# Patient Record
Sex: Female | Born: 1935
Health system: Southern US, Community
[De-identification: ages and names within clinical notes are randomized; demographics above are authoritative.]

## PROBLEM LIST (undated history)

## (undated) DIAGNOSIS — K8020 Calculus of gallbladder without cholecystitis without obstruction: Secondary | ICD-10-CM

## (undated) DIAGNOSIS — I6529 Occlusion and stenosis of unspecified carotid artery: Secondary | ICD-10-CM

## (undated) DIAGNOSIS — K37 Unspecified appendicitis: Secondary | ICD-10-CM

## (undated) DIAGNOSIS — I639 Cerebral infarction, unspecified: Secondary | ICD-10-CM

## (undated) DIAGNOSIS — H548 Legal blindness, as defined in USA: Secondary | ICD-10-CM

## (undated) DIAGNOSIS — I719 Aortic aneurysm of unspecified site, without rupture: Secondary | ICD-10-CM

## (undated) DIAGNOSIS — R011 Cardiac murmur, unspecified: Secondary | ICD-10-CM

## (undated) DIAGNOSIS — K219 Gastro-esophageal reflux disease without esophagitis: Secondary | ICD-10-CM

## (undated) DIAGNOSIS — G459 Transient cerebral ischemic attack, unspecified: Secondary | ICD-10-CM

## (undated) DIAGNOSIS — M1990 Unspecified osteoarthritis, unspecified site: Secondary | ICD-10-CM

## (undated) DIAGNOSIS — N189 Chronic kidney disease, unspecified: Secondary | ICD-10-CM

## (undated) DIAGNOSIS — D689 Coagulation defect, unspecified: Secondary | ICD-10-CM

## (undated) DIAGNOSIS — F419 Anxiety disorder, unspecified: Secondary | ICD-10-CM

## (undated) DIAGNOSIS — I10 Essential (primary) hypertension: Secondary | ICD-10-CM

## (undated) DIAGNOSIS — M810 Age-related osteoporosis without current pathological fracture: Secondary | ICD-10-CM

## (undated) DIAGNOSIS — F329 Major depressive disorder, single episode, unspecified: Secondary | ICD-10-CM

## (undated) DIAGNOSIS — K648 Other hemorrhoids: Secondary | ICD-10-CM

## (undated) DIAGNOSIS — K589 Irritable bowel syndrome without diarrhea: Secondary | ICD-10-CM

## (undated) DIAGNOSIS — H269 Unspecified cataract: Secondary | ICD-10-CM

## (undated) DIAGNOSIS — H6980 Other specified disorders of Eustachian tube, unspecified ear: Secondary | ICD-10-CM

## (undated) DIAGNOSIS — T7840XA Allergy, unspecified, initial encounter: Secondary | ICD-10-CM

## (undated) DIAGNOSIS — F32A Depression, unspecified: Secondary | ICD-10-CM

## (undated) DIAGNOSIS — H6990 Unspecified Eustachian tube disorder, unspecified ear: Secondary | ICD-10-CM

## (undated) HISTORY — DX: Age-related osteoporosis without current pathological fracture: M81.0

## (undated) HISTORY — DX: Coagulation defect, unspecified: D68.9

## (undated) HISTORY — DX: Cerebral infarction, unspecified: I63.9

## (undated) HISTORY — PX: WRIST SURGERY: SHX841

## (undated) HISTORY — DX: Unspecified osteoarthritis, unspecified site: M19.90

## (undated) HISTORY — DX: Unspecified cataract: H26.9

## (undated) HISTORY — PX: CHOLECYSTECTOMY: SHX55

## (undated) HISTORY — PX: SHOULDER SURGERY: SHX246

## (undated) HISTORY — DX: Allergy, unspecified, initial encounter: T78.40XA

## (undated) HISTORY — PX: ABDOMINAL HYSTERECTOMY: SHX81

## (undated) HISTORY — PX: EYE SURGERY: SHX253

## (undated) HISTORY — PX: APPENDECTOMY: SHX54

## (undated) HISTORY — PX: OTHER SURGICAL HISTORY: SHX169

---

## 1997-05-18 ENCOUNTER — Ambulatory Visit (HOSPITAL_BASED_OUTPATIENT_CLINIC_OR_DEPARTMENT_OTHER): Admission: RE | Admit: 1997-05-18 | Discharge: 1997-05-18 | Payer: Self-pay | Admitting: Urology

## 1997-07-04 ENCOUNTER — Ambulatory Visit (HOSPITAL_COMMUNITY): Admission: RE | Admit: 1997-07-04 | Discharge: 1997-07-04 | Payer: Self-pay | Admitting: Internal Medicine

## 1997-10-02 ENCOUNTER — Encounter: Admission: RE | Admit: 1997-10-02 | Discharge: 1997-12-31 | Payer: Self-pay | Admitting: Internal Medicine

## 1998-03-06 ENCOUNTER — Encounter: Payer: Self-pay | Admitting: Internal Medicine

## 1998-03-06 ENCOUNTER — Ambulatory Visit (HOSPITAL_COMMUNITY): Admission: RE | Admit: 1998-03-06 | Discharge: 1998-03-06 | Payer: Self-pay | Admitting: Internal Medicine

## 1998-12-21 ENCOUNTER — Encounter: Payer: Self-pay | Admitting: Internal Medicine

## 1998-12-21 ENCOUNTER — Ambulatory Visit (HOSPITAL_COMMUNITY): Admission: RE | Admit: 1998-12-21 | Discharge: 1998-12-21 | Payer: Self-pay | Admitting: Internal Medicine

## 1999-01-09 ENCOUNTER — Ambulatory Visit (HOSPITAL_COMMUNITY): Admission: RE | Admit: 1999-01-09 | Discharge: 1999-01-09 | Payer: Self-pay | Admitting: Specialist

## 1999-01-09 ENCOUNTER — Encounter: Payer: Self-pay | Admitting: Specialist

## 1999-01-24 ENCOUNTER — Ambulatory Visit (HOSPITAL_COMMUNITY): Admission: RE | Admit: 1999-01-24 | Discharge: 1999-01-24 | Payer: Self-pay | Admitting: Specialist

## 1999-01-24 ENCOUNTER — Encounter: Payer: Self-pay | Admitting: Specialist

## 1999-03-08 ENCOUNTER — Emergency Department (HOSPITAL_COMMUNITY): Admission: EM | Admit: 1999-03-08 | Discharge: 1999-03-08 | Payer: Self-pay | Admitting: Emergency Medicine

## 1999-08-29 ENCOUNTER — Encounter: Admission: RE | Admit: 1999-08-29 | Discharge: 1999-08-29 | Payer: Self-pay | Admitting: Internal Medicine

## 1999-08-29 ENCOUNTER — Encounter: Payer: Self-pay | Admitting: Internal Medicine

## 2000-08-30 ENCOUNTER — Encounter: Admission: RE | Admit: 2000-08-30 | Discharge: 2000-08-30 | Payer: Self-pay | Admitting: Internal Medicine

## 2000-08-30 ENCOUNTER — Encounter: Payer: Self-pay | Admitting: Internal Medicine

## 2001-08-31 ENCOUNTER — Encounter: Payer: Self-pay | Admitting: Internal Medicine

## 2001-08-31 ENCOUNTER — Encounter: Admission: RE | Admit: 2001-08-31 | Discharge: 2001-08-31 | Payer: Self-pay | Admitting: Internal Medicine

## 2002-01-19 ENCOUNTER — Emergency Department (HOSPITAL_COMMUNITY): Admission: EM | Admit: 2002-01-19 | Discharge: 2002-01-19 | Payer: Self-pay | Admitting: Emergency Medicine

## 2002-09-14 ENCOUNTER — Encounter: Admission: RE | Admit: 2002-09-14 | Discharge: 2002-09-14 | Payer: Self-pay | Admitting: Internal Medicine

## 2002-09-14 ENCOUNTER — Encounter: Payer: Self-pay | Admitting: Internal Medicine

## 2003-02-03 HISTORY — PX: ABDOMINAL AORTIC ANEURYSM REPAIR: SHX42

## 2003-03-14 ENCOUNTER — Ambulatory Visit (HOSPITAL_COMMUNITY): Admission: RE | Admit: 2003-03-14 | Discharge: 2003-03-14 | Payer: Self-pay | Admitting: Vascular Surgery

## 2003-04-12 ENCOUNTER — Ambulatory Visit (HOSPITAL_COMMUNITY): Admission: RE | Admit: 2003-04-12 | Discharge: 2003-04-12 | Payer: Self-pay | Admitting: Cardiology

## 2003-04-16 ENCOUNTER — Encounter (INDEPENDENT_AMBULATORY_CARE_PROVIDER_SITE_OTHER): Payer: Self-pay | Admitting: Specialist

## 2003-04-16 ENCOUNTER — Inpatient Hospital Stay (HOSPITAL_COMMUNITY): Admission: RE | Admit: 2003-04-16 | Discharge: 2003-04-27 | Payer: Self-pay | Admitting: Vascular Surgery

## 2003-10-30 ENCOUNTER — Encounter: Admission: RE | Admit: 2003-10-30 | Discharge: 2003-10-30 | Payer: Self-pay | Admitting: Internal Medicine

## 2004-07-31 IMAGING — NM NM MYOCAR MULTI W/ SPECT
1 series · 6 of 6 positions shown · non-contrast
Comparison: Prior thallium SPECT study dated 02/28/06.

CLINICAL DATA: Patient requires preoperative cardiac clearance for abdominal aortic aneurysm repair and resection. 
 NUCLEAR MEDICINE MYOCARDIAL PERFUSION EJECTION FRACTION CALCULATION, WALL MOTION STUDY, AND MULTIPLE SPECT IMAGING WITH REST AND PERSANTINE STRESS

[Series 1: cr cardiolite low dose · 6.79mm/px · 6 of 64 frames shown]
[frame 6/64]
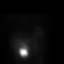
[frame 16/64]
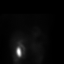
[frame 27/64]
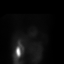
[frame 38/64]
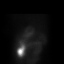
[frame 48/64]
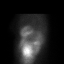
[frame 59/64]
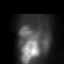

[6 of 6 positions shown; findings below may reference images not displayed]

Radiopharmaceuticals:  10 mCi 77mSc Sestamibi IV at rest and 30 mCi 77mSc Sestamibi IV with Persantine stress. 
 EJECTION FRACTION CALCULATION
 Utilizing gated data, the end diastolic volume is estimated to be 64 cc and the end systolic volume 17 cc.  The calculated ejection fraction is 72 percent. 
 IMPRESSION
 QGS LVEF of 72 percent.
 WALL MOTION ANALYSIS
 The gated study shows normal left ventricular wall motion. 
 IMPRESSION
 Normal wall motion study. 
 MULTIPLE SPECT IMAGING WITH REST AND PERSANTINE STRESS
 Spect images show no evidence of inducible ischemia with administration of Persantine.  No fixed scar is seen. 
 IMPRESSION
 Normal SPECT study demonstrating no evidence of ischemia.

## 2004-08-04 IMAGING — CR DG CHEST 1V PORT
1 series · 1 of 1 positions shown · non-contrast
Comparison: none

CLINICAL DATA: Status post abdominal aortic repair. 
 CHEST, PORTABLE, ONE VIEW
 AP view of the chest made with the portable apparatus on 04/16/2003 is compared with a prior study of 03/14/2003. Since the previous study, Swan-Ganz catheter has been inserted with the tip in the right main pulmonary artery.  Nasogastric tube is thought to be in satisfactory position.  The heart, pulmonary vascularity and lung fields are thought to be unchanged since previous study.  
 IMPRESSION
 Swan-Ganz catheter is thought to be in satisfactory position without evidence of pneumothorax or other acute changes in the chest.

[view not recorded]
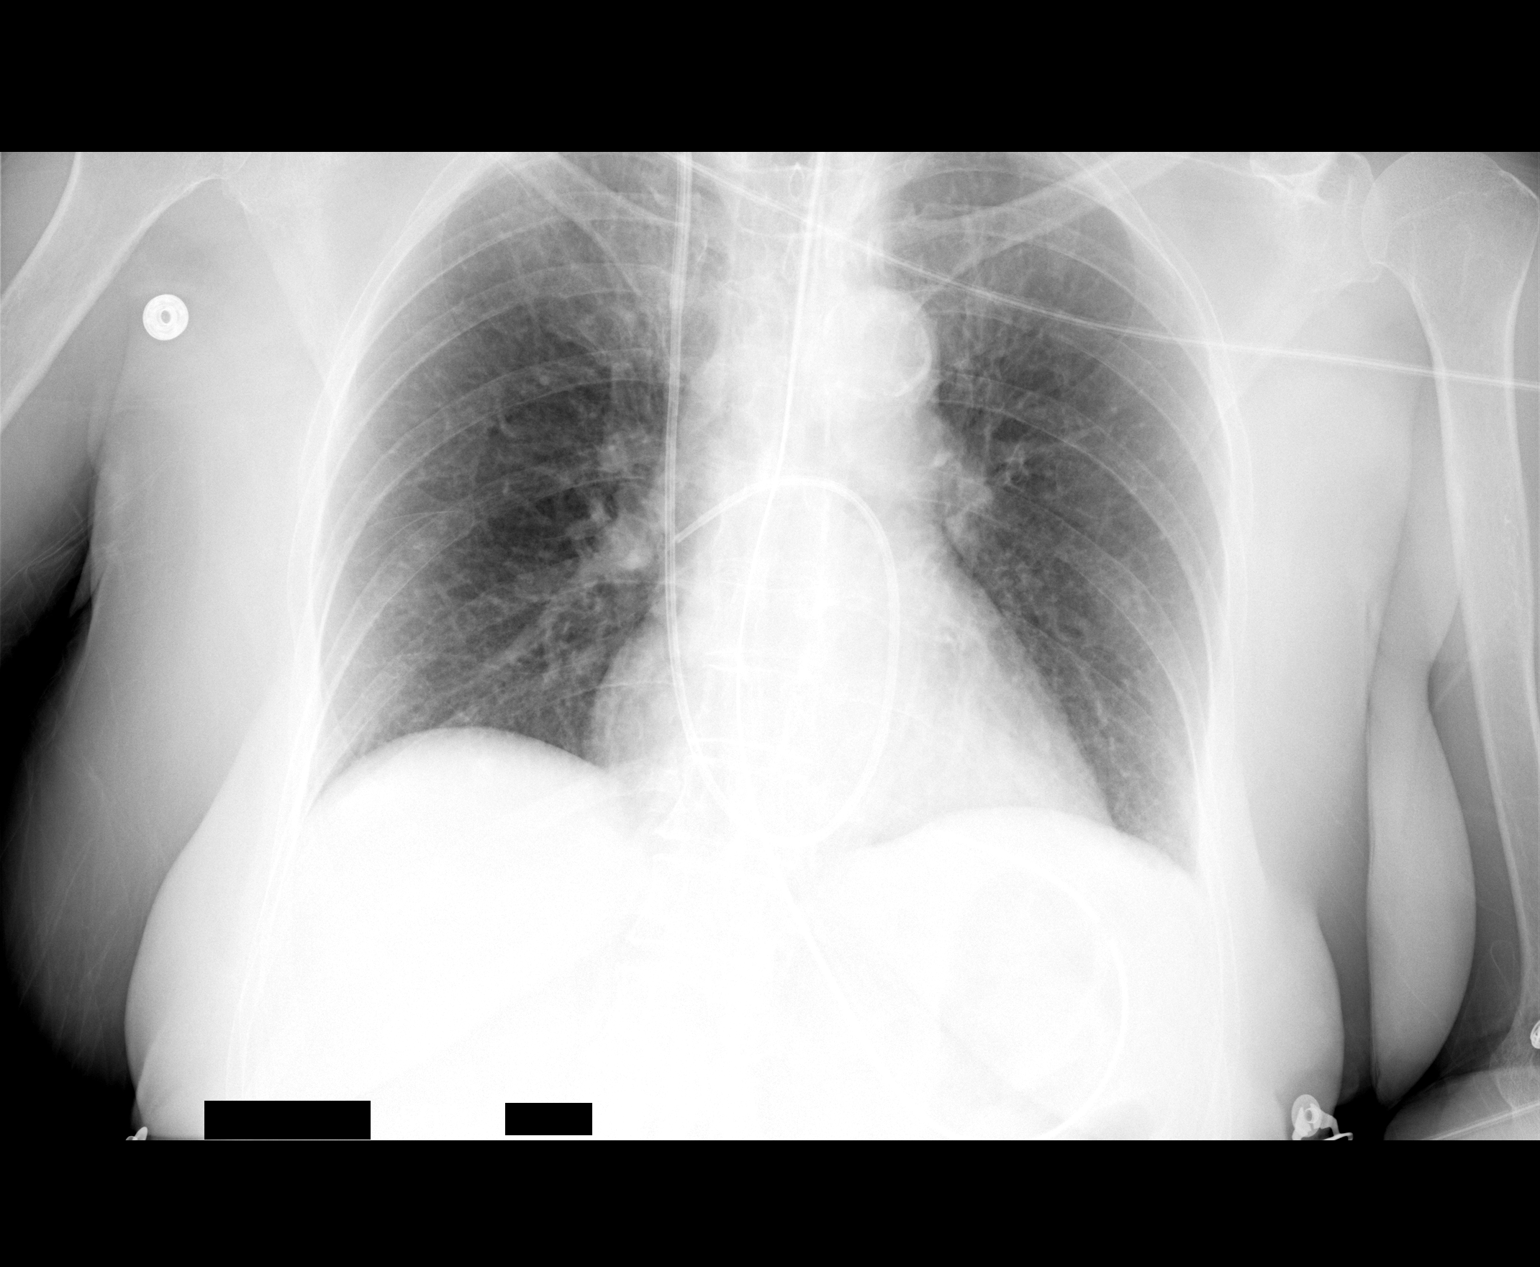

[1 of 1 positions shown; findings below may reference images not displayed]

## 2004-08-08 IMAGING — CR DG ABDOMEN 1V
1 series · 1 of 1 positions shown · non-contrast
Comparison: 04/19/03.

CLINICAL DATA: 76-year-old female, abdominal aortic aneurysm repair.  
 SINGLE VIEW ABDOMEN

[view not recorded]
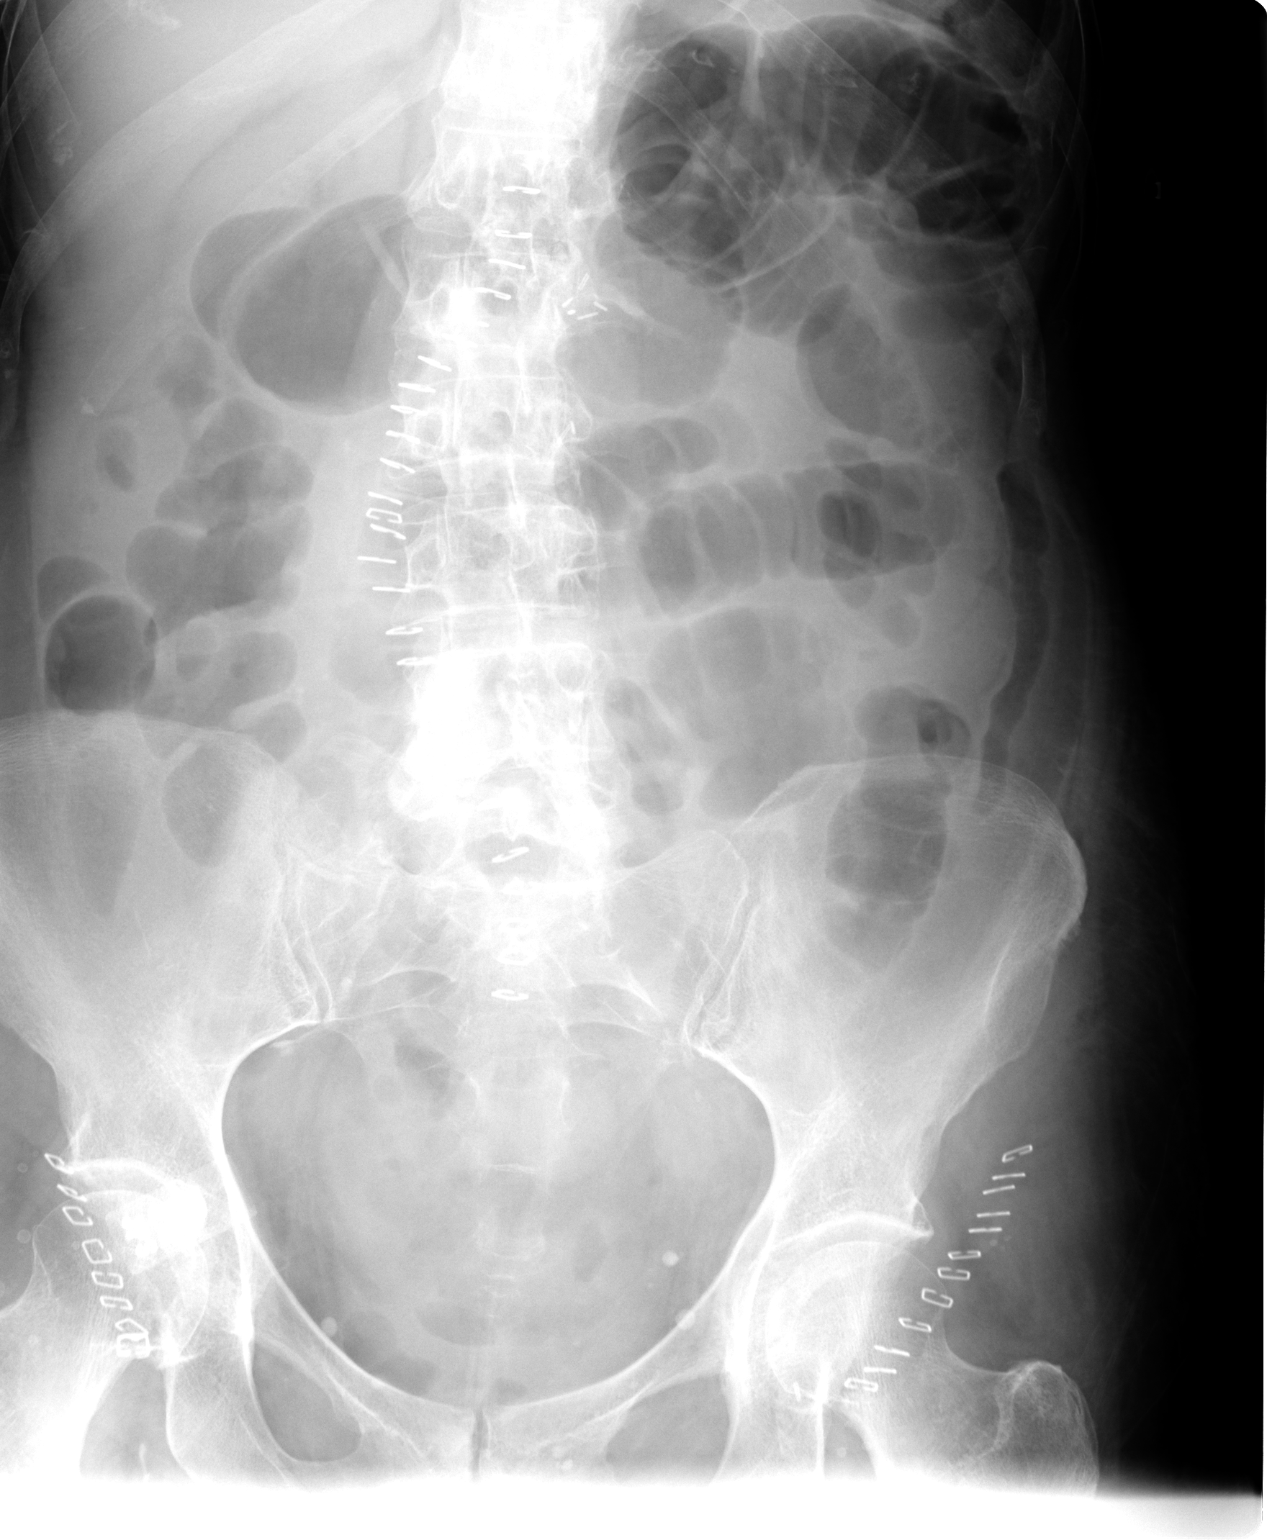

[1 of 1 positions shown; findings below may reference images not displayed]

FINDINGS: Portable view of the abdomen on 04/20/03, [DATE].
 There is mild gaseous distention of the small bowel.  Air is evident in the colon on the right side.  Left colonic air is faintly visible.  The left colon remains decompressed.  Overall, the appearance of the gaseous distention is similar to the supine radiograph of 04/19/03.
IMPRESSION: Mild gaseous distention of small and large bowel, probably consistent with a mild ileus.

## 2004-11-19 ENCOUNTER — Encounter: Admission: RE | Admit: 2004-11-19 | Discharge: 2004-11-19 | Payer: Self-pay | Admitting: Internal Medicine

## 2005-11-23 ENCOUNTER — Encounter: Admission: RE | Admit: 2005-11-23 | Discharge: 2005-11-23 | Payer: Self-pay | Admitting: Internal Medicine

## 2007-01-18 ENCOUNTER — Encounter: Admission: RE | Admit: 2007-01-18 | Discharge: 2007-01-18 | Payer: Self-pay | Admitting: Internal Medicine

## 2007-02-28 ENCOUNTER — Ambulatory Visit (HOSPITAL_COMMUNITY): Admission: RE | Admit: 2007-02-28 | Discharge: 2007-02-28 | Payer: Self-pay | Admitting: Internal Medicine

## 2007-04-28 ENCOUNTER — Encounter (INDEPENDENT_AMBULATORY_CARE_PROVIDER_SITE_OTHER): Payer: Self-pay | Admitting: General Surgery

## 2007-04-28 ENCOUNTER — Ambulatory Visit (HOSPITAL_COMMUNITY): Admission: RE | Admit: 2007-04-28 | Discharge: 2007-04-29 | Payer: Self-pay | Admitting: General Surgery

## 2007-10-26 ENCOUNTER — Ambulatory Visit (HOSPITAL_COMMUNITY): Admission: RE | Admit: 2007-10-26 | Discharge: 2007-10-26 | Payer: Self-pay | Admitting: *Deleted

## 2007-10-26 ENCOUNTER — Encounter (INDEPENDENT_AMBULATORY_CARE_PROVIDER_SITE_OTHER): Payer: Self-pay | Admitting: *Deleted

## 2008-02-27 ENCOUNTER — Encounter: Admission: RE | Admit: 2008-02-27 | Discharge: 2008-02-27 | Payer: Self-pay | Admitting: Internal Medicine

## 2008-03-05 ENCOUNTER — Encounter: Admission: RE | Admit: 2008-03-05 | Discharge: 2008-03-05 | Payer: Self-pay | Admitting: Internal Medicine

## 2008-08-23 ENCOUNTER — Encounter: Admission: RE | Admit: 2008-08-23 | Discharge: 2008-08-23 | Payer: Self-pay | Admitting: *Deleted

## 2008-09-17 ENCOUNTER — Encounter: Admission: RE | Admit: 2008-09-17 | Discharge: 2008-09-17 | Payer: Self-pay | Admitting: Internal Medicine

## 2010-01-06 IMAGING — US US ABDOMEN COMPLETE
1 series · 13 of 25 positions shown · non-contrast
Comparison: CT 08/23/2008.  Ultrasound 02/28/2007

CLINICAL DATA: Followup hepatic cyst.

COMPLETE ABDOMINAL ULTRASOUND

[Series 1: us abdomen complete · 0.24mm/px · 13 of 72 slices shown]
[im 1/72]
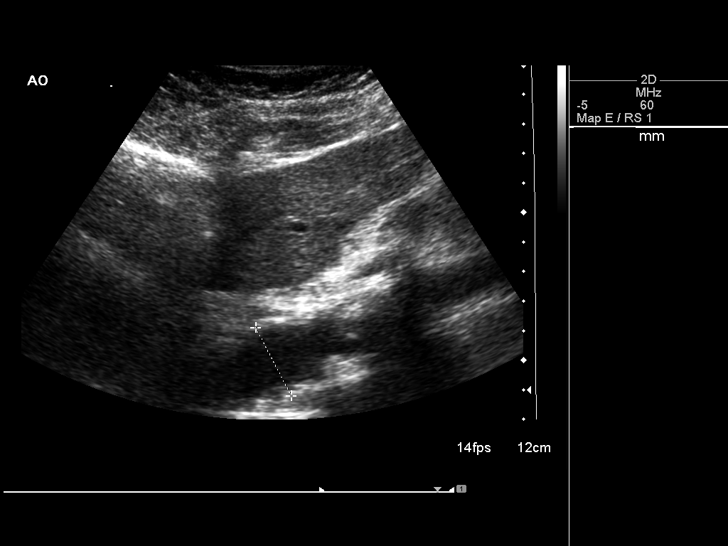
[im 6/72]
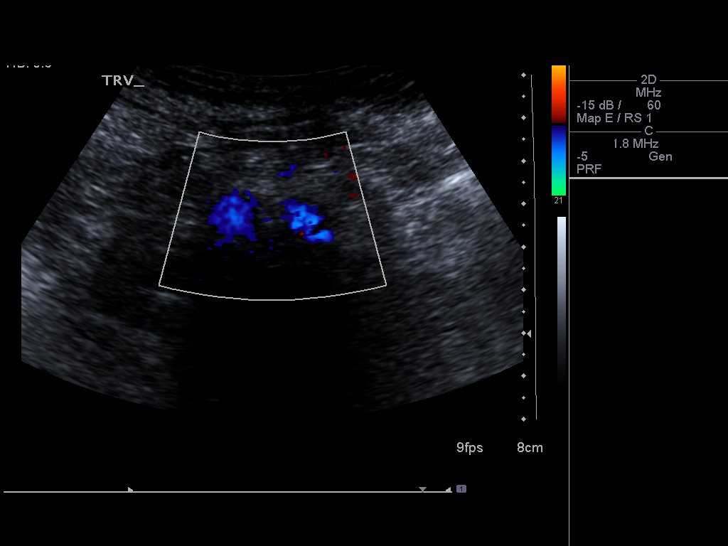
[im 12/72]
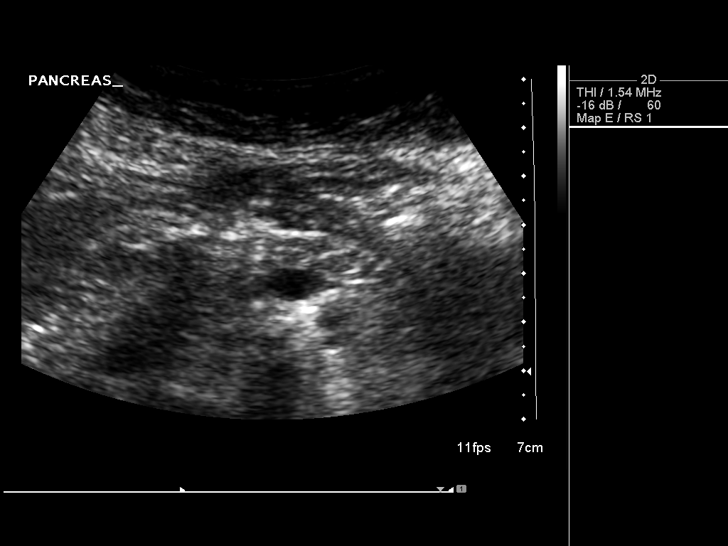
[im 18/72]
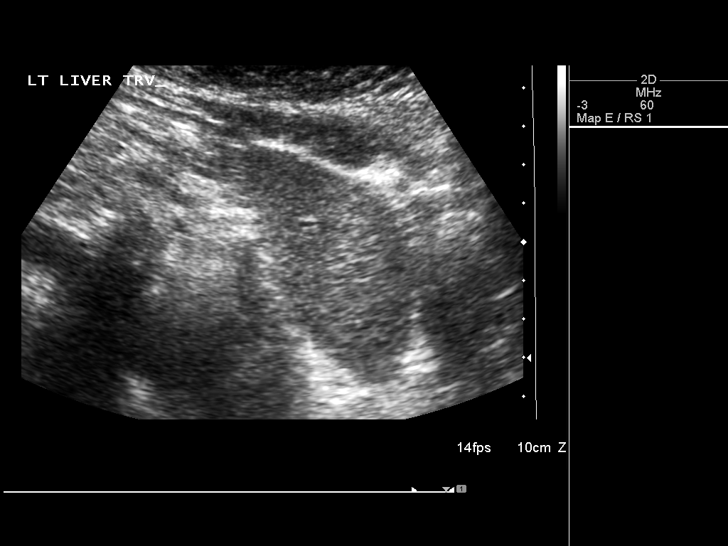
[im 24/72]
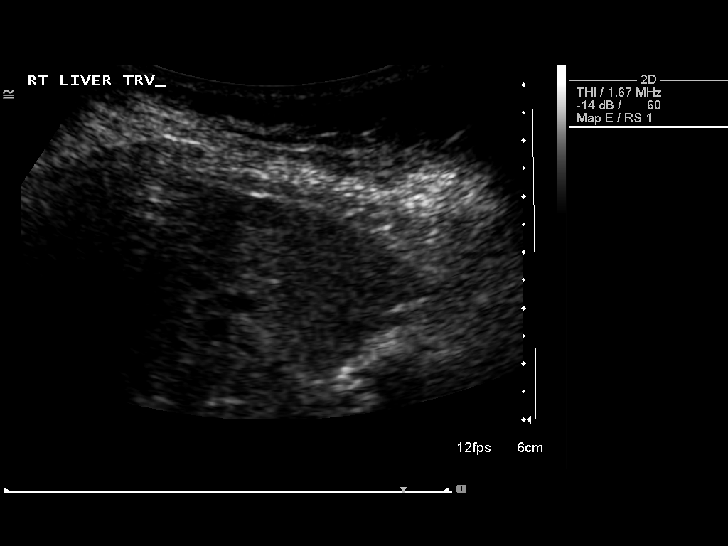
[im 30/72]
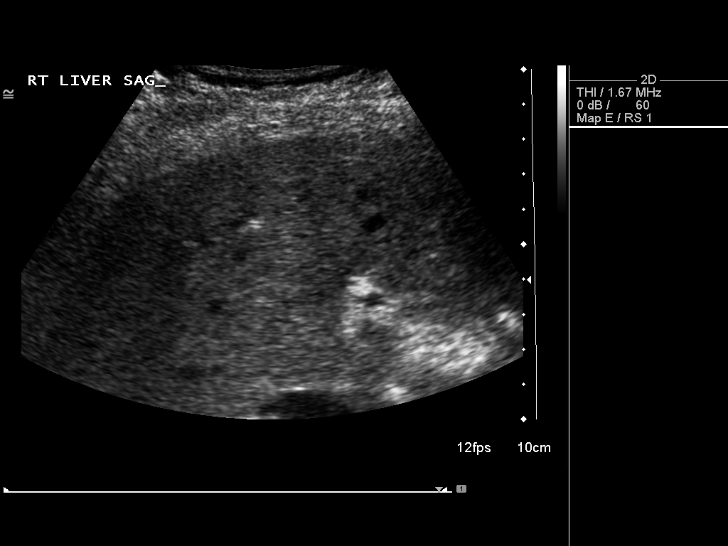
[im 36/72]
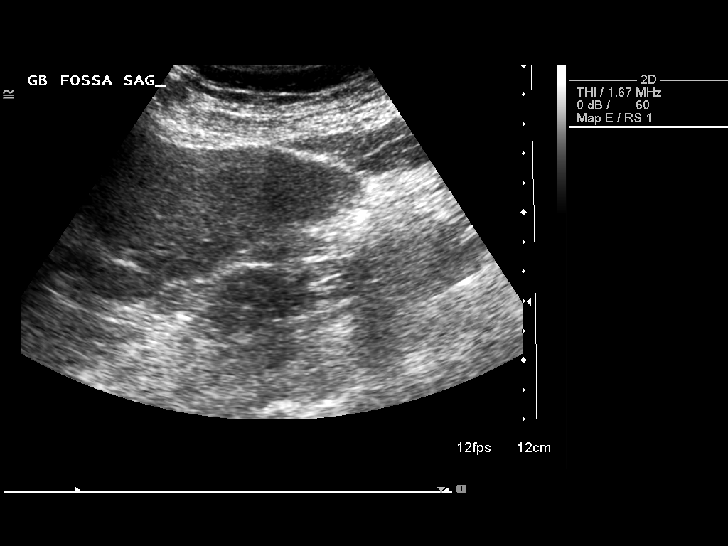
[im 42/72]
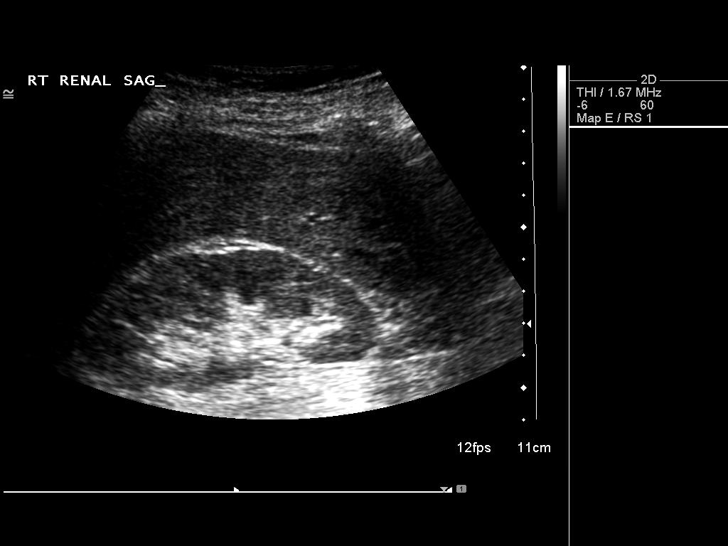
[im 48/72]
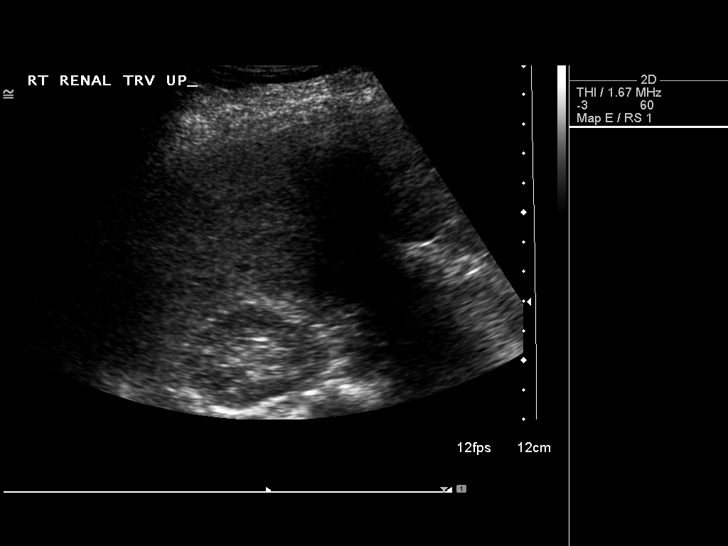
[im 54/72]
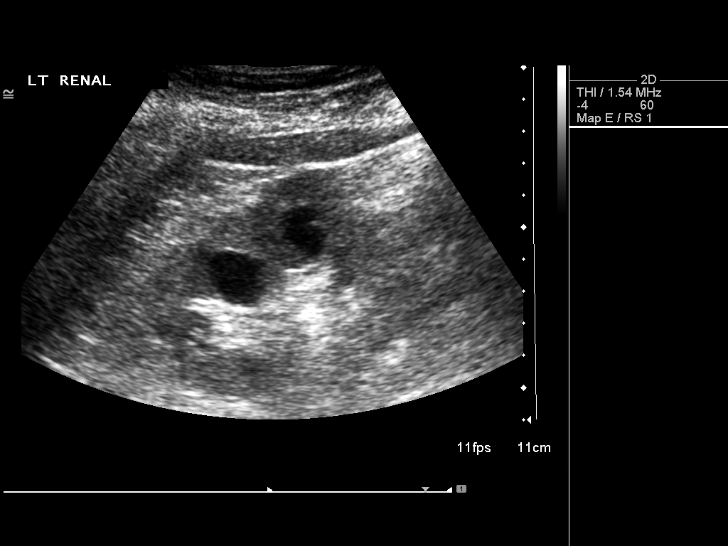
[im 60/72]
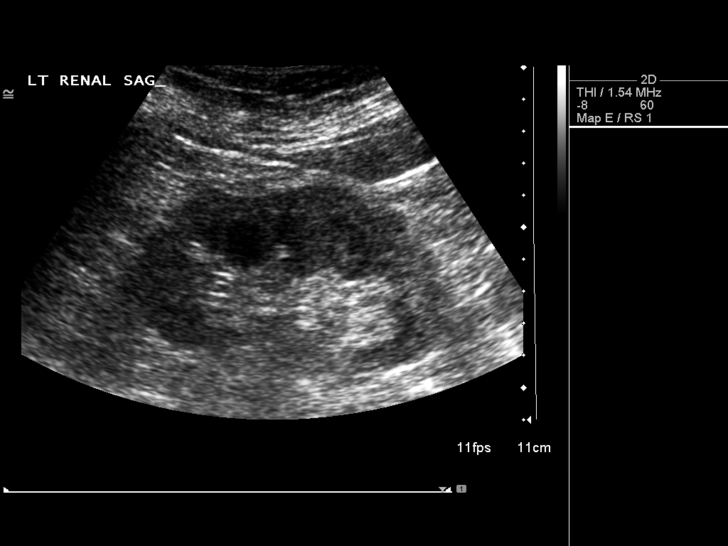
[im 66/72]
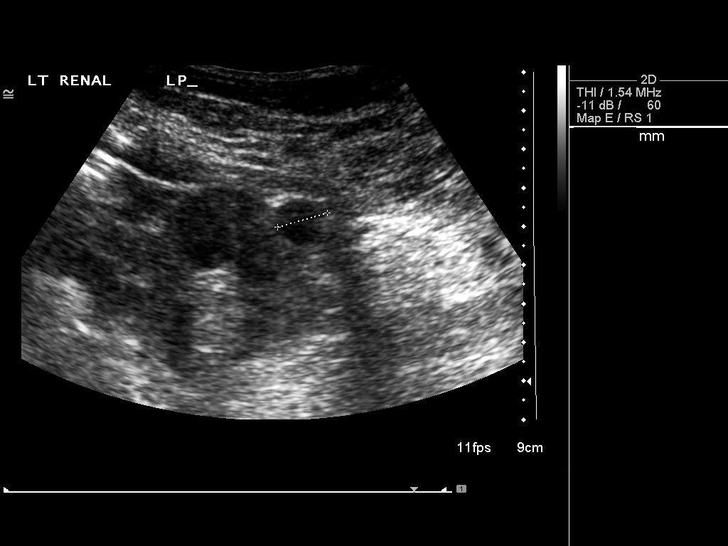
[im 72/72]
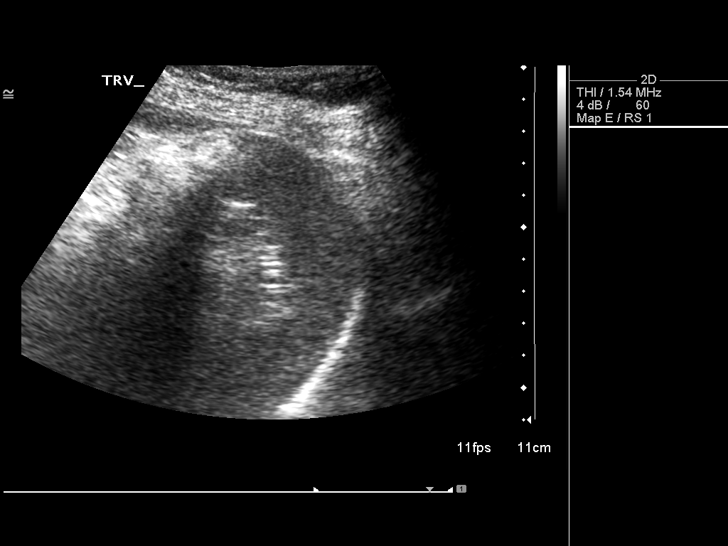

[13 of 25 positions shown; findings below may reference images not displayed]

FINDINGS: Gallbladder:  Surgically removed.

Common bile duct:  7.8 mm. Upon reviewing the prior CT, possibility
of a 6.1 mm distal common bile duct stone is raised.  This is not
appreciated on the present exam.

Liver:  1 x 0.8 x 0.8 cm cyst anterior lateral segment left lobe.

IVC:  Negative.

Pancreas:  Portions not visualized secondary to overlying bowel
gas.  The portions visualized appear unremarkable.

Spleen:  4.2 cm.  No focal mass.

Right Kidney:  9.4 cm.  1.8 x 1.8 x 1.6 cm mid to lower pole cyst.
Tiny calcifications along the periphery suspected.

Left Kidney:  10.4 cm.  Three left-sided renal cysts noted
measuring up to 1.7, 1.5 and 1.3 cm.  Small septation within mid
pole cyst and minimal increased echogenicity within the lower pole
cyst.

Abdominal aorta:  Status post abdominal aortic aneurysm repair.
Atherosclerotic type changes with ectasia.  Maximal AP dimension
2.6 cm.
IMPRESSION: Upon reviewing the prior CT, possibility of a 6.1 mm distal common
bile duct stone is raised.  This is not appreciated noneon the
present exam.

1 cm liver cyst.

Bilateral renal cysts as noted above.

## 2010-02-24 ENCOUNTER — Encounter: Payer: Self-pay | Admitting: General Surgery

## 2010-03-27 ENCOUNTER — Other Ambulatory Visit (INDEPENDENT_AMBULATORY_CARE_PROVIDER_SITE_OTHER): Payer: Medicare Other

## 2010-03-27 DIAGNOSIS — R0989 Other specified symptoms and signs involving the circulatory and respiratory systems: Secondary | ICD-10-CM

## 2010-03-28 ENCOUNTER — Other Ambulatory Visit: Payer: Self-pay | Admitting: Internal Medicine

## 2010-03-28 DIAGNOSIS — R52 Pain, unspecified: Secondary | ICD-10-CM

## 2010-04-01 ENCOUNTER — Ambulatory Visit
Admission: RE | Admit: 2010-04-01 | Discharge: 2010-04-01 | Disposition: A | Discharge status: Home / Self Care | Payer: Medicare Other | Source: Ambulatory Visit | Attending: Internal Medicine | Admitting: Internal Medicine

## 2010-04-01 DIAGNOSIS — R52 Pain, unspecified: Secondary | ICD-10-CM

## 2010-04-01 NOTE — Procedures (Unsigned)
CAROTID DUPLEX EXAM  INDICATION:  Bilateral carotid bruits.  HISTORY: Diabetes:  No Cardiac:  No Hypertension:  Yes Smoking:  Yes Previous Surgery:  No CV History:  The patient is currently asymptomatic. Amaurosis Fugax No, Paresthesias No, Hemiparesis No                                      RIGHT             LEFT Brachial systolic pressure:         180               182 Brachial Doppler waveforms:         WNL               WNL Vertebral direction of flow:        antegrade         appears antegrade DUPLEX VELOCITIES (cm/sec) CCA peak systolic                   88                82 ECA peak systolic                   94                82 ICA peak systolic                   105               112 ICA end diastolic                   29                24 PLAQUE MORPHOLOGY:                  heterogeneous     heterogeneous PLAQUE AMOUNT:                      moderate          moderate PLAQUE LOCATION:                    ICA, ECA and CCA  ICA, ECA and CCA  IMPRESSION: 1. Right side:  No hemodynamically significant stenosis. 2. Left side with 1-39% stenosis  ( top end of range) . 3. Bilateral intimal thickening within the common carotid arteries. 4. Difficulty visualizing the left vertebral.  This could be due to     occlusion.  ___________________________________________ Larina Earthly, M.D.  OD/MEDQ  D:  03/27/2010  T:  03/27/2010  Job:  [id number]

## 2010-06-10 ENCOUNTER — Ambulatory Visit (HOSPITAL_COMMUNITY): Payer: Medicare Other | Attending: Internal Medicine

## 2010-06-10 DIAGNOSIS — M810 Age-related osteoporosis without current pathological fracture: Secondary | ICD-10-CM | POA: Insufficient documentation

## 2010-06-17 NOTE — Op Note (Signed)
Allison [name], Allison [name] NO.:  [id number]   MEDICAL RECORD NO.:  [id number]          PATIENT TYPE:  AMB   LOCATION:  ENDO                         FACILITY:  [facility]   PHYSICIAN:  Georgiana Spinner, M.D.    DATE OF BIRTH:  Dec 11, 1926   DATE OF PROCEDURE:  10/26/2007  DATE OF DISCHARGE:                               OPERATIVE REPORT   PROCEDURE:  Colonoscopy.   INDICATIONS:  Rectal bleeding.   ANESTHESIA:  Fentanyl 100 mcg, Versed 10 mg.   PROCEDURE IN DETAIL:  With the patient mildly sedated in the left  lateral decubitus position the Pentax videoscopic pediatric colonoscope  was inserted in the rectum, passed under direct vision, with pressure  applied we reached the cecum identified by ileocecal valve and  appendiceal orifice both of which were photographed.  From this point  the colonoscope was slowly withdrawn taking circumferential views of  colonic mucosa stopping at 20 cm from the anal verge at which point a  polyp was seen, photographed and removed using hot biopsy forceps  technique setting of 20/150 blended current.  We pulled back to the  rectum which appeared normal on direct and showed hemorrhoids on  retroflexed view.  The endoscope was straightened and withdrawn.  The  patient's vital signs, pulse oximeter remained stable.  The patient  tolerated procedure well without apparent complications.   FINDINGS:  Internal hemorrhoids, small polyp at 20 cm from anal verge.  Await biopsy report.  The patient will call me for results and follow up  with me as an outpatient.           ______________________________  Georgiana Spinner, M.D.     GMO/MEDQ  D:  10/26/2007  T:  10/26/2007  Job:  [id number]

## 2010-06-17 NOTE — Op Note (Signed)
NAMENONNIE, PICKNEY                ACCOUNT NO.:  [id number]   MEDICAL RECORD NO.:  [id number]          PATIENT TYPE:  OIB   LOCATION:  2550                         FACILITY:  [facility]   PHYSICIAN:  Sharlet Salina T. Hoxworth, M.D.DATE OF BIRTH:  1926/03/20   DATE OF PROCEDURE:  04/28/2007  DATE OF DISCHARGE:                               OPERATIVE REPORT   PREOPERATIVE DIAGNOSIS:  Symptomatic cholelithiasis   POSTOPERATIVE DIAGNOSIS:  Symptomatic cholelithiasis   SURGICAL PROCEDURES:  Laparoscopic cholecystectomy.   SURGEON:  Dr. Johna Sheriff.   ASSISTANT:  Dr. Claud Kelp.   ANESTHESIA:  General.   BRIEF HISTORY:  Ms. Whack is a 77 year old female with multiple medical  problems and history of abdominal aortic aneurysm repair several years  ago.  She now presents with a several-month history of worsening  episodic epigastric pressure discomfort radiation the right flank and  back associated with nausea.  Workup has included a gallbladder  ultrasound showing gallstones.  Due to persistent worsening symptoms  which appeared to be related to her gallbladder, we have recommended  proceeding with laparoscopic and possible open cholecystectomy.  Nature  of procedure, indications, risks of bleeding, infection, bile leak, bile  duct injury, and higher risk for open procedure to her previous  abdominal surgery were discussed and understood.  Patient now brought to  the operating room for this procedure.   DESCRIPTION OF OPERATION:  The patient was brought to the operating  room, placed supine position on the table, and general endotracheal  anesthesia was induced.  The abdomen was widely sterilely prepped and  draped.  She received preoperative antibiotics.  PAS were in place.  Correct patient and procedure were verified.  Access was obtained with a  5-mm incision in the right subcostal space midclavicular line with a 5  mm OptiVu trocar without difficulty and pneumoperitoneum established.  There were no adhesions in the right upper quadrant.  There were some  adhesions along the midline for previous incision.  Under direct vision,  an 11-mm trocar was placed in the subxiphoid area.  Another 5-mm trocar  was then placed in the right flank.  Through the two 5-mm sites, omental  adhesions were taken off the anterior abdominal wall over toward the  midline until we got to the midline at the umbilicus, and at this point,  an 11-mm trocar was placed under direct vision.  The gallbladder was not  acutely inflamed.  This fundus was grasped and elevated up over the  liver and the infundibulum retracted inferolaterally.  Peritoneum  anterior posterior to Calot's triangle was incised and fibrofatty tissue  stripped off the neck of the gallbladder toward the porta hepatis.  Closed triangle was completely skeletonized.  A critical view was  obtained with the cystic artery skeletonized in Calot's triangle and the  cystic duct dissected out over about a centimeter and the cystic  duct/gallbladder junction dissected 360 degrees.  The anatomy appeared  completely clear.  The patient had normal LFTs, and because of a  reported very significant allergy to iodine and seafood, I elected to  forego the cholangiogram.  The cystic artery was doubly clipped  proximally, clipped distally as was the cystic duct.  The gallbladder  and dissected free from its bed using hook cautery.  It was placed  through an EndoCatch bag and then brought out through the periumbilical  incision.  Complete hemostasis was assured in  the operative site.  I  dilated the periumbilical incision slightly with my finger to get the  gallbladder out, and then I was able to feel a hernia at the umbilicus.  I was not sure of my incision was directly through this.  This appeared  to be from her previous abdominal aortic aneurysm repair.  In order to  expose this better, I extended the periumbilical incision over across  the  midline for about 2 cm, and then the entire defect was well  visualized which measured about 2 cm in diameter, was actually medial to  where my trocar had gone through.  These edges were defined and then  closed with interrupted 0 Prolene.  An additional 0 Prolene suture was  placed through the trocar site.  Following this, skin incisions were  closed with subcuticular 5-0 Monocryl and Steri-Strips.  Sponge, needle,  and instrument counts correct.  The patient was taken to recovery in  good condition.      Lorne Skeens. Hoxworth, M.D.  Electronically Signed     BTH/MEDQ  D:  04/28/2007  T:  04/28/2007  Job:  [id number]

## 2010-06-20 NOTE — Op Note (Signed)
NAME:  ADRIAN, SPECHT                        ACCOUNT NO.:  [id number]   MEDICAL RECORD NO.:  [id number]                   PATIENT TYPE:  OIB   LOCATION:  2899                                 FACILITY:  [facility]   PHYSICIAN:  Larina Earthly, M.D.                 DATE OF BIRTH:  04/03/1926   DATE OF PROCEDURE:  03/14/2003  DATE OF DISCHARGE:  03/14/2003                                 OPERATIVE REPORT   PREOPERATIVE DIAGNOSIS:  Right lower extremity arterial insufficiency.   POSTOPERATIVE DIAGNOSIS:  Right lower extremity arterial insufficiency.  Severe right renal artery stenosis.   PROCEDURE:  Aortograms, bilateral lower extremity runoff.   SURGEON:  Larina Earthly, M.D.   ANESTHESIA:  1% lidocaine, local.   COMPLICATIONS:  None.   DISPOSITION:  To recovery room stable.   PROCEDURE IN DETAIL:  The patient was taken to the peripheral vascular cath  lab and placed in the supine position where the area of both groins was  prepped and draped in the usual sterile fashion.  Using local anesthesia and  the single wall puncture, the left common femoral artery was entered, and a  guide wire was passed up to the level of the suprarenal aorta.  A 5 French  sheath was passed over the guide wire.  The pigtail catheter was then  positioned at the level of the suprarenal aorta, and AP projections were  undertaken.  This revealed a small to moderate size infrarenal abdominal  aortic aneurysm.  There was a severe, 95% stenosis at the right renal artery  and moderate 50% stenosis of the left renal artery.  An LAO projection was  also undertaken for better visualization of this.  The patient had extensive  calcifications and aortoiliac occlusive disease.  The catheter was then  withdrawn down into the aneurysm sac, and again, AP and pelvic projections  were undertaken.  This revealed subtotal occlusion of the common iliac  artery on the left with a tight stenosis at the common iliac artery at the  hypogastric artery takeoff.  On the right, there was moderate stenosis at  the takeoff of the aorta.  There were large internal iliac arteries  bilaterally.  The external iliac arteries were small but had no evidence of  flow-limiting stenosis.  Runoff was also obtained with the same catheter and  revealed widely patent superficial femoral arteries bilaterally.  The  popliteal arteries were also patent without evidence of flow-limiting  stenosis.  There was three-vessel runoff bilaterally with the anterior  tibial being the dominant run-off vessel bilaterally.  The patient tolerated  the procedure without immediate complications and was returned to the  holding area in a stable condition.   FINDINGS:  1. Severe left renal artery and moderate right renal artery stenosis.  2. Mild to moderate infrarenal abdominal aortic aneurysm.  3. 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  4. Patent infrainguinal vessels with no evidence of infrainguinal arterial     insufficiency.                                               Larina Earthly, M.D.    TFE/MEDQ  D:  03/17/2003  T:  03/17/2003  Job:  [id number]

## 2010-06-20 NOTE — Discharge Summary (Signed)
NAME:  Allison [name], Allison [name]                        ACCOUNT NO.:  [id number]   MEDICAL RECORD NO.:  [id number]                   PATIENT TYPE:  INP   LOCATION:  2019                                 FACILITY:  [facility]   PHYSICIAN:  Larina Earthly, M.D.                 DATE OF BIRTH:  05-Nov-1926   DATE OF ADMISSION:  04/16/2003  DATE OF DISCHARGE:                                 DISCHARGE SUMMARY   ADMISSION DIAGNOSIS:  Activity limiting claudication secondary to aortoiliac  occlusive disease as well as small-to-moderate abdominal aortic aneurysm and  severe right renal artery stenosis.   PAST MEDICAL HISTORY:  1. Abdominal aortic aneurysm.  2. Hypertension.  3. Severe depression which is treated by Dr. Elna Breslow.  4. Interstitial cystitis followed by Dr. Logan Bores with no recent flare-ups.  5. Irritable bowel syndrome with no recent flare-ups.  6. Eustachian tube dysfunction with decreased hearing in her right ear.   PAST SURGICAL HISTORY:  1. Appendectomy at 19.  2. Hysterectomy in 1980.  3. Nephrolithotomy, 1982.  4. Shoulder surgery for calcium deposits, 1994.  5. Wrist surgery, 1995.   ALLERGIES:  1. AMPICILLIN which causes total body rash and malaise.  2. LEVAQUIN which causes erythematous total body rash.  3. DEMEROL which causes severe agitation and tachycardia as does DILAUDID.  4. IVP DYE which causes rash and malaise.  5. INDERAL.  6. NOVOCAINE which causes shortness of breath and severe dizziness.  7. XYLOCAINE which causes localized swelling, rash and erythema.  8. PAMELOR which causes tremors.  9. Intolerance to [facility] which causes elevated blood pressure.  10.      ERYTHROMYCIN, BIAXIN, ROBAXIN, CELEBREX, FOSAMAX, ZOLOFT which all     cause nausea and severe GI discomfort.  11.      PAXIL which causes drowsiness.  12.      ELAVIL which causes agitation, nausea, dysphoria as well as     TRIAVIL.  13.      NEGRAM which causes visual disturbances.   DISCHARGE  DIAGNOSES:  Aortoiliac occlusive disease, abdominal aortic  aneurysm, and 95% right renal artery stenosis status post aortobifemoral  bypass and right renal artery bypass.   BRIEF HISTORY:  This is a 77 year old white female who returned to the CVTS  office on March 01, 2003 for continued evaluation of her left lower  extremity claudication.  She reported at that time that her symptoms were  activity limiting.  AVIs were performed at the CVTS office that day and  revealed greater than 1.0 on the right and 0.84 on the left.  Dr. Arbie Cookey  recommended arteriography for further evaluation.  Prior to that procedure,  she was pretreated with steroids and Benadryl secondary to her history of  IVP dye allergy.  Angiogram was performed on March 14, 2003 and revealed  moderate left and severe right renal artery stenosis, small infrarenal  abdominal aortic aneurysm, extensive bifurcation  disease with subtotal  occlusion of the left common femoral artery and the bifurcation and tight  stenosis distally, and significant stenosis in the right common iliac  artery.  The patient returned to the CVTS office on March 29, 2003.  Due  to the fact that the patient's symptoms were not tolerable, and she could  not perform her AVIs without severe claudication of the left leg, Dr. Arbie Cookey  recommended surgical intervention to include aortofemoral bypass and right  renal artery bypass.  Prior to proceeding with the surgery, Dr. Arbie Cookey  recommended further evaluation by Dr. Renne Crigler.  Dr.  Renne Crigler recommended a  Cardiolite stress test to evaluate her cardiac status.  This was performed  on April 12, 2003 by Dr. Aleen Campi.  The test was negative, and she was  cleared for surgery.   HOSPITAL COURSE:  The patient was admitted and taken to the OR on April 16, 2003 for an aortobifemoral bypass with a 15-x-8 Hemashield graft and right  renal artery bypass with 6-mm Hemashield graft.  The patient tolerated the  procedure  well and was hemodynamically stable postoperatively.  The patient  was extubated without complication and woke up from anesthesia  neurologically intact.  The patient was transferred from the OR to the SICU  in stable condition.   The patient was doing well postoperative day one.  She was in stable  condition.  She was afebrile, and her vital signs were stable.  Her only  complaint was abdominal soreness.  The abdomen was soft and there were  positive bowel sounds.  There were 2+ dorsalis pedis pulses present  bilaterally.  The NG tube was discontinued and the patient was restarted on  her depression medications.  The patient was also mobilized.  The patient  was transferred to Unit 2000 on postoperative day two without complications.  She was again afebrile, and the vital signs were stable.  The patient was  tolerating liquids at that time with no nausea or vomiting.  Her abdomen was  soft and there were positive bowel sounds noted.  The patient's diet and  activity were increased at that time.   The morning of postoperative day three, the patient vomited approximately  300 ml  of bilious emesis.  The patient was made NPO again and abdominal  upright and flat x-rays were ordered.  The patient was again noted to vomit  on the evening of postoperative day three.  The patient stated that she felt  better after she vomited.  Abdominal x-rays revealed large and small bowel  ileus.  The patient was kept NPO at that time.  On postoperative day four,  the patient was afebrile, and the vital signs were stable.  She continued to  complain of reflux-type symptoms.  She was having bowel movements at that  time and passing flatus.  The abdomen was tender, slightly distended, and  there were positive bowel sounds present.  The patient again vomited on  postoperative day four, approximately 1000 ml of greenish bilious liquid.  The patient was given Zofran and the NG tube was replaced.   Postoperative day five, the patient was without nausea and vomiting.  She had several  bowel movements and was passing flatus.  She felt much better.  The abdomen  was soft, slightly tender and slightly distended.  There were positive bowel  sounds.  The incision was healing well, and there was a 2+ posterior tibial  pulse in bilateral lower extremities.  The bowel  rest was continued at that  time.  The patient's NG tube was again discontinued on postoperative day  six.  The remainder of the patient's postoperative course has progressed as  expected.  She has remained in stable condition.  The patient has not had  any further nausea or vomiting.  The patient is having bowel movements  without complications, and she is voiding without complications.   On postoperative day nine, the patient was without complaint.  She was  tolerating a full diet and voiding without complications.  The patient was  afebrile.  The vital signs were stable with a blood pressure of 134/85,  heart rate of 71, O2 saturation of 92% on room air.  On physical exam,  cardiac was regular rate and rhythm with a 2/6 systolic murmur.  Lungs were  clear to auscultation bilaterally.  The abdomen was soft, nontender and  nondistended with positive bowel sounds and the patient was feeling well.  In the extremities, there was no edema, and there was 2+ posterior pulses  bilaterally.  The patient was doing well at this time.  She continued to  mobilize and tolerated this well.  As long as the patient continues to  progress in the current manner, she will be stable for discharge in the next  1-2 days.   LABORATORY DATA:  CBC and BMP on April 24, 2003:  White count 8.2,  hemoglobin 8.2, hematocrit 23.7, platelets 339, sodium 141, potassium 4.1,  BUN 3, creatinine 0.7, glucose 109.   CONDITION ON DISCHARGE:  Improved.   INSTRUCTIONS:  Patient is to resume her home medications which include  Benicar 40 mg daily, Pravachol 40 mg  daily, aspirin 81 mg daily, Lexapro 5  mg daily, Zyprexa 5 mg daily, Klonopin 0.5 mg q.h.s.  The patient is to take  Colace 100 mg 2 tablets p.o. daily.  Pain management:  Tylox 1-2 p.o. q.4-6h  p.r.n. pain.  Activity:  No driving, no strenuous activity, and the patient  is continue to do her breathing and walking exercises.  Diet:  Low salt diet  and cholesterol.  Wound Care:  Patient is to shower daily and clean the  incision with soap and water.  If the incision becomes red, swollen or  drains or if the patient has fever of 101 she is to call the CVTS office.  The patient has a staple removal appointment on April 30, 2003 at 10:00 a.m.  in the CVTS office.  She has a followup appointment with Dr. Arbie Cookey on May 17, 2003 at 2:00.  The patient will have AVIs performed at that appointment.      Pecola Leisure, [place]                      Larina Earthly, M.D.    AY/MEDQ  D:  04/25/2003  T:  04/26/2003  Job:  [id number]   cc:   Soyla Murphy. Renne Crigler, M.D.  [address]  [place] [address]  Fax: [phone]

## 2010-06-20 NOTE — Op Note (Signed)
NAME:  Allison [name], Allison [name]                        ACCOUNT NO.:  [id number]   MEDICAL RECORD NO.:  [id number]                   PATIENT TYPE:  INP   LOCATION:  2312                                 FACILITY:  [facility]   PHYSICIAN:  Larina Earthly, M.D.                 DATE OF BIRTH:  Jun 17, 1926   DATE OF PROCEDURE:  04/16/2003  DATE OF DISCHARGE:                                 OPERATIVE REPORT   PREOPERATIVE DIAGNOSIS:  1. Aortoiliac occlusive disease.  2. Small abdominal aortic aneurysm.  3. Critical stenosis of right renal artery.   POSTOPERATIVE DIAGNOSIS:  1. Aortoiliac occlusive disease.  2. Small abdominal aortic aneurysm.  3. Critical stenosis of right renal artery.   PROCEDURE:  Aortobifemoral bypass with a 16 by 8 Hemashield graft and right  renal artery bypass with 6 mm Hemashield graft.   SURGEON:  Larina Earthly, M.D.   ASSISTANT:  Di Kindle. Edilia Bo, M.D.  Pecola Leisure, P.A.-C.   ANESTHESIA:  General endotracheal anesthesia.   COMPLICATIONS:  None.   CONDITION TO THE RECOVERY ROOM:  Stable.   PROCEDURE IN DETAIL:  The patient was taken to the operating room and placed  in supine position where the area of the abdomen and both groins were  prepped and draped in the usual sterile fashion.  An incision was made from  the level of the xiphoid to below the umbilicus and carried down through the  midline fascia with electrocautery.  The abdomen was entered.  There was one  adhesion of the omentum down into the pelvis and this was lysed.  The  Omnitrak retractor was used for exposure.  The liver, stomach, small and  large bowel were normal.  The transverse colon and omentum were reflected  superiorly.  The small bowel was reflected to the right.  The duodenum was  mobilized off the aneurysm and the aorta was encircled to below the level of  the renal arteries.  The left renal vein was mobilized to expose the renal  artery which was severely stenotic at its take off  but was of normal caliber  distal to this.  Next, the aortic bifurcation was exposed and was  extensively calcified at the aortic bifurcation.  The iliac arteries were  exposed for later occlusion.  The inferior mesenteric artery was small, was  ligated with 2-0 silk ties and divided.  Next, a separate incision was made  over the groin and the common, superficial femoral and profunda femoris  arteries were encircled with vessel loops bilaterally.  A tunnel was created  from the level of the common femoral arteries to the aortic bifurcation,  taking care to pass behind the level of the ureters bilaterally.  These  tunnels were marked with umbilical tapes.   The patient was given 25 grams of Mannitol and 7000 units of intravenous  heparin.  After adequate circulation time, the aorta was occluded  below the  level of the renal arteries and the common iliac arteries were occluded with  Henley clamps bilaterally.  The aneurysm was opened longitudinally with  electrocautery.  Lumbar back bleeding was controlled with 2-0 silk pop-off  sutures.  The distal aorta was endarterectomized due to extensive  calcification and the aorta was oversewn at the level of the aortic  bifurcation with running 3-0 Prolene sutures.  The aorta was then transected  below the level of the proximal clamp.  Again, this was extensively  calcified and was partially endarterectomized.  A 16 by 8 Hemashield graft  was brought onto the field and using felt strips for reinforcement, the  graft was sewn end-to-end to the aorta with a running 3-0 Prolene suture.  The anastomosis was completed and the graft underwent the usual flushing  maneuvers.  A Fogarty catheter was then positioned on the right and left  limb of the graft and the aortic clamp was removed.  A longer segment of  aortic body was left for eventual bypass to the right renal artery.  Next,  the limbs were brought to the prior created tunnels to the level of  their  respective groins.  The common, superficial femoral, and profunda femoris  arteries were occluded bilaterally.  The common femoral artery was opened  with an 11 blade, extended with Potts scissors bilaterally to the level of  the superficial femoral artery.  The limbs of the graft were cut to the  appropriate length and sewn end-to-side to the aorta bilaterally with  running 5-0 Prolene suture.  Prior to completion of each anastomosis, the  usual flush maneuvers were undertaken and anastomoses were completed  restoring to the limbs.   Next, the right renal artery, again, was visualized and the artery was  occluded distally with a Henley clamp and was occluded at its take off with  a serrefine clamp.  The artery was opened longitudinally and a 6 mm graft of  Hemashield was brought onto the field, was spatulated and sewn end-to-side  to the right renal artery with a running 6-0 Prolene suture.  A #3 dilator  passed easily through the distal anastomosis at the completion of the  anastomosis.  The clamps were removed from the renal artery and were placed  on the graft.  A Satinsky partial occlusion clamp was placed on the aortic  graft on the right lateral wall and the small ellipse of graft was removed.  The renal artery bypass graft was cut to the appropriate length and was  slightly spatulated and sewn end-to-side to the graft with a running 6-0  Prolene suture.  After the usual flushing maneuvers, the anastomosis was  completed and floor was restored to the kidney.   The patient was given 50 mg protamine to reverse the heparin.  The wounds  were irrigated with saline, hemostasis was obtained with electrocautery.  The wounds were closed with 2-0 Vicryl suture to close the aneurysm wall  over the graft.  Next, the retroperitoneum was closed with a running 2-0  Vicryl suture.  The small bowel was run in its entirety, found to be without injury, and was placed back in the pelvis.  The  transverse colon and omentum  were placed over this.  The midline fascia was closed with PDS suture,  beginning proximally and distally and tying in the middle.  The skin was  closed with skin clips.  The femoral wounds were closed with several layers  of 2-0  Vicryl in the subcutaneous tissue and the skin was closed with skin  clips.  A sterile dressing was applied.  The patient had palpable pedal  pulses and was transferred to the recovery room in stable condition.                                               Larina Earthly, M.D.    TFE/MEDQ  D:  04/16/2003  T:  04/16/2003  Job:  [id number]

## 2010-06-20 NOTE — H&P (Signed)
NAME:  Allison [name], Allison [name]                        ACCOUNT NO.:  [id number]   MEDICAL RECORD NO.:  [id number]                   PATIENT TYPE:  OUT   LOCATION:  NUC                                  FACILITY:  [facility]   PHYSICIAN:  Toribio Harbour, N.P.            DATE OF BIRTH:  06-Jan-1927   DATE OF ADMISSION:  04/12/2003  DATE OF DISCHARGE:                                HISTORY & PHYSICAL   DATE OF ADMISSION:  April 16, 2003.   PRIMARY CARE Meia Emley:  Soyla Murphy. Renne Crigler, M.D.   CARDIOLOGIST:  Aram Candela. Aleen Campi, M.D.   HISTORY OF PRESENT ILLNESS:  This is a 77 year old Caucasian female who  returned to the CVTS and Dr. Arbie Cookey on March 01, 2003 for continued  evaluation of her left lower extremity claudication.  She reported, at that  time that her symptoms were, now, activity limiting.  ABIs performed at the  CVTS office that day revealed greater than 1.0 on the right and 0.84 on the  left.  Dr. Arbie Cookey recommended arteriography for further evaluation.  Prior to  this procedure she was pretreated with steroids and Benadryl secondary to  her history of IVP DYE allergy.  Angiogram was performed on February 9 and  revealed in brief:  (1) Moderate left and severe right renal artery  stenosis.  (2) Small infrarenal abdominal aortic aneurysm.  (3) Extensive  bifurcation disease with subtotal occlusion of the left common femoral  artery at the bifurcation and a tight stenosis distally.  (4)  There was  significant stenosis in the right common iliac artery.   Allison [name] and her daughter and fiance returned to the office on February  24.  Since Allison [name] that her symptoms are not tolerable and she cannot  perform her ADLs without severe claudication of her left leg, Dr. Arbie Cookey  recommended surgical intervention to include aortobifemoral bypass and right  renal artery bypass.  Prior to proceeding with surgery Dr. Arbie Cookey further  recommended evaluation by Dr. Renne Crigler.  Dr. Renne Crigler had recommended  Cardiolite  stress test to evaluate her cardiac status.  This was performed today March  10, by Dr. Aleen Campi.  The test was negative.  She is now cleared for  surgery.  The procedure, risks and benefits of aortobifemoral bypass  grafting as well as right renal artery bypass were discussed with Ms.  [name] and she agrees to proceed with surgery.   PAST MEDICAL HISTORY:  1. Abdominal aortic aneurysm.  2. Hypertension.  3. Severe depression.  She is treated by Dr. Elna Breslow.  4. Interstitial cystitis followed by Dr. Logan Bores, no recent flare ups.  5. Irritable bowel syndrome, no recent flare ups.  6. Eustachian tube dysfunction with decreased hearing in her right ear.   OTHER SURGICAL HISTORY:  1. Includes appendectomy at 77 years old.  2. Hysterectomy in 1980.  3. Nephrolithotomy in 1982.  4. Shoulder surgery for calcium deposits 1994.  5. Wrist surgery in 1995.   ALLERGIES:  Allison [name] has multiple medication allergies and intolerances.  She is allergic to AMPICILLIN causes a total body rash and severe malaise.  LEVAQUIN causes erythematous total body rash.  DEMEROL causes severe  agitation and tachycardia as does DILAUDID.  IVP DYE causes a total body  rash and severe malaise.  She is allergic to [facility].  Does not recall the  reaction.  NOVOCAINE causes shortness of breath and severe dizziness.  XYLOCAINE causes localized swelling, rash, and erythema.  PAMELOR causes  tremors.  She is intolerant to the following medications:  MACRODANTIN  causes elevated blood pressure; ERYTHROMYCIN, BIAXIN, ROBAXIN, CELEBREX,  FOSAMAX, and ZOLOFT all cause nausea and severe GI discomfort; PAXIL causes  drowsiness; ELAVIL causes agitation, nausea, dysphoria as does TRIAVIL.  MEGRAM causes visual disturbances.   MEDICATIONS PRIOR TO ADMISSION:  1. Benicar 40 mg daily.  2. Pravachol 40 mg daily.  3. Aspirin 81 mg daily.  4. Lexapro 5 mg daily.  5. Zyprexa 5 mg daily.  6. Klonopin 0.5 mg  q.h.s.  7. Forteo 750 mcg subcu daily, Allison [name] states she wishes to stop this     medication while in the [name]; Dr. Renne Crigler has authorized this.   SOCIAL HISTORY:  Allison [name] is newly remarried just last week.  She has 5  grown children from her first marriage.  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  She does continue to smoke  cigarettes at a rate of 1/2 a pack a day.  She does acknowledge that she  needs to stop smoking.  We discussed the possible use of a nicotine patch in  the [name] if that becomes necessary.  She does not drink alcohol.  She  currently lives with her husband in a single level dwelling.  Mr. Illene Labrador.  __________ will be available for assistance after surgery.   FAMILY HISTORY:  Her mother died at age 18 of an MI, father died at age 25  of an MI.  She had a brother who died with an MI at age 75 and a sister with  an MI who died at age 79.  Another brother with lung cancer died at 10 years  old.   REVIEW OF SYSTEMS:  GENERAL:  In general she feels like she is just tired  and has increased leg discomfort.  HEENT:  No recent changes in her hearing  or vision.  No difficulties chewing or swallowing.  She does wear reading  glasses.  She has upper and lower dentures.  No pulmonary complaints,  specifically no shortness of breath at rest, no cough.  No cardiac  complaints, specifically no chest pain, no pedal edema.  GI:  Her weight has  been slowly increasing over the last 2 years due to inactivity.  She has had  no recent change in her bowel habits.  Her appetite is good.  She eats a  regular diet.  She does avoid seafood reporting that she had some difficulty  swallowing many years ago when she ate seafood.  GU:  No GU complaints.  No  recent dizziness, syncope, no recent falls.  She ambulates independently,  does not use any assistive devices.  She does report claudication in both thighs and buttock.  No recent skin  rashes, no skin breakdown.  She has had  an upper respiratory infection sinus problem approximately 2 weeks ago.  She  is symptom-free now.  No  history of blood clots.  She does have a history of  depression and tells me that she had severe withdrawal symptoms when her  psychiatric medications were stopped with her surgery in 1994.  This  occurred after 3 days without her medications.   PHYSICAL EXAMINATION:  VITAL SIGNS:  Blood pressure on the right 138/66, on  the left 128/64.  Her heart rate is 60. Her height is 5  feet 1-1/2 inches  tall.  Weight is approximately 139 pounds.  GENERAL:  In general this is a 77 year old Caucasian female in no acute  distress.  She is pleasant and cooperative with this exam.  HEENT:  EYES:  PERRLA.  EOMI.  Oral mucosa is pink and moist.  She does have  limited range of motion in her jaw.  Her mouth does not open very wide.  NECK:  Her neck is with a full range of motion.  No carotid bruits,  thyromegaly or lymphadenopathy.  CHEST:  Her breathing is unlabored.  Her breath sounds are clear  bilaterally.  HEART:  Her heart is in a regular rate and rhythm.  There are no murmurs.  ABDOMEN:  Her abdomen is rotund with bowel sounds.  A soft bruit is audible.  Her abdomen is soft and nontender.  EXTREMITIES:  Lower extremities are without edema, varicosities or venous  stasis changes.  She has 1+ femoral pulse on the right, a 2+ dorsalis pedis  and posterior pulses on the right.  On the left the pulses are not palpable.  NEUROLOGIC:  She is alert and oriented.  Cranial nerves II-XII are grossly  intact.  Her gait is steady but slow.   LABORATORY STUDIES:  Pending and will include a CBC, BMET, PT/INR, and  urinalysis.  Carotid duplex exam performed today at the CVTS office revealed  mild bilateral carotid artery stenosis in the 1-39% range bilaterally.   IMPRESSION:  The is a 77 year old female with activity limiting claudication  secondary to aortoiliac  occlusive disease, also with a small-to-moderate  size abdominal aortic aneurysm and severe (95%) right renal artery stenosis.   PLAN:  1. For elective admission to [facility] into the care of Dr. Arbie Cookey     on April 16, 2003 for aortobifemoral bypass and right renal artery bypass     grafting.  2. Her home medications of Lexapro, Zyprexa, and Klonopin will be restarted     as soon as possible after surgery to avoid withdrawal symptoms.                                                Toribio Harbour, N.P.    CTK/MEDQ  D:  04/12/2003  T:  04/13/2003  Job:  [id number]   cc:   Larina Earthly, M.D.  [address]  [place]  [place] [address]

## 2010-10-27 LAB — URINALYSIS, ROUTINE W REFLEX MICROSCOPIC
Ketones, ur: NEGATIVE
Nitrite: NEGATIVE
Protein, ur: NEGATIVE
Urobilinogen, UA: 0.2

## 2010-10-27 LAB — COMPREHENSIVE METABOLIC PANEL
ALT: 10
AST: 24
Alkaline Phosphatase: 82
CO2: 30
Calcium: 10.1
Chloride: 104
GFR calc non Af Amer: >60
Glucose, Bld: 79
Sodium: 141
Total Bilirubin: 0.5

## 2010-10-27 LAB — DIFFERENTIAL
Basophils Absolute: 0.1
Basophils Relative: 1
Eosinophils Absolute: 0.2
Eosinophils Relative: 2
Lymphs Abs: 2.4
Neutrophils Relative %: 59

## 2010-10-27 LAB — CBC
Hemoglobin: 13.3
MCHC: 34
RBC: 4.44
WBC: 8.1

## 2011-02-06 DIAGNOSIS — L821 Other seborrheic keratosis: Secondary | ICD-10-CM | POA: Diagnosis not present

## 2011-02-06 DIAGNOSIS — D239 Other benign neoplasm of skin, unspecified: Secondary | ICD-10-CM | POA: Diagnosis not present

## 2011-04-03 DIAGNOSIS — I10 Essential (primary) hypertension: Secondary | ICD-10-CM | POA: Diagnosis not present

## 2011-04-03 DIAGNOSIS — M810 Age-related osteoporosis without current pathological fracture: Secondary | ICD-10-CM | POA: Diagnosis not present

## 2011-04-03 DIAGNOSIS — E785 Hyperlipidemia, unspecified: Secondary | ICD-10-CM | POA: Diagnosis not present

## 2011-04-03 DIAGNOSIS — Z79899 Other long term (current) drug therapy: Secondary | ICD-10-CM | POA: Diagnosis not present

## 2011-04-10 DIAGNOSIS — L570 Actinic keratosis: Secondary | ICD-10-CM | POA: Diagnosis not present

## 2011-04-10 DIAGNOSIS — I10 Essential (primary) hypertension: Secondary | ICD-10-CM | POA: Diagnosis not present

## 2011-04-10 DIAGNOSIS — E7800 Pure hypercholesterolemia, unspecified: Secondary | ICD-10-CM | POA: Diagnosis not present

## 2011-06-15 DIAGNOSIS — H2510 Age-related nuclear cataract, unspecified eye: Secondary | ICD-10-CM | POA: Diagnosis not present

## 2011-08-31 DIAGNOSIS — F329 Major depressive disorder, single episode, unspecified: Secondary | ICD-10-CM | POA: Diagnosis not present

## 2011-08-31 DIAGNOSIS — M4120 Other idiopathic scoliosis, site unspecified: Secondary | ICD-10-CM | POA: Diagnosis not present

## 2011-08-31 DIAGNOSIS — M533 Sacrococcygeal disorders, not elsewhere classified: Secondary | ICD-10-CM | POA: Diagnosis not present

## 2011-08-31 DIAGNOSIS — M949 Disorder of cartilage, unspecified: Secondary | ICD-10-CM | POA: Diagnosis not present

## 2011-08-31 DIAGNOSIS — M47817 Spondylosis without myelopathy or radiculopathy, lumbosacral region: Secondary | ICD-10-CM | POA: Diagnosis not present

## 2011-08-31 DIAGNOSIS — R079 Chest pain, unspecified: Secondary | ICD-10-CM | POA: Diagnosis not present

## 2011-08-31 DIAGNOSIS — M899 Disorder of bone, unspecified: Secondary | ICD-10-CM | POA: Diagnosis not present

## 2011-09-04 DIAGNOSIS — F3130 Bipolar disorder, current episode depressed, mild or moderate severity, unspecified: Secondary | ICD-10-CM | POA: Diagnosis not present

## 2011-09-07 DIAGNOSIS — F339 Major depressive disorder, recurrent, unspecified: Secondary | ICD-10-CM | POA: Diagnosis not present

## 2011-09-07 DIAGNOSIS — R1013 Epigastric pain: Secondary | ICD-10-CM | POA: Diagnosis not present

## 2011-09-09 DIAGNOSIS — R1013 Epigastric pain: Secondary | ICD-10-CM | POA: Diagnosis not present

## 2011-09-14 DIAGNOSIS — F339 Major depressive disorder, recurrent, unspecified: Secondary | ICD-10-CM | POA: Diagnosis not present

## 2011-09-14 DIAGNOSIS — R1084 Generalized abdominal pain: Secondary | ICD-10-CM | POA: Diagnosis not present

## 2011-09-22 DIAGNOSIS — H43399 Other vitreous opacities, unspecified eye: Secondary | ICD-10-CM | POA: Diagnosis not present

## 2011-09-23 DIAGNOSIS — F339 Major depressive disorder, recurrent, unspecified: Secondary | ICD-10-CM | POA: Diagnosis not present

## 2011-09-23 DIAGNOSIS — R05 Cough: Secondary | ICD-10-CM | POA: Diagnosis not present

## 2011-10-06 ENCOUNTER — Encounter (HOSPITAL_COMMUNITY): Payer: Self-pay | Admitting: Adult Health

## 2011-10-06 DIAGNOSIS — F329 Major depressive disorder, single episode, unspecified: Secondary | ICD-10-CM | POA: Diagnosis present

## 2011-10-06 DIAGNOSIS — R51 Headache: Secondary | ICD-10-CM | POA: Diagnosis not present

## 2011-10-06 DIAGNOSIS — E785 Hyperlipidemia, unspecified: Secondary | ICD-10-CM | POA: Diagnosis present

## 2011-10-06 DIAGNOSIS — H547 Unspecified visual loss: Secondary | ICD-10-CM | POA: Diagnosis not present

## 2011-10-06 DIAGNOSIS — H349 Unspecified retinal vascular occlusion: Secondary | ICD-10-CM | POA: Diagnosis present

## 2011-10-06 DIAGNOSIS — H5440 Blindness, one eye, unspecified eye: Secondary | ICD-10-CM | POA: Diagnosis not present

## 2011-10-06 DIAGNOSIS — H3410 Central retinal artery occlusion, unspecified eye: Secondary | ICD-10-CM | POA: Diagnosis not present

## 2011-10-06 DIAGNOSIS — I6340 Cerebral infarction due to embolism of unspecified cerebral artery: Principal | ICD-10-CM | POA: Diagnosis present

## 2011-10-06 DIAGNOSIS — H538 Other visual disturbances: Secondary | ICD-10-CM | POA: Diagnosis not present

## 2011-10-06 DIAGNOSIS — H579 Unspecified disorder of eye and adnexa: Secondary | ICD-10-CM | POA: Diagnosis not present

## 2011-10-06 DIAGNOSIS — F3289 Other specified depressive episodes: Secondary | ICD-10-CM | POA: Diagnosis present

## 2011-10-06 DIAGNOSIS — I10 Essential (primary) hypertension: Secondary | ICD-10-CM | POA: Diagnosis not present

## 2011-10-06 DIAGNOSIS — H539 Unspecified visual disturbance: Secondary | ICD-10-CM | POA: Diagnosis not present

## 2011-10-06 DIAGNOSIS — R0989 Other specified symptoms and signs involving the circulatory and respiratory systems: Secondary | ICD-10-CM | POA: Diagnosis not present

## 2011-10-06 DIAGNOSIS — H2510 Age-related nuclear cataract, unspecified eye: Secondary | ICD-10-CM | POA: Diagnosis not present

## 2011-10-06 DIAGNOSIS — H35379 Puckering of macula, unspecified eye: Secondary | ICD-10-CM | POA: Diagnosis not present

## 2011-10-06 DIAGNOSIS — I6789 Other cerebrovascular disease: Secondary | ICD-10-CM | POA: Diagnosis not present

## 2011-10-06 LAB — CBC WITH DIFFERENTIAL/PLATELET
Basophils Absolute: 0 10*3/uL (ref 0.0–0.1)
Basophils Relative: 0 % (ref 0–1)
Eosinophils Relative: 2 % (ref 0–5)
HCT: 40.1 % (ref 36.0–46.0)
Lymphocytes Relative: 19 % (ref 12–46)
MCHC: 33.7 g/dL (ref 30.0–36.0)
Monocytes Absolute: 1.1 10*3/uL — ABNORMAL HIGH (ref 0.1–1.0)
Neutro Abs: 7.1 10*3/uL (ref 1.7–7.7)
Platelets: 263 10*3/uL (ref 150–400)
RDW: 13.3 % (ref 11.5–15.5)
WBC: 10.5 10*3/uL (ref 4.0–10.5)

## 2011-10-06 NOTE — ED Notes (Addendum)
C/o right eye vision loss that began last night at 6 pm. Pt reports only being able to see light, no images. Pt has a "right sided carotid blockage" c/o nausea. Left eye reactive to light, brisk +4. Right eye dilated, non reactive. frequent yawning, alert and oriented, nswering questions appropriately.  PT has seen 2 eye doctors today and both have sent her here to be seen for a carotid ultrasound.

## 2011-10-07 ENCOUNTER — Inpatient Hospital Stay (HOSPITAL_COMMUNITY): Payer: Medicare Other

## 2011-10-07 ENCOUNTER — Encounter (HOSPITAL_COMMUNITY): Payer: Self-pay | Admitting: Radiology

## 2011-10-07 ENCOUNTER — Inpatient Hospital Stay (HOSPITAL_COMMUNITY)
Admission: EM | Admit: 2011-10-07 | Discharge: 2011-10-09 | DRG: 065 | Disposition: A | Discharge status: Home / Self Care | Payer: Medicare Other | Attending: Internal Medicine | Admitting: Internal Medicine

## 2011-10-07 ENCOUNTER — Emergency Department (HOSPITAL_COMMUNITY): Payer: Medicare Other

## 2011-10-07 DIAGNOSIS — I6340 Cerebral infarction due to embolism of unspecified cerebral artery: Secondary | ICD-10-CM | POA: Diagnosis not present

## 2011-10-07 DIAGNOSIS — H5440 Blindness, one eye, unspecified eye: Secondary | ICD-10-CM | POA: Diagnosis present

## 2011-10-07 DIAGNOSIS — I6350 Cerebral infarction due to unspecified occlusion or stenosis of unspecified cerebral artery: Secondary | ICD-10-CM | POA: Diagnosis not present

## 2011-10-07 DIAGNOSIS — F32A Depression, unspecified: Secondary | ICD-10-CM | POA: Diagnosis present

## 2011-10-07 DIAGNOSIS — I6789 Other cerebrovascular disease: Secondary | ICD-10-CM | POA: Diagnosis not present

## 2011-10-07 DIAGNOSIS — I369 Nonrheumatic tricuspid valve disorder, unspecified: Secondary | ICD-10-CM | POA: Diagnosis not present

## 2011-10-07 DIAGNOSIS — I639 Cerebral infarction, unspecified: Secondary | ICD-10-CM | POA: Diagnosis present

## 2011-10-07 DIAGNOSIS — H34231 Retinal artery branch occlusion, right eye: Secondary | ICD-10-CM | POA: Diagnosis present

## 2011-10-07 DIAGNOSIS — H34239 Retinal artery branch occlusion, unspecified eye: Secondary | ICD-10-CM | POA: Diagnosis not present

## 2011-10-07 DIAGNOSIS — K648 Other hemorrhoids: Secondary | ICD-10-CM | POA: Insufficient documentation

## 2011-10-07 DIAGNOSIS — K8020 Calculus of gallbladder without cholecystitis without obstruction: Secondary | ICD-10-CM | POA: Insufficient documentation

## 2011-10-07 DIAGNOSIS — F329 Major depressive disorder, single episode, unspecified: Secondary | ICD-10-CM | POA: Diagnosis present

## 2011-10-07 DIAGNOSIS — R300 Dysuria: Secondary | ICD-10-CM | POA: Diagnosis not present

## 2011-10-07 DIAGNOSIS — E785 Hyperlipidemia, unspecified: Secondary | ICD-10-CM | POA: Diagnosis not present

## 2011-10-07 DIAGNOSIS — H349 Unspecified retinal vascular occlusion: Secondary | ICD-10-CM | POA: Diagnosis present

## 2011-10-07 DIAGNOSIS — R51 Headache: Secondary | ICD-10-CM | POA: Diagnosis not present

## 2011-10-07 DIAGNOSIS — K279 Peptic ulcer, site unspecified, unspecified as acute or chronic, without hemorrhage or perforation: Secondary | ICD-10-CM | POA: Diagnosis present

## 2011-10-07 DIAGNOSIS — I6509 Occlusion and stenosis of unspecified vertebral artery: Secondary | ICD-10-CM | POA: Diagnosis not present

## 2011-10-07 DIAGNOSIS — F3289 Other specified depressive episodes: Secondary | ICD-10-CM | POA: Diagnosis present

## 2011-10-07 DIAGNOSIS — F17200 Nicotine dependence, unspecified, uncomplicated: Secondary | ICD-10-CM | POA: Diagnosis not present

## 2011-10-07 DIAGNOSIS — Z720 Tobacco use: Secondary | ICD-10-CM | POA: Diagnosis present

## 2011-10-07 DIAGNOSIS — I10 Essential (primary) hypertension: Secondary | ICD-10-CM | POA: Diagnosis present

## 2011-10-07 DIAGNOSIS — I6529 Occlusion and stenosis of unspecified carotid artery: Secondary | ICD-10-CM | POA: Diagnosis not present

## 2011-10-07 DIAGNOSIS — H547 Unspecified visual loss: Secondary | ICD-10-CM | POA: Diagnosis not present

## 2011-10-07 DIAGNOSIS — I999 Unspecified disorder of circulatory system: Secondary | ICD-10-CM | POA: Diagnosis present

## 2011-10-07 HISTORY — DX: Essential (primary) hypertension: I10

## 2011-10-07 HISTORY — DX: Depression, unspecified: F32.A

## 2011-10-07 HISTORY — DX: Other specified disorders of Eustachian tube, unspecified ear: H69.80

## 2011-10-07 HISTORY — DX: Unspecified eustachian tube disorder, unspecified ear: H69.90

## 2011-10-07 HISTORY — DX: Transient cerebral ischemic attack, unspecified: G45.9

## 2011-10-07 HISTORY — DX: Cardiac murmur, unspecified: R01.1

## 2011-10-07 HISTORY — DX: Unspecified appendicitis: K37

## 2011-10-07 HISTORY — DX: Calculus of gallbladder without cholecystitis without obstruction: K80.20

## 2011-10-07 HISTORY — DX: Major depressive disorder, single episode, unspecified: F32.9

## 2011-10-07 HISTORY — DX: Aortic aneurysm of unspecified site, without rupture: I71.9

## 2011-10-07 HISTORY — DX: Other hemorrhoids: K64.8

## 2011-10-07 HISTORY — DX: Irritable bowel syndrome, unspecified: K58.9

## 2011-10-07 LAB — CBC
HCT: 38.8 % (ref 36.0–46.0)
Hemoglobin: 13 g/dL (ref 12.0–15.0)
MCHC: 33.5 g/dL (ref 30.0–36.0)
MCV: 89.8 fL (ref 78.0–100.0)
RDW: 13.3 % (ref 11.5–15.5)

## 2011-10-07 LAB — PROTIME-INR: Prothrombin Time: 13.4 seconds (ref 11.6–15.2)

## 2011-10-07 LAB — BASIC METABOLIC PANEL
CO2: 28 mEq/L (ref 19–32)
Calcium: 9.9 mg/dL (ref 8.4–10.5)
Chloride: 107 mEq/L (ref 96–112)
Creatinine, Ser: 0.77 mg/dL (ref 0.50–1.10)
GFR calc Af Amer: 88 mL/min — ABNORMAL LOW (ref >90)
Sodium: 145 mEq/L (ref 135–145)

## 2011-10-07 LAB — APTT: aPTT: 30 seconds (ref 24–37)

## 2011-10-07 MED ORDER — CYCLOSPORINE 0.05 % OP EMUL
1.0000 [drp] | Freq: Two times a day (BID) | OPHTHALMIC | Status: DC
  Administered 2011-10-07 – 2011-10-09 (×4): 1 [drp] via OPHTHALMIC
  Filled 2011-10-07 (×6): qty 1

## 2011-10-07 MED ORDER — PANTOPRAZOLE SODIUM 40 MG PO TBEC
40.0000 mg | DELAYED_RELEASE_TABLET | Freq: Every day | ORAL | Status: DC
  Administered 2011-10-07 – 2011-10-09 (×3): 40 mg via ORAL
  Filled 2011-10-07 (×2): qty 1

## 2011-10-07 MED ORDER — WHITE PETROLATUM GEL
Status: AC
  Administered 2011-10-07: 09:00:00
  Filled 2011-10-07: qty 5

## 2011-10-07 MED ORDER — ATORVASTATIN CALCIUM 20 MG PO TABS
20.0000 mg | ORAL_TABLET | Freq: Every day | ORAL | Status: DC
  Administered 2011-10-07 – 2011-10-08 (×2): 20 mg via ORAL
  Filled 2011-10-07 (×3): qty 1

## 2011-10-07 MED ORDER — OLANZAPINE 2.5 MG PO TABS
2.5000 mg | ORAL_TABLET | Freq: Every day | ORAL | Status: DC
  Administered 2011-10-07 – 2011-10-08 (×2): 2.5 mg via ORAL
  Filled 2011-10-07 (×3): qty 1

## 2011-10-07 MED ORDER — ONDANSETRON HCL 4 MG/2ML IJ SOLN: 4.0000 mg | Freq: Four times a day (QID) | INTRAMUSCULAR | Status: DC | PRN

## 2011-10-07 MED ORDER — ESCITALOPRAM OXALATE 10 MG PO TABS
10.0000 mg | ORAL_TABLET | Freq: Every day | ORAL | Status: DC
  Administered 2011-10-07 – 2011-10-09 (×3): 10 mg via ORAL
  Filled 2011-10-07 (×3): qty 1

## 2011-10-07 MED ORDER — CLOPIDOGREL BISULFATE 75 MG PO TABS
75.0000 mg | ORAL_TABLET | Freq: Every day | ORAL | Status: DC
  Administered 2011-10-08 – 2011-10-09 (×2): 75 mg via ORAL
  Filled 2011-10-07 (×3): qty 1

## 2011-10-07 MED ORDER — SENNOSIDES-DOCUSATE SODIUM 8.6-50 MG PO TABS: 1.0000 | ORAL_TABLET | Freq: Every evening | ORAL | Status: DC | PRN

## 2011-10-07 MED ORDER — ENOXAPARIN SODIUM 40 MG/0.4ML ~~LOC~~ SOLN
40.0000 mg | SUBCUTANEOUS | Status: DC
  Administered 2011-10-07 – 2011-10-08 (×2): 40 mg via SUBCUTANEOUS
  Filled 2011-10-07 (×3): qty 0.4

## 2011-10-07 MED ORDER — ASPIRIN 325 MG PO TABS
325.0000 mg | ORAL_TABLET | Freq: Every day | ORAL | Status: DC
  Administered 2011-10-07: 325 mg via ORAL

## 2011-10-07 MED ORDER — AMLODIPINE BESYLATE 5 MG PO TABS
5.0000 mg | ORAL_TABLET | Freq: Every day | ORAL | Status: DC
  Administered 2011-10-07 – 2011-10-09 (×3): 5 mg via ORAL
  Filled 2011-10-07 (×3): qty 1

## 2011-10-07 NOTE — Progress Notes (Signed)
VASCULAR LAB PRELIMINARY  PRELIMINARY  PRELIMINARY  PRELIMINARY  Carotid duplex completed.    Preliminary report:  Bilateral:  No evidence of hemodynamically significant internal carotid artery stenosis.   Vertebral artery flow is antegrade. Left vertebral artery flow is diminished with absence diastolic component.     Luana Tatro, RVS 10/07/2011, 5:24 PM

## 2011-10-07 NOTE — ED Notes (Signed)
Pt continues to deny pain. Step-daughter Dewitt Rota [phone] stated to call her if need be.  Pt's son, Arnette Norris, who works in the medical field, is planning on coming to hospital tomorrow [phone].

## 2011-10-07 NOTE — Consult Note (Signed)
[facility] CONSULT NOTE     Reason for Consult: central retinal artery occlusion    HPI:    Allison [name] is an 78 y.o. female history of vascular problems including AAA S/P repair in 2005, hypertension and a past history of TIA, who noted on Monday around 6 pm that she had right eye visual changes.  Initially she had bright white dots/lines in her right eye visual field which turned into decreased vision. She is unsure exactly how long this took.   Patient called her primary MD who referred her to a ophthalmologist out patient on tuesday, who referred her to a retina specialist who visualized the central retinal artery occlusion.  Patient was brought to the ED 10/07/11 due to feeling nauseated and unwell last night.  Her PCP wanted her to be admitted to hospital for further evaluation of the Carotid arteries/intracranial vasculature and brain.   Past Medical History  Diagnosis Date  . HTN (hypertension)   . Aortic aneurysm   . Transient ischemic attack   . Murmur, cardiac   . Cholelithiasis   . Internal hemorrhoids   . Irritable bowel syndrome   . Depression     Past Surgical History  Procedure Date  . Abdominal aortic aneurysm repair 2005    Family History  Problem Relation Age of Onset  . Asthma Brother   . Diabetes Mother   . Coronary artery disease Mother     died age 60  . Coronary artery disease Father     died age 17  . Coronary artery disease Brother   . Coronary artery disease Sister     Social History:  reports that she has been smoking Cigarettes.  She has a 20 pack-year smoking history. She does not have any smokeless tobacco history on file. She reports that she does not drink alcohol or use illicit drugs.  Allergies  Allergen Reactions  . Shellfish Allergy Shortness Of Breath and Rash    All seafood  . Cozaar (Losartan)     High blood pressure  . Demerol (Meperidine)     unknown  . Hydromorphone     Hives   . Ivp  Dye (Iodinated Diagnostic Agents)     rash  . Penicillins     rash    Medications:    Prior to Admission:  Prescriptions prior to admission  Medication Sig Dispense Refill  . amLODipine (NORVASC) 5 MG tablet Take 5 mg by mouth daily.      . calcium-vitamin D (OSCAL WITH D) 500-200 MG-UNIT per tablet Take 1 tablet by mouth daily.      Marland Kitchen co-enzyme Q-10 30 MG capsule Take 30 mg by mouth daily.      . cycloSPORINE (RESTASIS) 0.05 % ophthalmic emulsion Place 1 drop into both eyes 2 (two) times daily.      Marland Kitchen escitalopram (LEXAPRO) 20 MG tablet Take 20 mg by mouth daily.      Marland Kitchen OLANZapine (ZYPREXA) 2.5 MG tablet Take 2.5 mg by mouth daily.      Marland Kitchen olmesartan (BENICAR) 40 MG tablet Take 40 mg by mouth daily.      . pantoprazole (PROTONIX) 40 MG tablet Take 40 mg by mouth daily.      Bertram Gala Glycol-Propyl Glycol (SYSTANE FREE OP) Place 1 drop into both eyes daily as needed. For dry eyes      .  rosuvastatin (CRESTOR) 10 MG tablet Take 10 mg by mouth daily.       Scheduled:   . amLODipine  5 mg Oral Daily  . aspirin  325 mg Oral Daily  . atorvastatin  20 mg Oral q1800  . cycloSPORINE  1 drop Both Eyes BID  . enoxaparin  40 mg Subcutaneous Q24H  . escitalopram  10 mg Oral Daily  . OLANZapine  2.5 mg Oral Daily  . pantoprazole  40 mg Oral Q1200  . white petrolatum        Review of Systems - General ROS: negative for - chills, fatigue, fever or hot flashes Hematological and Lymphatic ROS: negative for - bruising, fatigue, jaundice or pallor Endocrine ROS: negative for - hair pattern changes, hot flashes, mood swings or skin changes Respiratory ROS: negative for - cough, hemoptysis, orthopnea or wheezing Cardiovascular ROS: negative for - dyspnea on exertion, orthopnea, palpitations or shortness of breath Gastrointestinal ROS: negative for - abdominal pain, appetite loss, blood in stools, diarrhea or hematemesis Musculoskeletal ROS: negative for - joint pain, joint stiffness, joint  swelling or muscle pain Neurological ROS: positive for - visual changes, HA, eye pain Dermatological ROS: negative for dry skin, pruritus and rash   Blood pressure 124/45, pulse 63, temperature 98.1 F (36.7 C), temperature source Oral, resp. rate 18, height 5\' 1"  (1.549 m), weight 66.7 kg (147 lb 0.8 oz), SpO2 98.00%.   Neurologic Examination:  Mental Status: Alert, oriented X 3.  Speech fluent without evidence of aphasia. Able to follow 3 step commands without difficulty. Mood: worried Memory: intact Thought content appropriate Cranial Nerves: II-Right eye is able to see hand motion, unable to finger count.  Her left eye has full visual field intact.  Fundoscopy exam showed relatively symmetric fundus color.  III/IV/VI-Extraocular movements intact.  Right pupil shows APD.  Pupils are equal at rest.  She does appear to have a ptosis on the right.  V/VII-Smile symmetric VIII-grossly intact IX/X-normal gag XI-bilateral shoulder shrug XII-midline tongue extension Motor: 5/5 bilaterally with normal tone and bulk Sensory: Pinprick and light touch intact throughout, bilaterally Deep Tendon Reflexes:  Right: Upper Extremity   Left: Upper extremity   biceps (C-5 to C-6) 2/4   biceps (C-5 to C-6) 2/4 tricep (C7) 2/4    triceps (C7) 2/4 Brachioradialis (C6) 2/4  Brachioradialis (C6) 2/4  Lower Extremity Lower Extremity  quadriceps (L-2 to L-4) 2/4   quadriceps (L-2 to L-4) 2/4 Achilles (S1) 1/4   Achilles (S1) 1/4      Plantars:      Right:  downgoing     Left:  Downgoing Cerebellar: Normal finger-to-nose, normal heel-to-shin test.    Results for orders placed during the hospital encounter of 10/07/11 (from the past 48 hour(s))  CBC WITH DIFFERENTIAL     Status: Abnormal   Collection Time   10/06/11 11:20 PM      Component Value Range Comment   WBC 10.5  4.0 - 10.5 K/uL    RBC 4.46  3.87 - 5.11 MIL/uL    Hemoglobin 13.5  12.0 - 15.0 g/dL    HCT 16.1  09.6 - 04.5 %    MCV 89.9   78.0 - 100.0 fL    MCH 30.3  26.0 - 34.0 pg    MCHC 33.7  30.0 - 36.0 g/dL    RDW 40.9  81.1 - 91.4 %    Platelets 263  150 - 400 K/uL    Neutrophils Relative 68  43 -  77 %    Neutro Abs 7.1  1.7 - 7.7 K/uL    Lymphocytes Relative 19  12 - 46 %    Lymphs Abs 2.0  0.7 - 4.0 K/uL    Monocytes Relative 10  3 - 12 %    Monocytes Absolute 1.1 (*) 0.1 - 1.0 K/uL    Eosinophils Relative 2  0 - 5 %    Eosinophils Absolute 0.3  0.0 - 0.7 K/uL    Basophils Relative 0  0 - 1 %    Basophils Absolute 0.0  0.0 - 0.1 K/uL   BASIC METABOLIC PANEL     Status: Abnormal   Collection Time   10/06/11 11:20 PM      Component Value Range Comment   Sodium 145  135 - 145 mEq/L    Potassium 3.6  3.5 - 5.1 mEq/L    Chloride 107  96 - 112 mEq/L    CO2 28  19 - 32 mEq/L    Glucose, Bld 110 (*) 70 - 99 mg/dL    BUN 12  6 - 23 mg/dL    Creatinine, Ser 4.09  0.50 - 1.10 mg/dL    Calcium 9.9  8.4 - 81.1 mg/dL    GFR calc non Af Amer 76 (*) >90 mL/min    GFR calc Af Amer 88 (*) >90 mL/min   APTT     Status: Normal   Collection Time   10/07/11  1:25 AM      Component Value Range Comment   aPTT 30  24 - 37 seconds   PROTIME-INR     Status: Normal   Collection Time   10/07/11  1:25 AM      Component Value Range Comment   Prothrombin Time 13.4  11.6 - 15.2 seconds    INR 1.00  0.00 - 1.49   CBC     Status: Normal   Collection Time   10/07/11  6:35 AM      Component Value Range Comment   WBC 7.0  4.0 - 10.5 K/uL    RBC 4.32  3.87 - 5.11 MIL/uL    Hemoglobin 13.0  12.0 - 15.0 g/dL    HCT 91.4  78.2 - 95.6 %    MCV 89.8  78.0 - 100.0 fL    MCH 30.1  26.0 - 34.0 pg    MCHC 33.5  30.0 - 36.0 g/dL    RDW 21.3  08.6 - 57.8 %    Platelets 248  150 - 400 K/uL   CREATININE, SERUM     Status: Abnormal   Collection Time   10/07/11  6:35 AM      Component Value Range Comment   Creatinine, Ser 0.78  0.50 - 1.10 mg/dL    GFR calc non Af Amer 76 (*) >90 mL/min    GFR calc Af Amer 88 (*) >90 mL/min     Ct Head Wo  Contrast  10/07/2011  *RADIOLOGY REPORT*  Clinical Data: 78 year old with right eye vision loss and headache.  CT HEAD WITHOUT CONTRAST  Technique:  Contiguous axial images were obtained from the base of the skull through the vertex without contrast.  Comparison: None.  Findings: There is no evidence of acute intracranial hemorrhage, mass effect, brain edema or extra-axial fluid collection.  The ventricles and subarachnoid spaces are diffusely prominent consistent with mild atrophy. Diffuse periventricular low density is most compatible with moderate chronic small vessel ischemic change.   No acute cortical infarction  is seen.  The visualized paranasal sinuses are clear. The calvarium is intact. The orbits are incompletely imaged.  The right lens is not visualized, but the globe is incompletely seen.  There is no evidence of globe injury.  IMPRESSION:  1.  No acute intracranial findings. 2.  Atrophy and chronic small vessel ischemic changes. 3.  Clinical correlation regarding the right eye visual loss recommended.  The globes are incompletely visualized.   Original Report Authenticated By: Gerrianne Scale, M.D.     Study Conclusions 2 D echo  - Left ventricle: Wall thickness was increased in a pattern of moderate LVH. Systolic function was normal. The estimated ejection fraction was in the range of 55% to 60%. - Tricuspid valve: Moderate regurgitation. - Pulmonary arteries: PA peak pressure: 36mm Hg (S). Transthoracic echocardiography. M-mode, complete 2D, spectral Doppler, and color Doppler. Height: Height: 154.9cm. Height: 61in. Weight: Weight: 66.7kg. Weight: 146.7lb. Body mass index: BMI: 27.8kg/m^2. Body surface area: BSA: 1.52m^2. Blood pressure: 116/41. Patient status: Inpatient. Location: Bedside.    Assessment/Plan:   78 YO female with past medical history of HTN, hyperlipidemia, AA and TIA. Patient presented with change in vision on Monday which developed into visual loss in right  eye.  She has seen both Opthalmology ( Dr. Nile Riggs) and Retinal specialist (Dr. Allyne Gee).  We have spoken with Dr. Allyne Gee who confirms he visualized the central retinal artery occlusion. Her ptosis on the right does concern me for a more distal etiology of stroke and an MRA head/ MRI orbits to look for a distal source of embolic/cause of horner's syndrome is merited.   Recommend: Stroke work up including-- 1)  A1c, fasting lipid panel 2) Carotid Doppler to evaluate for stenosis 3) MRI/MRA head and orbits 4) PT/OT 5) change ASA to Plavix daily.   Stroke team will follow in AM.    Felicie Morn PA-C [facility] [phone]  10/07/2011, 3:30 PM   I have reviewed this note and examined this patient and made appropriate changes to this note.   Ritta Slot, MD [facility] [phone]

## 2011-10-07 NOTE — Progress Notes (Signed)
Occupational Therapy Evaluation Patient Details Name: Allison [name] MRN: [id number] DOB: 1926-07-28 Today's Date: 10/07/2011 Time: 7829-5621 OT Time Calculation (min): 16 min  OT Assessment / Plan / Recommendation Clinical Impression  Pt admitted with acute right eye blindness and nausea.  Pt found to have central retinal artery occlusion.  Will benefit from acute OT services to ensure safe d/c home. Recommending OPOT.    OT Assessment  Patient needs continued OT Services    Follow Up Recommendations  Outpatient OT;Supervision/Assistance - 24 hour    Barriers to Discharge      Equipment Recommendations  None recommended by OT    Recommendations for Other Services    Frequency  Min 2X/week    Precautions / Restrictions Precautions Precautions: Fall Precaution Comments: decreased vision in right eye Restrictions Weight Bearing Restrictions: No   Pertinent Vitals/Pain See vitals    ADL  Grooming: Performed;Wash/dry hands;Supervision/safety Where Assessed - Grooming: Unsupported standing Toilet Transfer: Performed;Min guard Statistician Method: Sit to Barista: Comfort height toilet Toileting - Clothing Manipulation and Hygiene: Performed;Supervision/safety Where Assessed - Engineer, mining and Hygiene: Sit to stand from 3-in-1 or toilet Equipment Used: Gait belt Transfers/Ambulation Related to ADLs: min guard for safety ADL Comments: Supervision-min guard during standing tasks for safety due to decreased vision in R eye.  Pt reports feeling a little dizzy.  Reommended to pt to avoid driving until cleared by MD and to have 24/7 supervision for safety.  Discussed with pt use of compensatory strategies (scannig enviroment) to assist in decreasing fall risk.    OT Diagnosis: Disturbance of vision  OT Problem List: Impaired balance (sitting and/or standing);Impaired vision/perception OT Treatment Interventions: Self-care/ADL  training;Therapeutic activities;Visual/perceptual remediation/compensation;Patient/family education;Balance training   OT Goals Acute Rehab OT Goals OT Goal Formulation: With patient Time For Goal Achievement: 10/14/11 Potential to Achieve Goals: Good ADL Goals Pt Will Transfer to Toilet: with supervision;Ambulation;Comfort height toilet ADL Goal: Toilet Transfer - Progress: Goal set today Pt Will Perform Tub/Shower Transfer: Tub transfer;with supervision;Ambulation ADL Goal: Tub/Shower Transfer - Progress: Goal set today Miscellaneous OT Goals Miscellaneous OT Goal #1: Pt will be able to retrieve ADL items with supervision and no LOB. OT Goal: Miscellaneous Goal #1 - Progress: Goal set today Miscellaneous OT Goal #2: Pt will independently use compensatory vision techniques to safely perform functional mobility. OT Goal: Miscellaneous Goal #2 - Progress: Goal set today  Visit Information  Last OT Received On: 10/07/11 Assistance Needed: +1    Subjective Data      Prior Functioning  Vision/Perception  Home Living Lives With: Spouse Available Help at Discharge: Family;Available 24 hours/day Type of Home: House Home Access: Stairs to enter [employer] of Steps: 2 Entrance Stairs-Rails: Right Home Layout: One level Bathroom Shower/Tub: Engineer, manufacturing systems: Standard Home Adaptive Equipment: Bedside commode/3-in-1;Straight cane;Walker - rolling Prior Function Level of Independence: Independent Able to Take Stairs?: Yes Driving: Yes Vocation: Retired Musician: No difficulties Dominant Hand: Right   Vision - Assessment Vision Assessment: Vision tested Tracking/Visual Pursuits: Impaired - to be further tested in functional context Visual Fields: Impaired - to be further tested in functional context Additional Comments: Pt reports that she can discern light/dark in right eye.  Pt able to detect therapist's arm moving in front of pt  but unable to locate stationary objects with left eye covered. Will continue to assess.  Cognition  Overall Cognitive Status: Appears within functional limits for tasks assessed/performed Arousal/Alertness: Awake/alert Orientation Level: Appears intact for  tasks assessed Behavior During Session: [facility] for tasks performed    Extremity/Trunk Assessment Right Upper Extremity Assessment RUE ROM/Strength/Tone: [facility] for tasks assessed Left Upper Extremity Assessment LUE ROM/Strength/Tone: [facility] for tasks assessed Right Lower Extremity Assessment RLE ROM/Strength/Tone: [facility] for tasks assessed Left Lower Extremity Assessment LLE ROM/Strength/Tone: [facility] for tasks assessed Trunk Assessment Trunk Assessment: Normal   Mobility  Shoulder Instructions  Bed Mobility Bed Mobility: Sit to Supine Supine to Sit: 6: Modified independent (Device/Increase time) Sit to Supine: 6: Modified independent (Device/Increase time) Details for Bed Mobility Assistance: Pt required increased time to perform task but no physical assistance. Transfers Sit to Stand: 4: Min guard;From chair/3-in-1;From toilet;With upper extremity assist Stand to Sit: 4: Min guard;To bed;To toilet;With upper extremity assist Details for Transfer Assistance: Pt slightly unsteady but no LOB.        Exercise     Balance     End of Session OT - End of Session Activity Tolerance: Patient tolerated treatment well Patient left: in bed;with call bell/phone within reach;with family/visitor present Nurse Communication: Mobility status  GO    10/07/2011 Cipriano Mile OTR/L Pager [phone] Office [phone]  Cipriano Mile 10/07/2011, 5:06 PM

## 2011-10-07 NOTE — Evaluation (Signed)
Physical Therapy Evaluation Patient Details Name: Allison [name] MRN: [id number] DOB: May 14, 1926 Today's Date: 10/07/2011 Time: 4098-1191 PT Time Calculation (min): 20 min  PT Assessment / Plan / Recommendation Clinical Impression  Pt is a 78 yo female admitted for right visual field loss and feeling nauseated. Per pt's chart, pt has suspected occluded right retinal artery. Pt presents to physical therapy evaluation with decreased mobility secondary to vision loss causing balance deficits. Pt was eucated about the importance of turning head to scan environment, especially to the right. Pt will benefit from physical therapy in the acute care setting to maximize function and independence prior to discharge home. Recommending outpatient PT at discharge.    PT Assessment  Patient needs continued PT services    Follow Up Recommendations  Outpatient PT; supervision for OOB/mobility    Barriers to Discharge        Equipment Recommendations  None recommended by PT    Recommendations for Other Services     Frequency Min 3X/week    Precautions / Restrictions Precautions Precautions: Fall Precaution Comments: decreased vision in right eye Restrictions Weight Bearing Restrictions: No         Mobility  Bed Mobility Bed Mobility: Supine to Sit Supine to Sit: 6: Modified independent (Device/Increase time) Details for Bed Mobility Assistance: Pt required increased time to perform task but no physical assistance. Transfers Transfers: Sit to Stand;Stand to Sit Sit to Stand: 4: Min guard Stand to Sit: 4: Min guard Details for Transfer Assistance: Pt with one instance of LOB upon standing, pt self corrected. Ambulation/Gait Ambulation/Gait Assistance: 4: Min guard Assistive device: None Ambulation/Gait Assistance Details: Pt requried verbal cueing to avoid obstacles on right (door frame). Pt educated on importance of turning head for full scan of environment due to right vision loss. Gait  Pattern: Step-through pattern;Decreased stride length;Decreased trunk rotation Gait velocity: decreased gait speed Stairs: No Modified Rankin (Stroke Patients Only) Pre-Morbid Rankin Score: No symptoms Modified Rankin: Moderately severe disability    Exercises     PT Diagnosis: Difficulty walking;Abnormality of gait  PT Problem List: Decreased activity tolerance;Decreased balance;Decreased mobility;Decreased safety awareness PT Treatment Interventions: Gait training;Stair training;Functional mobility training;Therapeutic activities;Therapeutic exercise;Balance training;Neuromuscular re-education;Patient/family education;DME instruction   PT Goals Acute Rehab PT Goals PT Goal Formulation: With patient Time For Goal Achievement: 10/21/11 Potential to Achieve Goals: Good Pt will go Sit to Stand: with modified independence PT Goal: Sit to Stand - Progress: Goal set today Pt will go Stand to Sit: with modified independence PT Goal: Stand to Sit - Progress: Goal set today Pt will Ambulate: >150 feet;with least restrictive assistive device;with modified independence PT Goal: Ambulate - Progress: Goal set today Pt will Go Up / Down Stairs: 1-2 stairs;with modified independence;with rail(s) PT Goal: Up/Down Stairs - Progress: Goal set today  Visit Information  Last PT Received On: 10/07/11 Assistance Needed: +1    Subjective Data  Subjective: "I am doing pretty good-it could be worse, I guess."   Prior Functioning  Home Living Lives With: Spouse Available Help at Discharge: Family;Available 24 hours/day Type of Home: House Home Access: Stairs to enter [employer] of Steps: 2 Entrance Stairs-Rails: Right Home Layout: One level Bathroom Shower/Tub: Engineer, manufacturing systems: Standard Home Adaptive Equipment: Bedside commode/3-in-1;Straight cane;Walker - rolling Prior Function Level of Independence: Independent Able to Take Stairs?: Yes Driving: Yes Vocation:  Retired Musician: No difficulties Dominant Hand: Right    Cognition  Overall Cognitive Status: Appears within functional limits for tasks assessed/performed  Arousal/Alertness: Awake/alert Orientation Level: Appears intact for tasks assessed Behavior During Session: [facility] for tasks performed    Extremity/Trunk Assessment Right Upper Extremity Assessment RUE ROM/Strength/Tone: [facility] for tasks assessed Left Upper Extremity Assessment LUE ROM/Strength/Tone: WFL for tasks assessed Right Lower Extremity Assessment RLE ROM/Strength/Tone: [facility] for tasks assessed Left Lower Extremity Assessment LLE ROM/Strength/Tone: WFL for tasks assessed Trunk Assessment Trunk Assessment: Normal   Balance    End of Session PT - End of Session Equipment Utilized During Treatment: Gait belt Activity Tolerance: Patient tolerated treatment well Patient left: in chair;with call bell/phone within reach;with family/visitor present Nurse Communication: Mobility status  GP     Jaxxson Cavanah 10/07/2011, 4:16 PM

## 2011-10-07 NOTE — ED Notes (Signed)
Pt to ED c/o being sent here by a retinal specialist for permanent loss of eye sight.  Monday at 6 pm she experienced acute onset R sided vision loss.  She states she can see light, but no shapes.  She went to an opthomologist Tues to sent her to a retinal specialist who sent her here for a R coratid [place].  Pt R pupil non-reactive to light.  Pt denies pain. Pt continues to deny sight to R eye.

## 2011-10-07 NOTE — ED Notes (Signed)
Dr Campbell at bedside.

## 2011-10-07 NOTE — Care Management Note (Signed)
    Page 1 of 2   10/09/2011     4:59:41 PM   CARE MANAGEMENT NOTE 10/09/2011  Patient:  Allison [name], Allison [name]   Account Number:  [id number]  Date Initiated:  10/07/2011  Documentation initiated by:  Onnie Boer  Subjective/Objective Assessment:   PT WAS ADMITTED WITH BLINDNESS IN HER EYE     Action/Plan:   PROGRESSION OF CARE AND DISCHARGE PLANNING   Anticipated DC Date:  10/09/2011   Anticipated DC Plan:  HOME/SELF CARE      DC Planning Services  CM consult      Choice offered to / List presented to:  C-1 Patient   DME arranged  Levan Hurst      DME agency  [facility]     HH arranged  HH-2 PT  HH-3 OT      [facility] agency  [facility]   Status of service:  Completed, signed off Medicare Important Message given?   (If response is "NO", the following Medicare IM given date fields will be blank) Date Medicare IM given:   Date Additional Medicare IM given:    Discharge Disposition:  HOME W HOME HEALTH SERVICES  Per UR Regulation:  Reviewed for med. necessity/level of care/duration of stay  If discussed at Long Length of Stay Meetings, dates discussed:    Comments:  10/09/11 Onnie Boer, RN, BSN 1658 PT WAS DC'D TO HOME WITH [facility] PT/OT AND A RW FROM [facility]. PHYSICAL THERAPY IS TO EVAL THE NEED FOR A SHOWER BENCH ONCE THEY SEE HER.  10/07/11 Josphine Laffey, RN,BSN 1227 PT WAS ADMITTED WITH BLINDNESS, PTA PT WAS AT HOME WITH SELF CARE.  WILL F/U ON RECOMMENDATIONS AND DC NEEDS.

## 2011-10-07 NOTE — Evaluation (Signed)
I have read and agree with the below assessment and plan.   Maryana Pittmon Helen Whitlow PT, DPT Pager: [phone] 

## 2011-10-07 NOTE — ED Notes (Signed)
PT back from CT.  Continues to deny any pain.

## 2011-10-07 NOTE — Progress Notes (Signed)
Subjective: Pt states that her loss of vision began approximately 2 days ago and has not progressed but has not improved. She was seen yesterday by retinal specialist Dr. Allyne Gee who informed the patient that she had a Central  Retinal Artery occlusion. The patient was suspected to have Carotid Artery occlusion and was scheduled for an out patient Carotid doppler. However, she had symptoms of dizziness and lightheadedness last night and was directed to come to the ER by the Practice Physician on call.  Pt's son is at the bedside but is unable to offer much information. Objective: Filed Vitals:   10/07/11 0848 10/07/11 1002 10/07/11 1226 10/07/11 1400  BP: 129/59 142/51 133/52 124/45  Pulse: 58 59 60 63  Temp: 98.1 F (36.7 C) 98.3 F (36.8 C) 98.1 F (36.7 C) 98.1 F (36.7 C)  TempSrc: Oral Oral Oral Oral  Resp: 18 18 18 18   Height:      Weight:      SpO2: 94% 96% 98% 98%   Weight change:  No intake or output data in the 24 hours ending 10/07/11 1455  General: Alert, awake, oriented x3, in no acute distress.  HEENT: [place]/AT PEERL, EOMI Neck: Trachea midline,  no masses, no thyromegal,y no JVD, no carotid bruit OROPHARYNX:  Moist, No exudate/ erythema/lesions.  Heart: Regular rate and rhythm, without murmurs, rubs, gallops, PMI non-displaced, no heaves or thrills on palpation.  Lungs: Clear to auscultation, no wheezing or rhonchi noted. No increased vocal fremitus resonant to percussion  Abdomen: Soft, nontender, nondistended, positive bowel sounds, no masses no hepatosplenomegaly noted..  Neuro: Pt has focal blindness of right eye but is able to see the shadows of her fingers if held close and left eye covered. Pt unable to see out of right eye is left eye open. Cranial nerves IV through XII grossly intact. DTRs 2+ bilaterally upper and lower extremities. Strength functional in bilateral upper and lower extremities.       Lab Results:  Basename 10/07/11 0635 10/06/11 2320  NA --  145  K -- 3.6  CL -- 107  CO2 -- 28  GLUCOSE -- 110*  BUN -- 12  CREATININE 0.78 0.77  CALCIUM -- 9.9  MG -- --  PHOS -- --   No results found for this basename: AST:2,ALT:2,ALKPHOS:2,BILITOT:2,PROT:2,ALBUMIN:2 in the last 72 hours No results found for this basename: LIPASE:2,AMYLASE:2 in the last 72 hours  Basename 10/07/11 0635 10/06/11 2320  WBC 7.0 10.5  NEUTROABS -- 7.1  HGB 13.0 13.5  HCT 38.8 40.1  MCV 89.8 89.9  PLT 248 263   No results found for this basename: CKTOTAL:3,CKMB:3,CKMBINDEX:3,TROPONINI:3 in the last 72 hours No components found with this basename: POCBNP:3 No results found for this basename: DDIMER:2 in the last 72 hours No results found for this basename: HGBA1C:2 in the last 72 hours No results found for this basename: CHOL:2,HDL:2,LDLCALC:2,TRIG:2,CHOLHDL:2,LDLDIRECT:2 in the last 72 hours No results found for this basename: TSH,T4TOTAL,FREET3,T3FREE,THYROIDAB in the last 72 hours No results found for this basename: VITAMINB12:2,FOLATE:2,FERRITIN:2,TIBC:2,IRON:2,RETICCTPCT:2 in the last 72 hours  Micro Results: No results found for this or any previous visit (from the past 240 hour(s)).  Studies/Results: Ct Head Wo Contrast  10/07/2011  *RADIOLOGY REPORT*  Clinical Data: 78 year old with right eye vision loss and headache.  CT HEAD WITHOUT CONTRAST  Technique:  Contiguous axial images were obtained from the base of the skull through the vertex without contrast.  Comparison: None.  Findings: There is no evidence of acute intracranial hemorrhage, mass  effect, brain edema or extra-axial fluid collection.  The ventricles and subarachnoid spaces are diffusely prominent consistent with mild atrophy. Diffuse periventricular low density is most compatible with moderate chronic small vessel ischemic change.   No acute cortical infarction is seen.  The visualized paranasal sinuses are clear. The calvarium is intact. The orbits are incompletely imaged.  The right lens  is not visualized, but the globe is incompletely seen.  There is no evidence of globe injury.  IMPRESSION:  1.  No acute intracranial findings. 2.  Atrophy and chronic small vessel ischemic changes. 3.  Clinical correlation regarding the right eye visual loss recommended.  The globes are incompletely visualized.   Original Report Authenticated By: Gerrianne Scale, M.D.     Medications: I have reviewed the patient's current medications. Scheduled Meds:   . amLODipine  5 mg Oral Daily  . aspirin  325 mg Oral Daily  . atorvastatin  20 mg Oral q1800  . cycloSPORINE  1 drop Both Eyes BID  . enoxaparin  40 mg Subcutaneous Q24H  . escitalopram  10 mg Oral Daily  . OLANZapine  2.5 mg Oral Daily  . pantoprazole  40 mg Oral Q1200  . white petrolatum       Continuous Infusions:  PRN Meds:.ondansetron (ZOFRAN) IV, senna-docusate Assessment/Plan: Patient Active Hospital Problem List: possible Embolic stroke (76/07/1094)   Assessment: In light of Dr. Allyne Gee report of central retinal artery occlusion it is very likely the patient may have had an embolic CVA with the origins of the carotid artery. The patient is scheduled to have a carotid artery duplex in the next 45 minutes and I will await the results. An MRI is also pending on this patient I will follow up on the results I've also asked neurology to see the patient and offer further recommendations. We'll also consider adding Plavix in light of new information. Continue on statin.   HTN (hypertension) (10/07/2011)   Assessment: Blood pressure currently well controlled.    acute Blindness  right eye (10/07/2011)   Assessment: Per Dr. Allyne Gee retinal specialists central retinal artery occlusion visualized on direct ophthalmologic examination.    Hyperlipemia (10/07/2011)   Assessment: Lipid panel ordered.  Results not yet available.   Vasculopathy (10/07/2011)   Assessment: See    Tobacco abuse (10/07/2011)   Assessment: Patient counseled against  further tobacco use. Does not feel that she needs a Nicoderm transdermal patch for nicotine withdrawal symptoms at present.    Plan:    LOS: 0 days

## 2011-10-07 NOTE — ED Notes (Signed)
Pt is on fx bedpan

## 2011-10-07 NOTE — H&P (Signed)
[facility] History and Physical  Allison [name]  ZOX:[id number]  DOB: 1926/05/07   DOA: 10/07/2011   PCP:   Londell Moh, MD   Chief Complaint:  Acute blindness right eye since 6:15 PM, Monday  HPI: Allison [name] is an 78 y.o. female.   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 A 2 in the evening patient began to feel much sick, noticed that her blood pressure is more elevated than usual, called her doctors who recommended that she come to the emergency room to get a carotid ultrasound.  After listening to the history emergency room physician of felt she should be admitted by the hospitalist and we were called for assistance.  Patient denies any current or recent past acute problem such as headache dizziness, slurred speech, difficulty swallowing or difficulty walking. She usually ambulates without difficulty, and drives without difficulty, but has not been driving since the onset of her acute monocular blindness  Rewiew of Systems:   All systems negative except as marked bold or noted in the HPI;  Constitutional: Negative for malaise, fever and chills. ;  Eyes: Negative for eye pain, redness and discharge. ;  ENMT: Negative for ear pain, hoarseness, nasal congestion, sinus pressure and sore throat. ;  Cardiovascular: Negative for chest pain, palpitations, diaphoresis, dyspnea and peripheral edema. ;  Respiratory: Negative for cough, hemoptysis,  wheezing and stridor. ;  Gastrointestinal: Negative for nausea, vomiting, diarrhea, constipation, abdominal pain, melena, blood in stool, hematemesis, jaundice and rectal bleeding. unusual weight loss..   Genitourinary: Negative for frequency, dysuria, incontinence,flank pain and hematuria; Musculoskeletal: Negative for back pain and neck pain. Negative for swelling and trauma.;  Skin: . Negative for pruritus, rash, abrasions, bruising and skin lesion.; ulcerations Neuro: Negative for headache, lightheadedness and neck stiffness. Negative for weakness, altered level of consciousness , altered mental status, extremity weakness, burning feet, involuntary movement, seizure and syncope.  Psych: negative for , , insomnia, tearfulness, panic attacks, hallucinations, paranoia, suicidal or homicidal ideation   Past Medical History  Diagnosis Date  . HTN (hypertension)   . Aortic aneurysm   . Transient ischemic attack   . Murmur, cardiac     Past Surgical History  Procedure Date  . Abdominal aortic aneurysm repair 2005    Medications:  HOME MEDS: Prior to Admission medications   Medication Sig Start Date End Date Taking? Authorizing Provider  amLODipine (NORVASC) 5 MG tablet Take 5 mg by mouth daily.   Yes Historical Provider, MD  calcium-vitamin D (OSCAL WITH D) 500-200 MG-UNIT per tablet Take 1 tablet by mouth daily.   Yes Historical Provider, MD  co-enzyme Q-10 30 MG capsule Take 30 mg by mouth daily.   Yes Historical Provider, MD  cycloSPORINE (RESTASIS) 0.05 % ophthalmic emulsion Place 1 drop into both eyes 2 (two) times daily.   Yes Historical Provider, MD  escitalopram (LEXAPRO) 20 MG tablet Take 20 mg by mouth daily.  Yes Historical Provider, MD  OLANZapine (ZYPREXA) 2.5 MG tablet Take 2.5 mg by mouth daily.   Yes Historical Provider, MD  olmesartan (BENICAR) 40 MG tablet Take 40 mg by mouth daily.   Yes Historical Provider, MD  pantoprazole (PROTONIX) 40 MG tablet Take 40 mg by  mouth daily.   Yes Historical Provider, MD  Polyethyl Glycol-Propyl Glycol (SYSTANE FREE OP) Place 1 drop into both eyes daily as needed. For dry eyes   Yes Historical Provider, MD  rosuvastatin (CRESTOR) 10 MG tablet Take 10 mg by mouth daily.   Yes Historical Provider, MD     Allergies:  Allergies  Allergen Reactions  . Cozaar (Losartan)     High blood pressure  . Demerol (Meperidine)     unknown  . Hydromorphone     Hives   . Ivp Dye (Iodinated Diagnostic Agents)     rash  . Penicillins     rash    Social History:   reports that she has been smoking.  She does not have any smokeless tobacco history on file. She reports that she does not drink alcohol or use illicit drugs.  Family History: Family History  Problem Relation Age of Onset  . Asthma Brother   . Diabetes Mother   . Coronary artery disease Mother     died age 71  . Coronary artery disease Father     died age 55  . Coronary artery disease Brother   . Coronary artery disease Sister      Physical Exam: Filed Vitals:   10/06/11 2308 10/07/11 0152  BP: 152/62   Pulse: 62 58  Temp: 97.5 F (36.4 C)   TempSrc: Oral   Resp: 20 13  SpO2: 96% 96%   Blood pressure 152/62, pulse 58, temperature 97.5 F (36.4 C), temperature source Oral, resp. rate 13, SpO2 96.00%.  GEN:  Pleasant elderly Caucasian lady lying in the stretcher in no acute distress; cooperative with exam PSYCH:  alert and oriented x4; does not appear anxious or depressed; affect is appropriate. HEENT: Mucous membranes pink and anicteric; right eye has light perception only; right pupil nonreactive to light; but does show consensual pupillary reaction; left pupil shows normal reaction; EOM intact; no cervical lymphadenopathy nor thyromegaly or carotid bruit; no JVD; Breasts:: Not examined CHEST WALL: No tenderness CHEST: Normal respiration, clear to auscultation bilaterally HEART: Regular rate and rhythm; 3/6 systolic murmur radiating to the  carotids rubs or gallops BACK:  no CVA tenderness ABDOMEN:  soft non-tender; no masses, no organomegaly, normal abdominal bowel sounds; no pannus; no intertriginous candida. Rectal Exam: Not done EXTREMITIES:  age-appropriate arthropathy of the hands and knees; no edema; no ulcerations. Genitalia: not examined PULSES: 2+ and symmetric SKIN: Normal hydration no rash or ulceration CNS: Cranial nerves 2-12 grossly intact no focal lateralizing neurologic deficit   Labs on Admission:  Basic Metabolic Panel:  Lab 10/06/11 1610  NA 145  K 3.6  CL 107  CO2 28  GLUCOSE 110*  BUN 12  CREATININE 0.77  CALCIUM 9.9  MG --  PHOS --   Liver Function Tests: No results found for this basename: AST:5,ALT:5,ALKPHOS:5,BILITOT:5,PROT:5,ALBUMIN:5 in the last 168 hours No results found for this basename: LIPASE:5,AMYLASE:5 in the last 168 hours No results found for this basename: AMMONIA:5 in the last 168 hours CBC:  Lab 10/06/11 2320  WBC 10.5  NEUTROABS 7.1  HGB 13.5  HCT 40.1  MCV 89.9  PLT 263   Cardiac Enzymes: No results found  for this basename: CKTOTAL:5,CKMB:5,CKMBINDEX:5,TROPONINI:5 in the last 168 hours BNP: No components found with this basename: POCBNP:5 CBG: No results found for this basename: GLUCAP:5 in the last 168 hours  Results for orders placed during the hospital encounter of 10/07/11 (from the past 48 hour(s))  CBC WITH DIFFERENTIAL     Status: Abnormal   Collection Time   10/06/11 11:20 PM      Component Value Range Comment   WBC 10.5  4.0 - 10.5 K/uL    RBC 4.46  3.87 - 5.11 MIL/uL    Hemoglobin 13.5  12.0 - 15.0 g/dL    HCT 40.9  81.1 - 91.4 %    MCV 89.9  78.0 - 100.0 fL    MCH 30.3  26.0 - 34.0 pg    MCHC 33.7  30.0 - 36.0 g/dL    RDW 78.2  95.6 - 21.3 %    Platelets 263  150 - 400 K/uL    Neutrophils Relative 68  43 - 77 %    Neutro Abs 7.1  1.7 - 7.7 K/uL    Lymphocytes Relative 19  12 - 46 %    Lymphs Abs 2.0  0.7 - 4.0 K/uL    Monocytes Relative 10   3 - 12 %    Monocytes Absolute 1.1 (*) 0.1 - 1.0 K/uL    Eosinophils Relative 2  0 - 5 %    Eosinophils Absolute 0.3  0.0 - 0.7 K/uL    Basophils Relative 0  0 - 1 %    Basophils Absolute 0.0  0.0 - 0.1 K/uL   BASIC METABOLIC PANEL     Status: Abnormal   Collection Time   10/06/11 11:20 PM      Component Value Range Comment   Sodium 145  135 - 145 mEq/L    Potassium 3.6  3.5 - 5.1 mEq/L    Chloride 107  96 - 112 mEq/L    CO2 28  19 - 32 mEq/L    Glucose, Bld 110 (*) 70 - 99 mg/dL    BUN 12  6 - 23 mg/dL    Creatinine, Ser 0.86  0.50 - 1.10 mg/dL    Calcium 9.9  8.4 - 57.8 mg/dL    GFR calc non Af Amer 76 (*) >90 mL/min    GFR calc Af Amer 88 (*) >90 mL/min   APTT     Status: Normal   Collection Time   10/07/11  1:25 AM      Component Value Range Comment   aPTT 30  24 - 37 seconds   PROTIME-INR     Status: Normal   Collection Time   10/07/11  1:25 AM      Component Value Range Comment   Prothrombin Time 13.4  11.6 - 15.2 seconds    INR 1.00  0.00 - 1.49      Radiological Exams on Admission: Ct Head Wo Contrast  10/07/2011  *RADIOLOGY REPORT*  Clinical Data: 78 year old with right eye vision loss and headache.  CT HEAD WITHOUT CONTRAST  Technique:  Contiguous axial images were obtained from the base of the skull through the vertex without contrast.  Comparison: None.  Findings: There is no evidence of acute intracranial hemorrhage, mass effect, brain edema or extra-axial fluid collection.  The ventricles and subarachnoid spaces are diffusely prominent consistent with mild atrophy. Diffuse periventricular low density is most compatible with moderate chronic small vessel ischemic change.   No acute cortical infarction is seen.  The visualized paranasal sinuses are clear. The calvarium is intact. The orbits are incompletely imaged.  The right lens is not visualized, but the globe is incompletely seen.  There is no evidence of globe injury.  IMPRESSION:  1.  No acute intracranial findings. 2.   Atrophy and chronic small vessel ischemic changes. 3.  Clinical correlation regarding the right eye visual loss recommended.  The globes are incompletely visualized.   Original Report Authenticated By: Gerrianne Scale, M.D.       Assessment/Plan Present on Admission:  .HTN (hypertension) .possible Embolic stroke .acute Blindness  right eye possibly secondary to the  .Hyperlipemia .Depression .PUD (peptic ulcer disease) .Vasculopathy .Tobacco abuse   PLAN: Plan we'll admit this lady on the stroke pathway, Ensure workup to try prevent any secondary stroke. Will get MRI MRA of the brain; Emergency room physician has already consulted neurologist and we await their input  Other plans as per orders.  Code Status: Full, to be reviewed. Family Communication: Step-daughter Dewitt Rota [phone], present for most of the interview;   son, Arnette Norris, is a Wellsite geologist at a facility, is planning on coming to hospital tomorrow [phone].     Disposition Plan: Pending results of investigations and neurology input  Khadir Roam Nocturnist [facility] Pager [phone]   10/07/2011, 4:27 AM

## 2011-10-07 NOTE — ED Notes (Addendum)
Pt to CT

## 2011-10-07 NOTE — Progress Notes (Signed)
  Echocardiogram 2D Echocardiogram has been performed.  Allison [name] 10/07/2011, 10:55 AM

## 2011-10-07 NOTE — ED Provider Notes (Signed)
History     CSN: [id number]  Arrival date & time 10/06/11  2302   First MD Initiated Contact with Patient 10/07/11 0012      Chief Complaint  Patient presents with  . Loss of Vision    (Consider location/radiation/quality/duration/timing/severity/associated sxs/prior treatment) HPI Comments: The patient experienced sudden vision loss in the right eye on the evening of 9/2.  She was seen by her pcp, then sent to her opthalmologist, who in turn referred to a retina specialist.  She was told she "lost the vision in her eye", but is unsure of what the reason for this was.  There are no records for me to view in Epic.  She called her pcp this evening and told him that she just didn't feel well and was sent here for evaluation.  She was told by her pcp to come here and have an ultrasound of the carotids as soon as possible.  She denies headache or numbness or weakness of the face or extremities.  There are no other complaints.  The history is provided by the patient.    Past Medical History  Diagnosis Date  . HTN (hypertension)   . Aortic aneurysm   . Transient ischemic attack   . Murmur, cardiac     History reviewed. No pertinent past surgical history.  History reviewed. No pertinent family history.  History  Substance Use Topics  . Smoking status: Current Some Day Smoker  . Smokeless tobacco: Not on file  . Alcohol Use: No    OB History    Grav Para Term Preterm Abortions TAB SAB Ect Mult Living                  Review of Systems  Eyes: Positive for visual disturbance.  All other systems reviewed and are negative.    Allergies  Demerol; Hydromorphone; Ivp dye; and Penicillins  Home Medications  No current outpatient prescriptions on file.  BP 152/62  Pulse 62  Temp 97.5 F (36.4 C) (Oral)  Resp 20  SpO2 96%  Physical Exam  Nursing note and vitals reviewed. Constitutional: She is oriented to person, place, and time. She appears well-developed and  well-nourished. No distress.  HENT:  Head: Normocephalic and atraumatic.  Eyes:       The right eye is noted to have a mid-fixed pupil.  The left pupil is reactive.  She cannot see out of her right eye at all, the left has normal sight.  The right eye seems to lag behind when testing eye movements.  Neck: Normal range of motion. Neck supple.  Cardiovascular: Normal rate and regular rhythm.  Exam reveals no gallop and no friction rub.   No murmur heard. Pulmonary/Chest: Effort normal and breath sounds normal. No respiratory distress. She has no wheezes.  Abdominal: Soft. Bowel sounds are normal. She exhibits no distension. There is no tenderness.  Musculoskeletal: Normal range of motion.  Neurological: She is alert and oriented to person, place, and time.  Skin: Skin is warm and dry. She is not diaphoretic.    ED Course  Procedures (including critical care time)  Labs Reviewed  CBC WITH DIFFERENTIAL - Abnormal; Notable for the following:    Monocytes Absolute 1.1 (*)     All other components within normal limits  BASIC METABOLIC PANEL - Abnormal; Notable for the following:    Glucose, Bld 110 (*)     GFR calc non Af Amer 76 (*)     GFR calc Af  Amer 88 (*)     All other components within normal limits   No results found.   No diagnosis found.   Date: 10/07/2011  Rate: 80's  Rhythm: normal sinus rhythm  QRS Axis: normal  Intervals: normal  ST/T Wave abnormalities: normal  Conduction Disutrbances:none  Narrative Interpretation:   Old EKG Reviewed: unchanged    MDM  The patient presents here two days after suddenly losing vision in her right eye.  She has a history of known carotid artery stenosis and it sounds to me as thought this was some sort of embolic event.  She has not had her carotids studied in about two years.  She was sent here by her pcp for expedited workup into this episode.  On exam, the vitals are stable and the patient is afebrile.  The right pupil is mid  fixed and non-reactive.  A ct of her head was performed as were lab studies.  These were unremarkable.  She does reports having the same visual aura in the left eye that preceded her loss of vision in the right.  I am unsure whether she requires anticoagulation or steroids to help prevent this same phenomenon in her left eye.  I will consult [facility] for admission as I believe this workup and plan requires emergent workup to prevent blindness.  Dr. Orvan Falconer has seen the patient and will admit her to the [facility].          Geoffery Lyons, MD 10/07/11 2221

## 2011-10-08 ENCOUNTER — Inpatient Hospital Stay (HOSPITAL_COMMUNITY): Payer: Medicare Other

## 2011-10-08 DIAGNOSIS — I6789 Other cerebrovascular disease: Secondary | ICD-10-CM | POA: Diagnosis not present

## 2011-10-08 DIAGNOSIS — I10 Essential (primary) hypertension: Secondary | ICD-10-CM | POA: Diagnosis not present

## 2011-10-08 DIAGNOSIS — H5440 Blindness, one eye, unspecified eye: Secondary | ICD-10-CM | POA: Diagnosis not present

## 2011-10-08 DIAGNOSIS — I6340 Cerebral infarction due to embolism of unspecified cerebral artery: Secondary | ICD-10-CM | POA: Diagnosis not present

## 2011-10-08 DIAGNOSIS — I6509 Occlusion and stenosis of unspecified vertebral artery: Secondary | ICD-10-CM | POA: Diagnosis not present

## 2011-10-08 DIAGNOSIS — R300 Dysuria: Secondary | ICD-10-CM | POA: Diagnosis not present

## 2011-10-08 DIAGNOSIS — I6529 Occlusion and stenosis of unspecified carotid artery: Secondary | ICD-10-CM | POA: Diagnosis not present

## 2011-10-08 LAB — LIPID PANEL: Cholesterol: 129 mg/dL (ref 0–200)

## 2011-10-08 LAB — URINALYSIS, ROUTINE W REFLEX MICROSCOPIC
Glucose, UA: NEGATIVE mg/dL
Hgb urine dipstick: NEGATIVE
Leukocytes, UA: NEGATIVE
Specific Gravity, Urine: 1.012 (ref 1.005–1.030)
pH: 6.5 (ref 5.0–8.0)

## 2011-10-08 LAB — HEMOGLOBIN A1C
Hgb A1c MFr Bld: 5.7 % — ABNORMAL HIGH (ref <5.7)
Mean Plasma Glucose: 117 mg/dL — ABNORMAL HIGH (ref <117)

## 2011-10-08 MED ORDER — GADOBENATE DIMEGLUMINE 529 MG/ML IV SOLN
13.0000 mL | Freq: Once | INTRAVENOUS | Status: AC | PRN
  Administered 2011-10-08: 13 mL via INTRAVENOUS

## 2011-10-08 NOTE — Progress Notes (Signed)
Occupational Therapy Treatment Patient Details Name: Allison [name] MRN: [id number] DOB: May 20, 1926 Today's Date: 10/08/2011 Time: 4098-1191 OT Time Calculation (min): 21 min  OT Assessment / Plan / Recommendation Comments on Treatment Session Pt reports today that her husband is expecting upcoming surgery and may not be able to drive her to OPOT.  Recommending HHOT at this time as pt will not have access to OPOT but requires f/u OT services.    Follow Up Recommendations  Home health OT;Supervision/Assistance - 24 hour    Barriers to Discharge       Equipment Recommendations  Tub/shower seat    Recommendations for Other Services    Frequency Min 2X/week   Plan Discharge plan needs to be updated    Precautions / Restrictions Precautions Precautions: Fall Precaution Comments: decreased vision in right eye   Pertinent Vitals/Pain See vitals    ADL  Toilet Transfer: Simulated;Min guard;Minimal assistance Toilet Transfer Method: Sit to Barista:  (chair) Tub/Shower Transfer: Performed;Minimal assistance Tub/Shower Transfer Method: Ambulating Equipment Used: Gait belt Transfers/Ambulation Related to ADLs: Min guard- min assist during HHA ambulation to therapy gym.  Pt with lateral sway and LOBx1.  When given RW, pt min guard and much more steady. ADL Comments: Pt continues to demonstrate balance deficts during functional mobility.  Min assist for tub transfer for steadying and safety.  Discussed use of seat in tub and pt agrees this is a safe option. While ambulating in hallway, pt able to locate objects and maneuver through narrow spaces with RW with increased time and turning head to right to ensure she was identifying her entire environment.    OT Diagnosis:    OT Problem List:   OT Treatment Interventions:     OT Goals ADL Goals Pt Will Transfer to Toilet: with supervision;Ambulation;Comfort height toilet ADL Goal: Toilet Transfer - Progress:  Progressing toward goals Pt Will Perform Tub/Shower Transfer: Tub transfer;with supervision;Ambulation ADL Goal: Tub/Shower Transfer - Progress: Progressing toward goals Miscellaneous OT Goals Miscellaneous OT Goal #2: Pt will independently use compensatory vision techniques to safely perform functional mobility. OT Goal: Miscellaneous Goal #2 - Progress: Progressing toward goals  Visit Information  Last OT Received On: 10/08/11    Subjective Data      Prior Functioning       Cognition  Overall Cognitive Status: Appears within functional limits for tasks assessed/performed Arousal/Alertness: Awake/alert Orientation Level: Appears intact for tasks assessed Behavior During Session: [facility] for tasks performed    Mobility  Shoulder Instructions Bed Mobility Bed Mobility: Not assessed Transfers Transfers: Sit to Stand;Stand to Sit Sit to Stand: 4: Min assist;From chair/3-in-1;With upper extremity assist Stand to Sit: 4: Min guard;To chair/3-in-1 Details for Transfer Assistance: Pt with LOB to left side upon standing. Assist to regain balance.       Exercises      Balance     End of Session OT - End of Session Activity Tolerance: Patient tolerated treatment well Patient left: in chair;with call bell/phone within reach  GO    10/08/2011 Cipriano Mile OTR/L Pager [phone] Office [phone]  Cipriano Mile 10/08/2011, 12:49 PM

## 2011-10-08 NOTE — Progress Notes (Signed)
I agree with the below change in d/c plan with the addition that pt have supervision/assist for OOB mobility upon d/c home. Ivonne Andrew PT, DPT Pager: [phone]

## 2011-10-08 NOTE — Progress Notes (Signed)
Subjective: Pt denies any progression or symptoms or change in vision. Her only other complaint is dysuria.  Objective: Filed Vitals:   10/07/11 2124 10/08/11 0700 10/08/11 1026 10/08/11 1612  BP: 139/54 143/58 131/41 128/47  Pulse: 59 62 62 51  Temp:  97.9 F (36.6 C) 97.7 F (36.5 C) 97.9 F (36.6 C)  TempSrc: Oral Oral  Oral  Resp: 18 18 20 20   Height:      Weight:      SpO2: 97% 94% 96% 97%   Weight change:   Intake/Output Summary (Last 24 hours) at 10/08/11 1842 Last data filed at 10/08/11 1757  Gross per 24 hour  Intake    720 ml  Output    200 ml  Net    520 ml    General: Alert, awake, oriented x3, in no acute distress.  HEENT: [place]/AT PEERL, EOMI.  Heart: Regular rate and rhythm, without murmurs, rubs, gallops, PMI non-displaced, no heaves or thrills on palpation.  Lungs: Clear to auscultation, no wheezing or rhonchi noted. No increased vocal fremitus resonant to percussion  Abdomen: Soft, nontender, nondistended, positive bowel sounds, no masses no hepatosplenomegaly noted..  Neuro: Pt has focal blindness of right eye but is able to see the shadows of her fingers if held close  Cranial nerves IV through XII grossly intact. DTRs 2+ bilaterally upper and lower extremities. Strength functional in bilateral upper and lower extremities.     Lab Results:  Basename 10/07/11 0635 10/06/11 2320  NA -- 145  K -- 3.6  CL -- 107  CO2 -- 28  GLUCOSE -- 110*  BUN -- 12  CREATININE 0.78 0.77  CALCIUM -- 9.9  MG -- --  PHOS -- --   No results found for this basename: AST:2,ALT:2,ALKPHOS:2,BILITOT:2,PROT:2,ALBUMIN:2 in the last 72 hours No results found for this basename: LIPASE:2,AMYLASE:2 in the last 72 hours  Basename 10/07/11 0635 10/06/11 2320  WBC 7.0 10.5  NEUTROABS -- 7.1  HGB 13.0 13.5  HCT 38.8 40.1  MCV 89.8 89.9  PLT 248 263   No results found for this basename: CKTOTAL:3,CKMB:3,CKMBINDEX:3,TROPONINI:3 in the last 72 hours No components found with  this basename: POCBNP:3 No results found for this basename: DDIMER:2 in the last 72 hours  Basename 10/08/11 0656 10/07/11 1738  HGBA1C 5.8* 5.7*    Basename 10/08/11 0656  CHOL 129  HDL 32*  LDLCALC 68  TRIG 161  CHOLHDL 4.0  LDLDIRECT --   No results found for this basename: TSH,T4TOTAL,FREET3,T3FREE,THYROIDAB in the last 72 hours No results found for this basename: VITAMINB12:2,FOLATE:2,FERRITIN:2,TIBC:2,IRON:2,RETICCTPCT:2 in the last 72 hours  Micro Results: No results found for this or any previous visit (from the past 240 hour(s)).  Studies/Results: Ct Head Wo Contrast  10/07/2011  *RADIOLOGY REPORT*  Clinical Data: 78 year old with right eye vision loss and headache.  CT HEAD WITHOUT CONTRAST  Technique:  Contiguous axial images were obtained from the base of the skull through the vertex without contrast.  Comparison: None.  Findings: There is no evidence of acute intracranial hemorrhage, mass effect, brain edema or extra-axial fluid collection.  The ventricles and subarachnoid spaces are diffusely prominent consistent with mild atrophy. Diffuse periventricular low density is most compatible with moderate chronic small vessel ischemic change.   No acute cortical infarction is seen.  The visualized paranasal sinuses are clear. The calvarium is intact. The orbits are incompletely imaged.  The right lens is not visualized, but the globe is incompletely seen.  There is no evidence of  globe injury.  IMPRESSION:  1.  No acute intracranial findings. 2.  Atrophy and chronic small vessel ischemic changes. 3.  Clinical correlation regarding the right eye visual loss recommended.  The globes are incompletely visualized.   Original Report Authenticated By: Gerrianne Scale, M.D.     Medications: I have reviewed the patient's current medications. Scheduled Meds:    . amLODipine  5 mg Oral Daily  . atorvastatin  20 mg Oral q1800  . clopidogrel  75 mg Oral Q breakfast  . cycloSPORINE  1  drop Both Eyes BID  . enoxaparin  40 mg Subcutaneous Q24H  . escitalopram  10 mg Oral Daily  . OLANZapine  2.5 mg Oral Daily  . pantoprazole  40 mg Oral Q1200   Continuous Infusions:  PRN Meds:.gadobenate dimeglumine, ondansetron (ZOFRAN) IV, senna-docusate Assessment/Plan: Patient Active Hospital Problem List: possible Embolic stroke (02/02/9145)   Assessment: Continue on Plavix 75 mg by mouth daily and statin. Imaging pending at this time.  Dysuria (10/08/2011) Assessment: We'll obtain urinalysis with reflex microscopy and treat accordingly.    HTN (hypertension) (10/07/2011)   Assessment: Blood pressure currently well controlled.    acute Blindness  right eye (10/07/2011)   Assessment: Per Dr. Allyne Gee retinal specialists central retinal artery occlusion visualized on direct ophthalmologic examination.    Hyperlipemia (10/07/2011)   Assessment: Lipid panel ordered.  Results not yet available.   Vasculopathy (10/07/2011)   Assessment: See above    Tobacco abuse (10/07/2011)   Assessment: Patient counseled against further tobacco use. Does not feel that she needs a Nicoderm transdermal patch for nicotine withdrawal symptoms at present.    Plan:    LOS: 1 day

## 2011-10-08 NOTE — Progress Notes (Signed)
Physical Therapy Treatment Patient Details Name: Allison [name] MRN: [id number] DOB: Jan 16, 1927 Today's Date: 10/08/2011 Time: 1308-6578 PT Time Calculation (min): 19 min  PT Assessment / Plan / Recommendation Comments on Treatment Session  Patient progressng well and is very motivated to progress with ambulation. Patient is concerned about not being able to get to OPPT and request HHPT at this time. WIll update plan    Follow Up Recommendations  Home health PT    Barriers to Discharge        Equipment Recommendations  Tub/shower seat    Recommendations for Other Services    Frequency Min 3X/week   Plan Discharge plan needs to be updated;Frequency remains appropriate    Precautions / Restrictions Precautions Precautions: Fall Precaution Comments: decreased vision in R eye   Pertinent Vitals/Pain     Mobility  Bed Mobility Bed Mobility: Not assessed Supine to Sit: 6: Modified independent (Device/Increase time) Transfers Sit to Stand: 5: Supervision Stand to Sit: 5: Supervision Details for Transfer Assistance: Pt with LOB to left side upon standing. Assist to regain balance. Ambulation/Gait Ambulation/Gait Assistance: 4: Min assist Ambulation Distance (Feet): 250 Feet Assistive device: Rolling walker Ambulation/Gait Assistance Details: Patient with one LOB initially with standing and taking first couple of steps. Patient given RW and able to progress with ambulation and increased balance. Patient with safe use of RW Gait Pattern: Step-through pattern;Decreased stride length Stairs: Yes Stairs Assistance: 4: Min guard Stair Management Technique: One rail Right;Step to pattern;Forwards Number of Stairs: 5     Exercises     PT Diagnosis:    PT Problem List:   PT Treatment Interventions:     PT Goals Acute Rehab PT Goals PT Goal: Sit to Stand - Progress: Progressing toward goal PT Goal: Stand to Sit - Progress: Progressing toward goal PT Goal: Ambulate - Progress:  Progressing toward goal PT Goal: Up/Down Stairs - Progress: Progressing toward goal  Visit Information  Last PT Received On: 10/08/11 Assistance Needed: +1    Subjective Data      Cognition  Overall Cognitive Status: Appears within functional limits for tasks assessed/performed Arousal/Alertness: Awake/alert Orientation Level: Appears intact for tasks assessed Behavior During Session: [facility] for tasks performed    Balance     End of Session PT - End of Session Equipment Utilized During Treatment: Gait belt Activity Tolerance: Patient tolerated treatment well Patient left: in chair;with call bell/phone within reach Nurse Communication: Mobility status   GP     Fredrich Birks 10/08/2011, 3:18 PM 10/08/2011 Fredrich Birks PTA [phone] pager [phone] office

## 2011-10-08 NOTE — Progress Notes (Signed)
Stroke Team Progress Note  HISTORY Allison [name] is an 78 y.o. female history of vascular problems including AAA S/P repair in 2005, hypertension and a past history of TIA, who noted on Monday 10/05/2011  around 6 pm that she had right eye visual changes. Initially she had bright white dots/lines in her right eye visual field which turned into decreased vision. She is unsure exactly how long this took. Patient called her primary MD who referred her to a ophthalmologist out patient on tuesday, who referred her to a retina specialist who visualized the central retinal artery occlusion. Patient was brought to the ED 10/07/11 due to feeling nauseated and unwell last night. Her PCP wanted her to be admitted to hospital for further evaluation of the Carotid arteries/intracranial vasculature and brain. Patient was not a TPA candidate secondary to delay in arrival. She was admitted for further evaluation and treatment.  SUBJECTIVE No family is at the bedside.  Overall she feels her condition is stable.   OBJECTIVE Most recent Vital Signs: Filed Vitals:   10/07/11 1835 10/07/11 2124 10/08/11 0700 10/08/11 1026  BP: 138/45 139/54 143/58 131/41  Pulse: 64 59 62 62  Temp: 98 F (36.7 C) 98 F (36.7 C) 97.9 F (36.6 C) 97.7 F (36.5 C)  TempSrc: Oral Oral Oral   Resp: 18 18 18 20   Height:      Weight:      SpO2: 98% 97% 94% 96%   Intake/Output from previous day: 09/04 0701 - 09/05 0700 In: -  Out: 1 [Stool:1]  MEDICATIONS    . amLODipine  5 mg Oral Daily  . atorvastatin  20 mg Oral q1800  . clopidogrel  75 mg Oral Q breakfast  . cycloSPORINE  1 drop Both Eyes BID  . enoxaparin  40 mg Subcutaneous Q24H  . escitalopram  10 mg Oral Daily  . OLANZapine  2.5 mg Oral Daily  . pantoprazole  40 mg Oral Q1200  . DISCONTD: aspirin  325 mg Oral Daily   PRN:  ondansetron (ZOFRAN) IV, senna-docusate  Diet:  Cardiac thin liquids Activity:  OOB  Up with assistance DVT Prophylaxis:  Lovenox 40 mg sq  daily   CLINICALLY SIGNIFICANT STUDIES Basic Metabolic Panel:  Lab 10/07/11 0454 10/06/11 2320  NA -- 145  K -- 3.6  CL -- 107  CO2 -- 28  GLUCOSE -- 110*  BUN -- 12  CREATININE 0.78 0.77  CALCIUM -- 9.9  MG -- --  PHOS -- --   Liver Function Tests: No results found for this basename: AST:2,ALT:2,ALKPHOS:2,BILITOT:2,PROT:2,ALBUMIN:2 in the last 168 hours CBC:  Lab 10/07/11 0635 10/06/11 2320  WBC 7.0 10.5  NEUTROABS -- 7.1  HGB 13.0 13.5  HCT 38.8 40.1  MCV 89.8 89.9  PLT 248 263   Coagulation:  Lab 10/07/11 0125  LABPROT 13.4  INR 1.00   Cardiac Enzymes: No results found for this basename: CKTOTAL:3,CKMB:3,CKMBINDEX:3,TROPONINI:3 in the last 168 hours Urinalysis: No results found for this basename: COLORURINE:2,APPERANCEUR:2,LABSPEC:2,PHURINE:2,GLUCOSEU:2,HGBUR:2,BILIRUBINUR:2,KETONESUR:2,PROTEINUR:2,UROBILINOGEN:2,NITRITE:2,LEUKOCYTESUR:2 in the last 168 hours Lipid Panel    Component Value Date/Time   CHOL 129 10/08/2011 0656   TRIG 147 10/08/2011 0656   HDL 32* 10/08/2011 0656   CHOLHDL 4.0 10/08/2011 0656   VLDL 29 10/08/2011 0656   LDLCALC 68 10/08/2011 0656   HgbA1C  Lab Results  Component Value Date   HGBA1C 5.7* 10/07/2011   Urine Drug Screen:   No results found for this basename: labopia, cocainscrnur, labbenz, amphetmu, thcu, labbarb  Alcohol Level: No results found for this basename: ETH:2 in the last 168 hours   CT of the brain  10/07/2011   1.  No acute intracranial findings. 2.  Atrophy and chronic small vessel ischemic changes. 3.  Clinical correlation regarding the right eye visual loss recommended.  The globes are incompletely visualized.    MRI of the brain    MRA of the brain    2D Echocardiogram    Carotid Doppler  Bilateral: No evidence of hemodynamically significant internal carotid artery stenosis. Vertebral artery flow is antegrade. Left vertebral artery flow is diminished with absence diastolic component.    CXR  10/07/2011  No acute findings.    EKG    Therapy Recommendations PT - none; OT - none;   Physical Exam  Pleasant middle aged lady not in distress.Awake alert. Afebrile. Head is nontraumatic. Neck is supple without bruit. Hearing is normal. Cardiac exam no murmur or gallop. Lungs are clear to auscultation. Distal pulses are well felt.  Neurological Exam : Awake  Alert oriented x 3. Normal speech and language.eye movements full without nystagmus.visual acuity severely limited in the right eye with only seeing movements at 2 feet. Right pupil is sluggishly reactive. Vision is normal in the left eye. Fundi were not visualized. Face symmetric. Tongue midline. Normal strength, tone, reflexes and coordination. Normal sensation. Gait deferred.  ASSESSMENT [name] is a 78 y.o. female presenting with change in vision in her right eye due to retinal artery occlusion. Imaging pending. Infarct felt to be thrombolic  secondary to branch vessel atherosclerosis in the eye, not in the brain. Work up underway. On aspirin 81 mg orally every day prior to admission. Now on clopidogrel 75 mg orally every day for secondary stroke prevention. Patient with resultant right eye visual loss.  -hypertension -TIA hx  Hospital day # 1  TREATMENT/PLAN -Continue clopidogrel 75 mg orally every day for secondary stroke prevention. -f/u MRI and MRA, 2D  Annie Main, MSN, RN, ANVP-BC, ANP-BC, GNP-BC [facility] Pager: [phone] 10/08/2011 10:45 AM  Scribe for Dr. Delia Heady, Stroke Center Medical Director, who has personally reviewed chart, pertinent data, examined the patient and developed the plan of care. Pager:  [phone]

## 2011-10-09 DIAGNOSIS — F17200 Nicotine dependence, unspecified, uncomplicated: Secondary | ICD-10-CM | POA: Diagnosis not present

## 2011-10-09 DIAGNOSIS — H5440 Blindness, one eye, unspecified eye: Secondary | ICD-10-CM | POA: Diagnosis not present

## 2011-10-09 DIAGNOSIS — I10 Essential (primary) hypertension: Secondary | ICD-10-CM | POA: Diagnosis not present

## 2011-10-09 DIAGNOSIS — I6340 Cerebral infarction due to embolism of unspecified cerebral artery: Secondary | ICD-10-CM | POA: Diagnosis not present

## 2011-10-09 MED ORDER — CLOPIDOGREL BISULFATE 75 MG PO TABS: 75.0000 mg | ORAL_TABLET | Freq: Every day | ORAL | Status: AC

## 2011-10-09 NOTE — Progress Notes (Signed)
Occupational Therapy Treatment Patient Details Name: Allison [name] MRN: [id number] DOB: Mar 26, 1926 Today's Date: 10/09/2011 Time: 4098-1191 OT Time Calculation (min): 17 min  OT Assessment / Plan / Recommendation Comments on Treatment Session Pt progressing towards goals.  Pt demonstrates increased balance with use of RW.  Would continue to benefit from education reinforcment for safe use of RW.    Follow Up Recommendations  Home health OT;Supervision/Assistance - 24 hour    Barriers to Discharge       Equipment Recommendations  Tub/shower seat (spoke with case manager, pt may wait for HHOT to rec DME)    Recommendations for Other Services    Frequency Min 2X/week   Plan Discharge plan remains appropriate    Precautions / Restrictions Precautions Precautions: Fall Precaution Comments: decreased vision in R eye Restrictions Weight Bearing Restrictions: No   Pertinent Vitals/Pain See vitals    ADL  Grooming: Performed;Wash/dry hands;Supervision/safety Where Assessed - Grooming: Unsupported standing Toilet Transfer: Performed;Min guard Statistician Method: Sit to Barista: Comfort height toilet Toileting - Clothing Manipulation and Hygiene: Performed;Supervision/safety Where Assessed - Engineer, mining and Hygiene: Standing Equipment Used: Gait belt;Rolling walker Transfers/Ambulation Related to ADLs: Supervision with RW. Intermittent min guard assist during turns for safety with RW. ADL Comments: Pt able to locate all items at sink. Pt required moderate cueing throughout session for proper use of RW.  Pt has tendency to leave RW and take a few steps away from it during tasks such as grooming at sink and toileting tasks.  Educated pt on proper techniques for maneuvering with RW. Pt with no LOB today.    OT Diagnosis:    OT Problem List:   OT Treatment Interventions:     OT Goals ADL Goals Pt Will Transfer to Toilet: with  supervision;Ambulation;Comfort height toilet ADL Goal: Toilet Transfer - Progress: Progressing toward goals Miscellaneous OT Goals Miscellaneous OT Goal #1: Pt will be able to retrieve ADL items with supervision and no LOB. OT Goal: Miscellaneous Goal #1 - Progress: Progressing toward goals Miscellaneous OT Goal #2: Pt will independently use compensatory vision techniques to safely perform functional mobility. OT Goal: Miscellaneous Goal #2 - Progress: Progressing toward goals Miscellaneous OT Goal #3: Pt will independently demonstrate safe use of RW during all functional mobilty. OT Goal: Miscellaneous Goal #3 - Progress: Goal set today  Visit Information  Last OT Received On: 10/09/11 Assistance Needed: +1    Subjective Data      Prior Functioning       Cognition  Overall Cognitive Status: Appears within functional limits for tasks assessed/performed Arousal/Alertness: Awake/alert Orientation Level: Appears intact for tasks assessed Behavior During Session: [facility] for tasks performed    Mobility  Shoulder Instructions Bed Mobility Bed Mobility: Sitting - Scoot to Edge of Bed;Supine to Sit Supine to Sit: 6: Modified independent (Device/Increase time) Sitting - Scoot to Edge of Bed: 7: Independent Transfers Transfers: Sit to Stand;Stand to Sit Sit to Stand: 5: Supervision;From bed;From toilet Stand to Sit: 5: Supervision;To chair/3-in-1;To toilet Details for Transfer Assistance: Verbal cueing for safest hand placement.       Exercises      Balance Balance Balance Assessed: Yes Dynamic Standing Balance Dynamic Standing - Balance Support: Left upper extremity supported;During functional activity Dynamic Standing - Level of Assistance: 5: Stand by assistance Dynamic Standing - Balance Activities: Reaching across midline;Forward lean/weight shifting (reaching for objects at sink and to open door)   End of Session OT - End of Session  Equipment Utilized During Treatment:  Gait belt Activity Tolerance: Patient tolerated treatment well Patient left: in chair;with call bell/phone within reach Nurse Communication: Mobility status  GO    10/09/2011 Cipriano Mile OTR/L Pager [phone] Office [phone]  Cipriano Mile 10/09/2011, 11:49 AM

## 2011-10-09 NOTE — Progress Notes (Signed)
Physical Therapy Treatment Patient Details Name: Allison [name] MRN: [id number] DOB: 1926/11/04 Today's Date: 10/09/2011 Time: 4098-1191 PT Time Calculation (min): 16 min  PT Assessment / Plan / Recommendation Comments on Treatment Session  Pt continues to progress well with mobility and anxious to d/c home.    Follow Up Recommendations  Home health PT    Barriers to Discharge        Equipment Recommendations  Tub/shower seat;Rolling walker with 5" wheels    Recommendations for Other Services    Frequency Min 3X/week   Plan Discharge plan needs to be updated;Frequency remains appropriate    Precautions / Restrictions Precautions Precautions: Fall Precaution Comments: decreased vision in R eye Restrictions Weight Bearing Restrictions: No   Pertinent Vitals/Pain No c/o pain.    Mobility  Bed Mobility Bed Mobility: Sit to Supine Supine to Sit: 6: Modified independent (Device/Increase time) Sitting - Scoot to Edge of Bed: 7: Independent Sit to Supine: 6: Modified independent (Device/Increase time) Transfers Transfers: Sit to Stand;Stand to Sit Sit to Stand: 5: Supervision;With upper extremity assist;With armrests;From chair/3-in-1 Stand to Sit: 5: Supervision;With upper extremity assist;With armrests;To chair/3-in-1 Details for Transfer Assistance: Cues for hand placement. Ambulation/Gait Ambulation/Gait Assistance: 4: Min guard Ambulation Distance (Feet): 250 Feet Assistive device: Rolling walker Ambulation/Gait Assistance Details: No LOB noted during ambulation.  No cues needed to avoid objects today.  Demonstrated safe use of RW. Gait Pattern: Step-through pattern Stairs: No        PT Goals Acute Rehab PT Goals PT Goal: Sit to Stand - Progress: Progressing toward goal PT Goal: Stand to Sit - Progress: Progressing toward goal PT Goal: Ambulate - Progress: Progressing toward goal  Visit Information  Last PT Received On: 10/09/11 Assistance Needed: +1      Subjective Data      Cognition  Overall Cognitive Status: Appears within functional limits for tasks assessed/performed Arousal/Alertness: Awake/alert Orientation Level: Appears intact for tasks assessed Behavior During Session: [facility] for tasks performed    Balance  Balance Balance Assessed: No Dynamic Standing Balance Dynamic Standing - Balance Support: Left upper extremity supported;During functional activity Dynamic Standing - Level of Assistance: 5: Stand by assistance Dynamic Standing - Balance Activities: Reaching across midline;Forward lean/weight shifting (reaching for objects at sink and to open door)  End of Session PT - End of Session Equipment Utilized During Treatment: Gait belt Activity Tolerance: Patient tolerated treatment well Patient left: with call bell/phone within reach;in bed Nurse Communication: Mobility status    Newell Coral 10/09/2011, 1:32 PM  Newell Coral, PTA Acute Rehab [phone] (office)

## 2011-10-09 NOTE — Progress Notes (Signed)
Stroke Team Progress Note  HISTORY FLORNCE [name] is an 78 y.o. female history of vascular problems including AAA S/P repair in 2005, hypertension and a past history of TIA, who noted on Monday 10/05/2011  around 6 pm that she had right eye visual changes. Initially she had bright white dots/lines in her right eye visual field which turned into decreased vision. She is unsure exactly how long this took. Patient called her primary MD who referred her to a ophthalmologist out patient on tuesday, who referred her to a retina specialist who visualized the central retinal artery occlusion. Patient was brought to the ED 10/07/11 due to feeling nauseated and unwell last night. Her PCP wanted her to be admitted to hospital for further evaluation of the Carotid arteries/intracranial vasculature and brain. Patient was not a TPA candidate secondary to delay in arrival. She was admitted for further evaluation and treatment.  SUBJECTIVE No family is at the bedside.  Overall she feels her condition is stable.   OBJECTIVE Most recent Vital Signs: Filed Vitals:   10/08/11 2126 10/09/11 0128 10/09/11 0531 10/09/11 1028  BP: 105/49 141/58 124/73 117/42  Pulse: 56 52 54 74  Temp: 98.5 F (36.9 C) 98.4 F (36.9 C) 98.2 F (36.8 C) 98.1 F (36.7 C)  TempSrc: Oral Oral Oral Oral  Resp: 16 16 16 16   Height:      Weight:      SpO2: 98% 99% 97% 97%   Intake/Output from previous day: 09/05 0701 - 09/06 0700 In: 840 [P.O.:840] Out: 200 [Urine:200]  MEDICATIONS    . amLODipine  5 mg Oral Daily  . atorvastatin  20 mg Oral q1800  . clopidogrel  75 mg Oral Q breakfast  . cycloSPORINE  1 drop Both Eyes BID  . enoxaparin  40 mg Subcutaneous Q24H  . escitalopram  10 mg Oral Daily  . OLANZapine  2.5 mg Oral Daily  . pantoprazole  40 mg Oral Q1200   PRN:  gadobenate dimeglumine, ondansetron (ZOFRAN) IV, senna-docusate  Diet:  Cardiac thin liquids Activity:  OOB  Up with assistance DVT Prophylaxis:  Lovenox 40 mg  sq daily   CLINICALLY SIGNIFICANT STUDIES Basic Metabolic Panel:  Lab 10/07/11 1610 10/06/11 2320  NA -- 145  K -- 3.6  CL -- 107  CO2 -- 28  GLUCOSE -- 110*  BUN -- 12  CREATININE 0.78 0.77  CALCIUM -- 9.9  MG -- --  PHOS -- --   CBC:  Lab 10/07/11 0635 10/06/11 2320  WBC 7.0 10.5  NEUTROABS -- 7.1  HGB 13.0 13.5  HCT 38.8 40.1  MCV 89.8 89.9  PLT 248 263   Coagulation:  Lab 10/07/11 0125  LABPROT 13.4  INR 1.00   Urinalysis:  Lab 10/08/11 1704  COLORURINE YELLOW  LABSPEC 1.012  PHURINE 6.5  GLUCOSEU NEGATIVE  HGBUR NEGATIVE  BILIRUBINUR NEGATIVE  KETONESUR NEGATIVE  PROTEINUR NEGATIVE  UROBILINOGEN 1.0  NITRITE NEGATIVE  LEUKOCYTESUR NEGATIVE   Lipid Panel    Component Value Date/Time   CHOL 129 10/08/2011 0656   TRIG 147 10/08/2011 0656   HDL 32* 10/08/2011 0656   CHOLHDL 4.0 10/08/2011 0656   VLDL 29 10/08/2011 0656   LDLCALC 68 10/08/2011 0656   HgbA1C  Lab Results  Component Value Date   HGBA1C 5.8* 10/08/2011   CT of the brain  10/07/2011   1.  No acute intracranial findings. 2.  Atrophy and chronic small vessel ischemic changes. 3.  Clinical correlation regarding the  right eye visual loss recommended.  The globes are incompletely visualized.    MRI of the brain  10/08/2011  Small areas of acute infarction in the cerebellum bilaterally. Small recent infarction right occipital lobe.  Atrophy and moderate chronic microvascular ischemic changes.    Allison [name]  10/08/2011  No acute abnormality in the orbit or optic nerve.    MRA of the brain   10/08/2011  Moderate stenosis of the distal left vertebral artery.  Right vertebral artery and basilar are patent.  Mild atherosclerotic disease in the cavernous carotid bilaterally.    2D Echocardiogram  EF 55-60% with no source of embolus.   Carotid Doppler  Bilateral: No evidence of hemodynamically significant internal carotid artery stenosis. Vertebral artery flow is antegrade. Left vertebral artery flow is  diminished with absence diastolic component.    CXR  10/07/2011  No acute findings.   EKG    Therapy Recommendations PT - HH; OT - HH;   Physical Exam  Pleasant middle aged lady not in distress.Awake alert. Afebrile. Head is nontraumatic. Neck is supple without bruit. Hearing is normal. Cardiac exam no murmur or gallop. Lungs are clear to auscultation. Distal pulses are well felt.  Neurological Exam : Awake  Alert oriented x 3. Normal speech and language.eye movements full without nystagmus.visual acuity severely limited in the right eye with only seeing movements at 2 feet. Right pupil is sluggishly reactive. Vision is normal in the left eye. Fundi were not visualized. Face symmetric. Tongue midline. Normal strength, tone, reflexes and coordination. Normal sensation. Gait deferred.   ASSESSMENT [name] is a 78 y.o. female presenting with change in vision in her right eye due to retinal artery occlusion. Vision loss felt to be thrombolic  secondary to branch vessel atherosclerosis in the eye, not in the brain, however, MRI revealed  small areas of acute infarction in the cerebellum bilaterally as well as a recent small  Infarct in the right occipital lobe.Infarcts now all felt to be embolic from a cardiac source. No source found in hospitalization. On aspirin 81 mg orally every day prior to admission. Now on clopidogrel 75 mg orally every day for secondary stroke prevention.Recommend TEE and extended tele monitoring to look for further source. Patient with resultant right eye visual loss.  -hypertension -TIA hx  Hospital day # 2  TREATMENT/PLAN -Continue clopidogrel 75 mg orally every day for secondary stroke prevention. -TEE to look for embolic source. Please arrange with pts cardiologist or cardiologist of choice. Will need to be NPO after midnight. If positive for PFO (patent foramen ovale), check bilateral lower extremity venous dopplers to rule out DVT as possible source of  stroke. Ok to be done as an OP next week -Please schedule outpatient telemetry monitoring to assess patient for atrial fibrillation as source of stroke. May be arranged with patient's cardiologist, or cardiologist of choice.  -ok for discharged from neuro standpoint -followup with Dr. Pearlean Brownie in 2 mo    Annie Main, MSN, RN, ANVP-BC, ANP-BC, GNP-BC [facility] Pager: [phone] 10/09/2011 11:29 AM  Scribe for Dr. Delia Heady, Stroke Center Medical Director, who has personally reviewed chart, pertinent data, examined the patient and developed the plan of care. Pager:  [phone]

## 2011-10-09 NOTE — Progress Notes (Signed)
Patient d/c with condition stable,assessment remained unchanged prior to discharge.

## 2011-10-09 NOTE — Discharge Summary (Signed)
Allison [name] MRN: [id number] DOB/AGE: [birth year] 76 y.o.  Admit date: 10/07/2011 Discharge date: 10/09/2011  Primary Care Physician:  Londell Moh, MD   Discharge Diagnoses:   Patient Active Problem List  Diagnosis  . HTN (hypertension)  . Embolic stroke  . acute Blindness  right eye  . Hyperlipemia  . Depression  . PUD (peptic ulcer disease)  . Vasculopathy  . Tobacco abuse  . Internal hemorrhoids  . Cholelithiasis  . Retinal artery branch occlusion of right eye  . Dysuria    DISCHARGE MEDICATION: Medication List  As of 10/09/2011  6:08 PM   STOP taking these medications         CENTRUM SILVER PO      olmesartan 40 MG tablet         TAKE these medications         amLODipine 5 MG tablet   Commonly known as: NORVASC   Take 5 mg by mouth daily.      calcium-vitamin D 500-200 MG-UNIT per tablet   Commonly known as: OSCAL WITH D   Take 1 tablet by mouth daily.      clopidogrel 75 MG tablet   Commonly known as: PLAVIX   Take 1 tablet (75 mg total) by mouth daily with breakfast.      co-enzyme Q-10 30 MG capsule   Take 30 mg by mouth daily.      cycloSPORINE 0.05 % ophthalmic emulsion   Commonly known as: RESTASIS   Place 1 drop into both eyes 2 (two) times daily.      escitalopram 20 MG tablet   Commonly known as: LEXAPRO   Take 20 mg by mouth daily.      OLANZapine 2.5 MG tablet   Commonly known as: ZYPREXA   Take 2.5 mg by mouth daily.      pantoprazole 40 MG tablet   Commonly known as: PROTONIX   Take 40 mg by mouth daily.      rosuvastatin 10 MG tablet   Commonly known as: CRESTOR   Take 10 mg by mouth daily.      SYSTANE FREE OP   Place 1 drop into both eyes daily as needed. For dry eyes              Consults: Treatment Team:  Md Stroke, MD   SIGNIFICANT DIAGNOSTIC STUDIES:  Dg Chest 2 View  10/07/2011  *RADIOLOGY REPORT*  Clinical Data: Stroke.  Smoker.  CHEST - 2 VIEW  Comparison: Seven of 20 09/13.  Findings: Trachea is  midline.  Heart size stable.  Thoracic aorta is calcified and somewhat tortuous.  Lungs are mildly hyperinflated but clear.  No pleural fluid.  IMPRESSION: No acute findings.   Original Report Authenticated By: Reyes Ivan, M.D.    Ct Head Wo Contrast  10/07/2011  *RADIOLOGY REPORT*  Clinical Data: 78 year old with right eye vision loss and headache.  CT HEAD WITHOUT CONTRAST  Technique:  Contiguous axial images were obtained from the base of the skull through the vertex without contrast.  Comparison: None.  Findings: There is no evidence of acute intracranial hemorrhage, mass effect, brain edema or extra-axial fluid collection.  The ventricles and subarachnoid spaces are diffusely prominent consistent with mild atrophy. Diffuse periventricular low density is most compatible with moderate chronic small vessel ischemic change.   No acute cortical infarction is seen.  The visualized paranasal sinuses are clear. The calvarium is intact. The orbits are incompletely imaged.  The right lens  is not visualized, but the globe is incompletely seen.  There is no evidence of globe injury.  IMPRESSION:  1.  No acute intracranial findings. 2.  Atrophy and chronic small vessel ischemic changes. 3.  Clinical correlation regarding the right eye visual loss recommended.  The globes are incompletely visualized.   Original Report Authenticated By: Gerrianne Scale, M.D.    Mr Laqueta Jean Wo Contrast  10/08/2011  *RADIOLOGY REPORT*  Clinical Data:  Monocular blindness right.  Rule out retinal artery occlusion.  MRI HEAD AND ORBITS WITHOUT AND WITH CONTRAST  Technique:  Multiplanar, multiecho pulse sequences of the brain and surrounding structures were obtained without and with intravenous contrast. Multiplanar, multiecho pulse sequences of the orbits and surrounding structures were obtained including fat saturation techniques, before and after intravenous contrast administration.  Contrast: 13ml IV Magnevist  Comparison:  CT head  10/07/2011  MRI HEAD  Findings:  Small areas of acute infarct involving the right mid cerebellum and left superior cerebellum.  Small area of recent infarction right occipital lobe.  Generalized atrophy is present.  Moderate chronic microvascular ischemic changes are present throughout the cerebral white matter and pons.  Mild chronic ischemia in the basal ganglia and cerebellum.  Negative for hemorrhage or mass.  Postcontrast imaging reveals normal enhancement.  IMPRESSION: Small areas of acute infarction in the cerebellum bilaterally. Small recent infarction right occipital lobe.  Atrophy and moderate chronic microvascular ischemic changes.  MRI ORBITS  Findings: Normal appearing globe bilaterally with normal enhancement of the retinal artery.  Optic nerve is normal in size and signal.  Extraocular muscles are normal in size.  Orbital fat is normal.  No mass is seen in the orbit.  Optic chiasm is normal. Normal enhancement of the orbit.  IMPRESSION: No acute abnormality in the orbit or optic nerve.  *RADIOLOGY REPORT*  Clinical Data:  Monocular blindness on the right.  Rule out retinal artery occlusion.  MRA HEAD WITHOUT CONTRAST  Technique:  Angiographic images of the Circle of Willis were obtained using MRA technique without intravenous contrast.  Comparison:   None.  Findings:  Dominant right vertebral artery is widely patent to the basilar.  Right PICA is patent.   Small left vertebral artery is diseased distally with a moderate stenosis.  Left PICA is patent.  Basilar is patent without significant stenosis.  Hypoplastic right P1 segment with fetal origin of the right posterior cerebral artery.  Both posterior cerebral arteries are patent.  Superior cerebellar arteries are patent bilaterally.  Atherosclerotic irregularity in the cavernous carotid bilaterally without significant stenosis.  Anterior and middle cerebral arteries are patent bilaterally without significant stenosis. Negative for cerebral aneurysm.   IMPRESSION: Moderate stenosis of the distal left vertebral artery.  Right vertebral artery and basilar are patent.  Mild atherosclerotic disease in the cavernous carotid bilaterally.   Original Report Authenticated By: Camelia Phenes, M.D.    Mr Mra Head/brain Wo Cm  10/08/2011  *RADIOLOGY REPORT*  Clinical Data:  Monocular blindness right.  Rule out retinal artery occlusion.  MRI HEAD AND ORBITS WITHOUT AND WITH CONTRAST  Technique:  Multiplanar, multiecho pulse sequences of the brain and surrounding structures were obtained without and with intravenous contrast. Multiplanar, multiecho pulse sequences of the orbits and surrounding structures were obtained including fat saturation techniques, before and after intravenous contrast administration.  Contrast: 13ml IV Magnevist  Comparison:  CT head 10/07/2011  MRI HEAD  Findings:  Small areas of acute infarct involving the right mid cerebellum and  left superior cerebellum.  Small area of recent infarction right occipital lobe.  Generalized atrophy is present.  Moderate chronic microvascular ischemic changes are present throughout the cerebral white matter and pons.  Mild chronic ischemia in the basal ganglia and cerebellum.  Negative for hemorrhage or mass.  Postcontrast imaging reveals normal enhancement.  IMPRESSION: Small areas of acute infarction in the cerebellum bilaterally. Small recent infarction right occipital lobe.  Atrophy and moderate chronic microvascular ischemic changes.  MRI ORBITS  Findings: Normal appearing globe bilaterally with normal enhancement of the retinal artery.  Optic nerve is normal in size and signal.  Extraocular muscles are normal in size.  Orbital fat is normal.  No mass is seen in the orbit.  Optic chiasm is normal. Normal enhancement of the orbit.  IMPRESSION: No acute abnormality in the orbit or optic nerve.  *RADIOLOGY REPORT*  Clinical Data:  Monocular blindness on the right.  Rule out retinal artery occlusion.  MRA HEAD WITHOUT  CONTRAST  Technique:  Angiographic images of the Circle of Willis were obtained using MRA technique without intravenous contrast.  Comparison:   None.  Findings:  Dominant right vertebral artery is widely patent to the basilar.  Right PICA is patent.   Small left vertebral artery is diseased distally with a moderate stenosis.  Left PICA is patent.  Basilar is patent without significant stenosis.  Hypoplastic right P1 segment with fetal origin of the right posterior cerebral artery.  Both posterior cerebral arteries are patent.  Superior cerebellar arteries are patent bilaterally.  Atherosclerotic irregularity in the cavernous carotid bilaterally without significant stenosis.  Anterior and middle cerebral arteries are patent bilaterally without significant stenosis. Negative for cerebral aneurysm.  IMPRESSION: Moderate stenosis of the distal left vertebral artery.  Right vertebral artery and basilar are patent.  Mild atherosclerotic disease in the cavernous carotid bilaterally.   Original Report Authenticated By: Camelia Phenes, M.D.    Mr Orbits Wo/w Cm  10/08/2011  *RADIOLOGY REPORT*  Clinical Data:  Monocular blindness right.  Rule out retinal artery occlusion.  MRI HEAD AND ORBITS WITHOUT AND WITH CONTRAST  Technique:  Multiplanar, multiecho pulse sequences of the brain and surrounding structures were obtained without and with intravenous contrast. Multiplanar, multiecho pulse sequences of the orbits and surrounding structures were obtained including fat saturation techniques, before and after intravenous contrast administration.  Contrast: 13ml IV Magnevist  Comparison:  CT head 10/07/2011  MRI HEAD  Findings:  Small areas of acute infarct involving the right mid cerebellum and left superior cerebellum.  Small area of recent infarction right occipital lobe.  Generalized atrophy is present.  Moderate chronic microvascular ischemic changes are present throughout the cerebral white matter and pons.  Mild chronic  ischemia in the basal ganglia and cerebellum.  Negative for hemorrhage or mass.  Postcontrast imaging reveals normal enhancement.  IMPRESSION: Small areas of acute infarction in the cerebellum bilaterally. Small recent infarction right occipital lobe.  Atrophy and moderate chronic microvascular ischemic changes.  MRI ORBITS  Findings: Normal appearing globe bilaterally with normal enhancement of the retinal artery.  Optic nerve is normal in size and signal.  Extraocular muscles are normal in size.  Orbital fat is normal.  No mass is seen in the orbit.  Optic chiasm is normal. Normal enhancement of the orbit.  IMPRESSION: No acute abnormality in the orbit or optic nerve.  *RADIOLOGY REPORT*  Clinical Data:  Monocular blindness on the right.  Rule out retinal artery occlusion.  MRA HEAD WITHOUT CONTRAST  Technique:  Angiographic images of the Circle of Willis were obtained using MRA technique without intravenous contrast.  Comparison:   None.  Findings:  Dominant right vertebral artery is widely patent to the basilar.  Right PICA is patent.   Small left vertebral artery is diseased distally with a moderate stenosis.  Left PICA is patent.  Basilar is patent without significant stenosis.  Hypoplastic right P1 segment with fetal origin of the right posterior cerebral artery.  Both posterior cerebral arteries are patent.  Superior cerebellar arteries are patent bilaterally.  Atherosclerotic irregularity in the cavernous carotid bilaterally without significant stenosis.  Anterior and middle cerebral arteries are patent bilaterally without significant stenosis. Negative for cerebral aneurysm.  IMPRESSION: Moderate stenosis of the distal left vertebral artery.  Right vertebral artery and basilar are patent.  Mild atherosclerotic disease in the cavernous carotid bilaterally.   Original Report Authenticated By: Camelia Phenes, M.D.      ECHO:   - Left ventricle: Wall thickness was increased in a pattern of moderate LVH.  Systolic function was normal. The estimated ejection fraction was in the range of 55% to 60%. - Tricuspid valve: Moderate regurgitation. - Pulmonary arteries: PA peak pressure   CAROTID DOPPLER: Technically limited due to high bifurcations .  No significant extracranial carotid artery stenosis demonstrated. Vertebrals are patent with antegrade flow. The left vertebral waveform is atypical with a loss of diastolic component. suggestive of proximal stenosis likely.    BRIEF ADMITTING H & P: Allison [name] is an 78 year old female who is usually dependent community level until several days prior to her hospitalization and she noticed blindness in the right eye. The patient went to see her ophthalmologist who referred her to retinal specialist Dr. Allyne Gee. I spoke with Dr. Allyne Gee who stated that the patient had retinal artery occlusion. The patient stenosis she was feeling weak and dizzy and thus came to the emergency room and she was admitted for workup of CVA. Please refer to the H&P for details of the history of present illness.    Hospital Course:  Present on Admission:  .Embolic stroke: Patient presented to the emergency room with complaints of dizziness and right eye blindness. MRI showed small areas of acute infarction in the cerebellum bilaterally and a small recent infection in the right occipital lobe. This likely indicates a shower effect of emboli and the patient would benefit from a TEE and outpatient evaluation for atrial fibrillation with outpatient telemetry monitoring. She was evaluated by neurology who felt as if the patient to be discharged and how followup he is an outpatient she would not be able to obtainThe TEE over the weekend and so far telemetry monitoring has not demonstrated atrial fibrillation. The patient was started on Plavix and she is to continue Plavix 75 mg daily for secondary stroke prevention.   the patient had testing for risk stratification performed which  shows a hemoglobin A1c of 5.8 which indicates a prediabetic state. She also had cholesterol panel done that showed an LDL of 68 there is no indication for her increased cholesterol management. Thus the patient is continued on her Crestor 10 mg by mouth daily. Her patient to followup with her primary care physician Dr. Renne Crigler to arrange for outpatient telemetry monitoring. I've contacted [facility] to arrange for outpatient TEE. Cardiology was will contact her and make that arrangement for TEE.   Marland KitchenHTN (hypertension): Blood pressures are actually low in the hospital. Her Benicar was discontinued the patient is continued on Norvasc 5  mg.   .Acute Blindness right eye: Likely secondary to the CVA in the occipital lobe.  Marland KitchenHyperlipemia: Cholesterol well controlled will continue on current dose of Crestor.  .Tobacco abuse: Patient counseled against further tobacco use.  .Vasculopathy  .Retinal artery branch occlusion of right eye: Patient had MRI of the head and orbits which did not show any acute abnormality the orbit or optic nerve. I will defer back to retinal specialist Dr. Allyne Gee.   Condition at the time of discharge: Good.  Disposition and Follow-up: Patient to follow up with primary care physician Dr. Zollie Beckers far in one week. Specifically for outpatient telemetry monitoring. Cardiology will call the patient to the Center for the outpatient TEE and the patient is also followup with Dr. Pearlean Brownie for post hospital visit. Patient is being discharged with home health services.  Discharge Orders    Future Orders Please Complete By Expires   Diet - low sodium heart healthy      Home Health      Questions: Responses:   To provide the following care/treatments PT    OT   Face-to-face encounter      Comments:   I Fatima Fedie A. certify that this patient is under my care and that I, or a nurse practitioner or physician's assistant working with me, had a face-to-face encounter that meets the  physician face-to-face encounter requirements with this patient on 10/09/2011.   Questions: Responses:   The encounter with the patient was in whole, or in part, for the following medical condition, which is the primary reason for home health care Acute CVA   I certify that, based on my findings, the following services are medically necessary home health services Physical therapy   My clinical findings support the need for the above services Patients with Cognitive/Physical impairments requiring complicated treatment plans   Further, I certify that my clinical findings support that this patient is homebound due to: Unable to leave home safely without assistance   To provide the following care/treatments PT    OT   Walker          DISCHARGE EXAM:  General: Alert, awake, oriented x3, in no acute distress.  Vital Signs:Blood pressure 117/42, pulse 74, temperature 98.1 F (36.7 C), temperature source Oral, resp. rate 16, height 5\' 1"  (1.549 m), weight 66.7 kg (147 lb 0.8 oz), SpO2 97.00%. HEENT: [place]/AT PEERL, EOMI.  Heart: Regular rate and rhythm, without murmurs, rubs, gallops, PMI non-displaced, no heaves or thrills on palpation.  Lungs: Clear to auscultation, no wheezing or rhonchi noted. No increased vocal fremitus resonant to percussion  Abdomen: Soft, nontender, nondistended, positive bowel sounds, no masses no hepatosplenomegaly noted..  Neuro: Pt has focal blindness of right eye but is able to see the shadows of her fingers if held close Cranial nerves IV through XII grossly intact. DTRs 2+ bilaterally upper and lower extremities. Strength functional in bilateral upper and lower extremities.     Basename 10/07/11 0635 10/06/11 2320  NA -- 145  K -- 3.6  CL -- 107  CO2 -- 28  GLUCOSE -- 110*  BUN -- 12  CREATININE 0.78 0.77  CALCIUM -- 9.9  MG -- --  PHOS -- --   No results found for this basename: AST:2,ALT:2,ALKPHOS:2,BILITOT:2,PROT:2,ALBUMIN:2 in the last 72 hours No results  found for this basename: LIPASE:2,AMYLASE:2 in the last 72 hours  Basename 10/07/11 0635 10/06/11 2320  WBC 7.0 10.5  NEUTROABS -- 7.1  HGB 13.0 13.5  HCT 38.8 40.1  MCV 89.8 89.9  PLT 248 263   Total time for discharge including decision-making a face-to-face time approximately 40 minutes Signed: Velencia Lenart A. 10/09/2011, 6:08 PM

## 2011-10-13 ENCOUNTER — Other Ambulatory Visit: Payer: Medicare Other

## 2011-10-13 ENCOUNTER — Ambulatory Visit: Payer: Medicare Other | Admitting: Vascular Surgery

## 2011-10-13 DIAGNOSIS — H35379 Puckering of macula, unspecified eye: Secondary | ICD-10-CM | POA: Diagnosis not present

## 2011-10-13 DIAGNOSIS — H43819 Vitreous degeneration, unspecified eye: Secondary | ICD-10-CM | POA: Diagnosis not present

## 2011-10-13 DIAGNOSIS — H3410 Central retinal artery occlusion, unspecified eye: Secondary | ICD-10-CM | POA: Diagnosis not present

## 2011-10-14 DIAGNOSIS — E7800 Pure hypercholesterolemia, unspecified: Secondary | ICD-10-CM | POA: Diagnosis not present

## 2011-10-14 DIAGNOSIS — Z79899 Other long term (current) drug therapy: Secondary | ICD-10-CM | POA: Diagnosis not present

## 2011-10-16 DIAGNOSIS — I10 Essential (primary) hypertension: Secondary | ICD-10-CM | POA: Diagnosis not present

## 2011-10-16 DIAGNOSIS — E7800 Pure hypercholesterolemia, unspecified: Secondary | ICD-10-CM | POA: Diagnosis not present

## 2011-10-16 DIAGNOSIS — Z23 Encounter for immunization: Secondary | ICD-10-CM | POA: Diagnosis not present

## 2011-10-16 DIAGNOSIS — F339 Major depressive disorder, recurrent, unspecified: Secondary | ICD-10-CM | POA: Diagnosis not present

## 2011-10-19 DIAGNOSIS — Z5189 Encounter for other specified aftercare: Secondary | ICD-10-CM | POA: Diagnosis not present

## 2011-10-19 DIAGNOSIS — Z8673 Personal history of transient ischemic attack (TIA), and cerebral infarction without residual deficits: Secondary | ICD-10-CM | POA: Diagnosis not present

## 2011-10-19 DIAGNOSIS — F329 Major depressive disorder, single episode, unspecified: Secondary | ICD-10-CM | POA: Diagnosis not present

## 2011-10-19 DIAGNOSIS — I10 Essential (primary) hypertension: Secondary | ICD-10-CM | POA: Diagnosis not present

## 2011-10-21 DIAGNOSIS — I10 Essential (primary) hypertension: Secondary | ICD-10-CM | POA: Diagnosis not present

## 2011-10-21 DIAGNOSIS — F329 Major depressive disorder, single episode, unspecified: Secondary | ICD-10-CM | POA: Diagnosis not present

## 2011-10-21 DIAGNOSIS — Z8673 Personal history of transient ischemic attack (TIA), and cerebral infarction without residual deficits: Secondary | ICD-10-CM | POA: Diagnosis not present

## 2011-10-21 DIAGNOSIS — Z5189 Encounter for other specified aftercare: Secondary | ICD-10-CM | POA: Diagnosis not present

## 2011-10-27 DIAGNOSIS — Z5189 Encounter for other specified aftercare: Secondary | ICD-10-CM | POA: Diagnosis not present

## 2011-10-27 DIAGNOSIS — I10 Essential (primary) hypertension: Secondary | ICD-10-CM | POA: Diagnosis not present

## 2011-10-27 DIAGNOSIS — F329 Major depressive disorder, single episode, unspecified: Secondary | ICD-10-CM | POA: Diagnosis not present

## 2011-10-27 DIAGNOSIS — Z8673 Personal history of transient ischemic attack (TIA), and cerebral infarction without residual deficits: Secondary | ICD-10-CM | POA: Diagnosis not present

## 2011-10-28 ENCOUNTER — Encounter: Payer: Self-pay | Admitting: Cardiology

## 2011-10-28 ENCOUNTER — Ambulatory Visit (INDEPENDENT_AMBULATORY_CARE_PROVIDER_SITE_OTHER): Payer: Medicare Other | Admitting: Cardiology

## 2011-10-28 VITALS — BP 145/65 | HR 68 | Ht 61.0 in | Wt 133.0 lb

## 2011-10-28 DIAGNOSIS — I4891 Unspecified atrial fibrillation: Secondary | ICD-10-CM | POA: Diagnosis not present

## 2011-10-28 NOTE — Patient Instructions (Addendum)
The current medical regimen is effective;  continue present plan and medications.  Your physician has recommended that you wear an event monitor. Event monitors are medical devices that record the heart's electrical activity. Doctors most often [place] these monitors to diagnose arrhythmias. Arrhythmias are problems with the speed or rhythm of the heartbeat. The monitor is a small, portable device. You can wear one while you do your normal daily activities. This is usually used to diagnose what is causing palpitations/syncope (passing out).  Your physician has requested that you have a TEE. During a TEE, sound waves are used to create images of your heart. It provides your doctor with information about the size and shape of your heart and how well your heart's chambers and valves are working. In this test, a transducer is attached to the end of a flexible tube that's guided down your throat and into your esophagus (the tube leading from you mouth to your stomach) to get a more detailed image of your heart. You are not awake for the procedure. Please see the instruction sheet given to you today. For further information please visit [email or web address]   Follow up after testing has been completed.

## 2011-10-28 NOTE — Progress Notes (Signed)
HPI The patient presents for evaluation after a stroke secondary to emboli to the right retinal artery. She was hospitalized for this. Her work up included carotid Doppler which demonstrated possible proximal stenosis.  However, I don't see where this was quantified. She did have an echocardiogram which demonstrated LVH with an EF of 60%. Moderate tricuspid regurgitation. She did have a CT angiogram which demonstrated moderate distal vertebral artery stenosis. There was no arrhythmia noted.  The patient is referred now to consider possible TEE and evaluation for atrial fibrillation. She has had no history of atrial fibrillation and has no symptoms consistent with this. She doesn't notice any palpitations, presyncope or syncope. She has had no PND or orthopnea. She was very active prior to her stroke. Her balance is off now and she is using a walker. She denies any chest pressure, neck or arm discomfort. She denies any shortness of breath, PND or orthopnea. She's had no weight gain or edema.  Allergies  Allergen Reactions  . Shellfish Allergy Shortness Of Breath and Rash    All seafood  . Ampicillin   . Biaxin (Clarithromycin)   . Celebrex (Celecoxib)   . Cozaar (Losartan)     High blood pressure  . Demerol (Meperidine)     unknown  . Elavil (Amitriptyline)   . Fosamax (Alendronic Acid)   . Hydromorphone     Hives   . Inderal (Propranolol)   . Ivp Dye (Iodinated Diagnostic Agents)     rash  . Levaquin (Levofloxacin)   . Macrodantin (Nitrofurantoin)   . Mycinette (Phenol)   . Neggram (Nalidixic Acid)   . Novocain (Procaine)   . Pamelor (Nortriptyline)   . Paxil (Paroxetine)   . Penicillins     rash  . Robaxin (Methocarbamol)   . Tamiflu (Oseltamivir)   . Tenormin (Atenolol)   . Triavil (Perphenazine-Amitriptyline)   . Wellbutrin (Bupropion)   . Xylocaine (Lidocaine)   . Zoloft (Sertraline)     Current Outpatient Prescriptions  Medication Sig Dispense Refill  . amLODipine  (NORVASC) 5 MG tablet Take 5 mg by mouth daily.      . calcium-vitamin D (OSCAL WITH D) 500-200 MG-UNIT per tablet Take 1 tablet by mouth daily.      . clopidogrel (PLAVIX) 75 MG tablet Take 1 tablet (75 mg total) by mouth daily with breakfast.  30 tablet  0  . co-enzyme Q-10 30 MG capsule Take 30 mg by mouth daily.      . cycloSPORINE (RESTASIS) 0.05 % ophthalmic emulsion Place 1 drop into both eyes 2 (two) times daily.      Marland Kitchen escitalopram (LEXAPRO) 20 MG tablet Take 20 mg by mouth daily.      Marland Kitchen ezetimibe (ZETIA) 10 MG tablet Take 10 mg by mouth daily.      Marland Kitchen OLANZapine (ZYPREXA) 2.5 MG tablet Take 2.5 mg by mouth daily.      . pantoprazole (PROTONIX) 40 MG tablet Take 40 mg by mouth daily.      Bertram Gala Glycol-Propyl Glycol (SYSTANE FREE OP) Place 1 drop into both eyes daily as needed. For dry eyes      . rosuvastatin (CRESTOR) 10 MG tablet Take 10 mg by mouth daily.        Past Medical History  Diagnosis Date  . HTN (hypertension)   . Aortic aneurysm   . Transient ischemic attack   . Murmur, cardiac   . Cholelithiasis   . Internal hemorrhoids   . Irritable bowel syndrome   .  Depression   . Eustachian tube dysfunction   . Appendicitis     Past Surgical History  Procedure Date  . Abdominal aortic aneurysm repair 2005    Family History  Problem Relation Age of Onset  . Asthma Brother   . Diabetes Mother   . Coronary artery disease Mother     died age 78  . Coronary artery disease Father     died age 70  . Coronary artery disease Brother   . Coronary artery disease Sister     History   Social History  . Marital Status: Married    Spouse Name: N/A    Number of Children: N/A  . Years of Education: N/A   Occupational History  . Not on file.   Social History Main Topics  . Smoking status: Current Every Day Smoker -- 0.5 packs/day for 40 years    Types: Cigarettes  . Smokeless tobacco: Not on file  . Alcohol Use: No  . Drug Use: No  . Sexually Active: Not on  file   Other Topics Concern  . Not on file   Social History Narrative  . No narrative on file    ROS:  Positive for joint pains. Otherwise as stated in the history of present illness and negative for all other systems.  PHYSICAL EXAM BP 145/65  Pulse 68  Ht 5\' 1"  (1.549 m)  Wt 133 lb (60.328 kg)  BMI 25.13 kg/m2 GENERAL:  Well appearing HEENT:  Pupils equal round and reactive, fundi not visualized, oral mucosa unremarkable, dentures NECK:  No jugular venous distention, waveform within normal limits, carotid upstroke brisk and symmetric, positive bilateral bruits, no thyromegaly LYMPHATICS:  No cervical, inguinal adenopathy LUNGS:  Clear to auscultation bilaterally BACK:  No CVA tenderness CHEST:  Unremarkable HEART:  PMI not displaced or sustained,S1 and S2 within normal limits, no S3, no S4, no clicks, no rubs, 3/6 holosystolic apical and left sternal border murmur ABD:  Flat, positive bowel sounds normal in frequency in pitch, positive bruits, no rebound, no guarding, no midline pulsatile mass, no hepatomegaly, no splenomegaly EXT:  2 plus pulses throughout, no edema, no cyanosis no clubbing SKIN:  No rashes no nodules NEURO:  Cranial nerves II through XII grossly intact, motor grossly intact throughout PSYCH:  Cognitively intact, oriented to person place and time  EKG:  Sinus rhythm, rate 59, axis within normal limits, intervals within normal limits, in no acute ST-T wave changes.  10/07/11  ASSESSMENT AND PLAN  Embolic CVA - The patient will be set up for TEE. I will also have her wear a 21 day monitor. Further evaluation will be based on this.  PVD - The patient has evidence of diffuse vascular disease. We will concentrate on aggressive risk reduction.  Tobacco abuse - We discussed this. She doesn't think she would at this time. We discussed a specific strategy for tobacco cessation.  (Greater than three minutes discussing tobacco cessation.)  Rsk reduction - At some  point in time given her significant risk factors I would like her to be screened with stress testing. However, I will defer this until she's had further recovery from her recent stroke. Marland Kitchen

## 2011-10-29 DIAGNOSIS — Z8673 Personal history of transient ischemic attack (TIA), and cerebral infarction without residual deficits: Secondary | ICD-10-CM | POA: Diagnosis not present

## 2011-10-29 DIAGNOSIS — F329 Major depressive disorder, single episode, unspecified: Secondary | ICD-10-CM | POA: Diagnosis not present

## 2011-10-29 DIAGNOSIS — I10 Essential (primary) hypertension: Secondary | ICD-10-CM | POA: Diagnosis not present

## 2011-10-29 DIAGNOSIS — Z5189 Encounter for other specified aftercare: Secondary | ICD-10-CM | POA: Diagnosis not present

## 2011-11-03 DIAGNOSIS — I10 Essential (primary) hypertension: Secondary | ICD-10-CM | POA: Diagnosis not present

## 2011-11-03 DIAGNOSIS — F329 Major depressive disorder, single episode, unspecified: Secondary | ICD-10-CM | POA: Diagnosis not present

## 2011-11-03 DIAGNOSIS — Z8673 Personal history of transient ischemic attack (TIA), and cerebral infarction without residual deficits: Secondary | ICD-10-CM | POA: Diagnosis not present

## 2011-11-03 DIAGNOSIS — Z5189 Encounter for other specified aftercare: Secondary | ICD-10-CM | POA: Diagnosis not present

## 2011-11-05 DIAGNOSIS — F329 Major depressive disorder, single episode, unspecified: Secondary | ICD-10-CM | POA: Diagnosis not present

## 2011-11-05 DIAGNOSIS — I10 Essential (primary) hypertension: Secondary | ICD-10-CM | POA: Diagnosis not present

## 2011-11-05 DIAGNOSIS — Z8673 Personal history of transient ischemic attack (TIA), and cerebral infarction without residual deficits: Secondary | ICD-10-CM | POA: Diagnosis not present

## 2011-11-05 DIAGNOSIS — Z5189 Encounter for other specified aftercare: Secondary | ICD-10-CM | POA: Diagnosis not present

## 2011-11-10 DIAGNOSIS — Z5189 Encounter for other specified aftercare: Secondary | ICD-10-CM | POA: Diagnosis not present

## 2011-11-10 DIAGNOSIS — F329 Major depressive disorder, single episode, unspecified: Secondary | ICD-10-CM | POA: Diagnosis not present

## 2011-11-10 DIAGNOSIS — I10 Essential (primary) hypertension: Secondary | ICD-10-CM | POA: Diagnosis not present

## 2011-11-10 DIAGNOSIS — Z8673 Personal history of transient ischemic attack (TIA), and cerebral infarction without residual deficits: Secondary | ICD-10-CM | POA: Diagnosis not present

## 2011-11-12 DIAGNOSIS — Z8673 Personal history of transient ischemic attack (TIA), and cerebral infarction without residual deficits: Secondary | ICD-10-CM | POA: Diagnosis not present

## 2011-11-12 DIAGNOSIS — F329 Major depressive disorder, single episode, unspecified: Secondary | ICD-10-CM | POA: Diagnosis not present

## 2011-11-12 DIAGNOSIS — I10 Essential (primary) hypertension: Secondary | ICD-10-CM | POA: Diagnosis not present

## 2011-11-12 DIAGNOSIS — Z5189 Encounter for other specified aftercare: Secondary | ICD-10-CM | POA: Diagnosis not present

## 2011-11-17 DIAGNOSIS — F329 Major depressive disorder, single episode, unspecified: Secondary | ICD-10-CM | POA: Diagnosis not present

## 2011-11-17 DIAGNOSIS — H35379 Puckering of macula, unspecified eye: Secondary | ICD-10-CM | POA: Diagnosis not present

## 2011-11-17 DIAGNOSIS — H3410 Central retinal artery occlusion, unspecified eye: Secondary | ICD-10-CM | POA: Diagnosis not present

## 2011-11-17 DIAGNOSIS — Z5189 Encounter for other specified aftercare: Secondary | ICD-10-CM | POA: Diagnosis not present

## 2011-11-17 DIAGNOSIS — H43819 Vitreous degeneration, unspecified eye: Secondary | ICD-10-CM | POA: Diagnosis not present

## 2011-11-17 DIAGNOSIS — H211X9 Other vascular disorders of iris and ciliary body, unspecified eye: Secondary | ICD-10-CM | POA: Diagnosis not present

## 2011-11-17 DIAGNOSIS — I10 Essential (primary) hypertension: Secondary | ICD-10-CM | POA: Diagnosis not present

## 2011-11-17 DIAGNOSIS — Z8673 Personal history of transient ischemic attack (TIA), and cerebral infarction without residual deficits: Secondary | ICD-10-CM | POA: Diagnosis not present

## 2011-11-19 DIAGNOSIS — I10 Essential (primary) hypertension: Secondary | ICD-10-CM | POA: Diagnosis not present

## 2011-11-19 DIAGNOSIS — Z5189 Encounter for other specified aftercare: Secondary | ICD-10-CM | POA: Diagnosis not present

## 2011-11-19 DIAGNOSIS — F329 Major depressive disorder, single episode, unspecified: Secondary | ICD-10-CM | POA: Diagnosis not present

## 2011-11-19 DIAGNOSIS — Z8673 Personal history of transient ischemic attack (TIA), and cerebral infarction without residual deficits: Secondary | ICD-10-CM | POA: Diagnosis not present

## 2011-11-22 DIAGNOSIS — R69 Illness, unspecified: Secondary | ICD-10-CM | POA: Diagnosis not present

## 2011-11-24 DIAGNOSIS — H211X9 Other vascular disorders of iris and ciliary body, unspecified eye: Secondary | ICD-10-CM | POA: Diagnosis not present

## 2011-11-24 DIAGNOSIS — H3410 Central retinal artery occlusion, unspecified eye: Secondary | ICD-10-CM | POA: Diagnosis not present

## 2011-11-25 DIAGNOSIS — Z8673 Personal history of transient ischemic attack (TIA), and cerebral infarction without residual deficits: Secondary | ICD-10-CM | POA: Diagnosis not present

## 2011-11-25 DIAGNOSIS — Z79899 Other long term (current) drug therapy: Secondary | ICD-10-CM | POA: Diagnosis not present

## 2011-11-25 DIAGNOSIS — R29898 Other symptoms and signs involving the musculoskeletal system: Secondary | ICD-10-CM | POA: Diagnosis not present

## 2011-11-25 DIAGNOSIS — E7800 Pure hypercholesterolemia, unspecified: Secondary | ICD-10-CM | POA: Diagnosis not present

## 2011-11-25 DIAGNOSIS — K219 Gastro-esophageal reflux disease without esophagitis: Secondary | ICD-10-CM | POA: Diagnosis not present

## 2011-11-26 ENCOUNTER — Telehealth: Payer: Self-pay | Admitting: *Deleted

## 2011-11-26 NOTE — Telephone Encounter (Signed)
Left message for pt to call back to schedule TEE.  Could possibly schedule on Tuesday 10/29 with Dr Jens Som or 10/31 with Dr Shirlee Latch

## 2011-12-01 NOTE — Telephone Encounter (Signed)
Have been attempting to contact pt to schedule for a TEE - reached her today and she states she has the flu and will call back to schedule

## 2011-12-03 DIAGNOSIS — R21 Rash and other nonspecific skin eruption: Secondary | ICD-10-CM | POA: Diagnosis not present

## 2011-12-08 DIAGNOSIS — H35729 Serous detachment of retinal pigment epithelium, unspecified eye: Secondary | ICD-10-CM | POA: Diagnosis not present

## 2011-12-09 DIAGNOSIS — E7800 Pure hypercholesterolemia, unspecified: Secondary | ICD-10-CM | POA: Diagnosis not present

## 2011-12-09 DIAGNOSIS — L259 Unspecified contact dermatitis, unspecified cause: Secondary | ICD-10-CM | POA: Diagnosis not present

## 2011-12-09 DIAGNOSIS — IMO0001 Reserved for inherently not codable concepts without codable children: Secondary | ICD-10-CM | POA: Diagnosis not present

## 2011-12-14 ENCOUNTER — Encounter: Payer: Self-pay | Admitting: *Deleted

## 2011-12-14 ENCOUNTER — Telehealth: Payer: Self-pay | Admitting: Cardiology

## 2011-12-14 NOTE — Telephone Encounter (Signed)
Pt states is doesn't matter to her what day she is scheduled for.  Aware I will call her back with day, time and instructions.

## 2011-12-14 NOTE — Telephone Encounter (Signed)
New Problem:    Patient called in wanting to reschedule her TEE. Please call back.

## 2011-12-14 NOTE — Telephone Encounter (Signed)
Pt is scheduled and is aware of instructions, date and time.

## 2011-12-18 ENCOUNTER — Ambulatory Visit (HOSPITAL_COMMUNITY)
Admission: RE | Admit: 2011-12-18 | Discharge: 2011-12-18 | Disposition: A | Discharge status: Home / Self Care | Payer: Medicare Other | Source: Ambulatory Visit | Attending: Internal Medicine | Admitting: Internal Medicine

## 2011-12-18 ENCOUNTER — Encounter (HOSPITAL_COMMUNITY)
Admission: RE | Disposition: A | Discharge status: Home / Self Care | Payer: Self-pay | Source: Ambulatory Visit | Attending: Internal Medicine

## 2011-12-18 ENCOUNTER — Encounter (HOSPITAL_COMMUNITY): Payer: Self-pay

## 2011-12-18 DIAGNOSIS — I6789 Other cerebrovascular disease: Secondary | ICD-10-CM

## 2011-12-18 DIAGNOSIS — E785 Hyperlipidemia, unspecified: Secondary | ICD-10-CM | POA: Diagnosis not present

## 2011-12-18 DIAGNOSIS — I10 Essential (primary) hypertension: Secondary | ICD-10-CM | POA: Insufficient documentation

## 2011-12-18 DIAGNOSIS — F17200 Nicotine dependence, unspecified, uncomplicated: Secondary | ICD-10-CM | POA: Diagnosis not present

## 2011-12-18 HISTORY — PX: TEE WITHOUT CARDIOVERSION: SHX5443

## 2011-12-18 SURGERY — ECHOCARDIOGRAM, TRANSESOPHAGEAL
Anesthesia: Moderate Sedation

## 2011-12-18 MED ORDER — BUTAMBEN-TETRACAINE-BENZOCAINE 2-2-14 % EX AERO
INHALATION_SPRAY | CUTANEOUS | Status: DC | PRN
  Administered 2011-12-18: 1 via TOPICAL

## 2011-12-18 MED ORDER — DIPHENHYDRAMINE HCL 50 MG/ML IJ SOLN
INTRAMUSCULAR | Status: AC
  Filled 2011-12-18: qty 1

## 2011-12-18 MED ORDER — FENTANYL CITRATE 0.05 MG/ML IJ SOLN
INTRAMUSCULAR | Status: DC | PRN
  Administered 2011-12-18: 25 ug via INTRAVENOUS

## 2011-12-18 MED ORDER — MIDAZOLAM HCL 10 MG/2ML IJ SOLN
INTRAMUSCULAR | Status: DC | PRN
  Administered 2011-12-18: 1 mg via INTRAVENOUS

## 2011-12-18 MED ORDER — MIDAZOLAM HCL 5 MG/ML IJ SOLN
INTRAMUSCULAR | Status: AC
  Filled 2011-12-18: qty 2

## 2011-12-18 MED ORDER — FENTANYL CITRATE 0.05 MG/ML IJ SOLN
INTRAMUSCULAR | Status: AC
  Filled 2011-12-18: qty 2

## 2011-12-18 MED ORDER — SODIUM CHLORIDE 0.9 % IV SOLN: INTRAVENOUS | Status: DC

## 2011-12-18 MED ORDER — LIDOCAINE VISCOUS 2 % MT SOLN
OROMUCOSAL | Status: AC
  Filled 2011-12-18: qty 15

## 2011-12-18 NOTE — Progress Notes (Signed)
  Echocardiogram Echocardiogram Transesophageal has been performed.  Brenn Deziel FRANCES 12/18/2011, 10:46 AM

## 2011-12-18 NOTE — H&P (Signed)
   Patient presented today for TEE.  She had a stroke.  She is being worked up for embolic source. Please see Dr. Jenene Slicker note from 9/13 for full details.  No change to history or physical since that time.  She did not have the event monitor to look for atrial fibrillation done yet.   Marca Ancona 12/18/2011 10:16 AM

## 2011-12-20 ENCOUNTER — Emergency Department (HOSPITAL_COMMUNITY)
Admission: EM | Admit: 2011-12-20 | Discharge: 2011-12-21 | Disposition: A | Discharge status: Home / Self Care | Payer: Medicare Other | Attending: Emergency Medicine | Admitting: Emergency Medicine

## 2011-12-20 ENCOUNTER — Encounter (HOSPITAL_COMMUNITY): Payer: Self-pay | Admitting: *Deleted

## 2011-12-20 DIAGNOSIS — E860 Dehydration: Secondary | ICD-10-CM | POA: Diagnosis not present

## 2011-12-20 DIAGNOSIS — H349 Unspecified retinal vascular occlusion: Secondary | ICD-10-CM | POA: Diagnosis not present

## 2011-12-20 DIAGNOSIS — Z79899 Other long term (current) drug therapy: Secondary | ICD-10-CM | POA: Diagnosis not present

## 2011-12-20 DIAGNOSIS — N390 Urinary tract infection, site not specified: Secondary | ICD-10-CM

## 2011-12-20 DIAGNOSIS — R300 Dysuria: Secondary | ICD-10-CM | POA: Insufficient documentation

## 2011-12-20 DIAGNOSIS — F17200 Nicotine dependence, unspecified, uncomplicated: Secondary | ICD-10-CM | POA: Insufficient documentation

## 2011-12-20 DIAGNOSIS — F329 Major depressive disorder, single episode, unspecified: Secondary | ICD-10-CM | POA: Diagnosis not present

## 2011-12-20 DIAGNOSIS — R350 Frequency of micturition: Secondary | ICD-10-CM | POA: Insufficient documentation

## 2011-12-20 DIAGNOSIS — R404 Transient alteration of awareness: Secondary | ICD-10-CM | POA: Diagnosis not present

## 2011-12-20 DIAGNOSIS — I69998 Other sequelae following unspecified cerebrovascular disease: Secondary | ICD-10-CM | POA: Insufficient documentation

## 2011-12-20 DIAGNOSIS — Z8673 Personal history of transient ischemic attack (TIA), and cerebral infarction without residual deficits: Secondary | ICD-10-CM | POA: Insufficient documentation

## 2011-12-20 DIAGNOSIS — I10 Essential (primary) hypertension: Secondary | ICD-10-CM | POA: Insufficient documentation

## 2011-12-20 DIAGNOSIS — Z8679 Personal history of other diseases of the circulatory system: Secondary | ICD-10-CM | POA: Insufficient documentation

## 2011-12-20 DIAGNOSIS — R42 Dizziness and giddiness: Secondary | ICD-10-CM | POA: Diagnosis not present

## 2011-12-20 DIAGNOSIS — F3289 Other specified depressive episodes: Secondary | ICD-10-CM | POA: Insufficient documentation

## 2011-12-20 DIAGNOSIS — Z8719 Personal history of other diseases of the digestive system: Secondary | ICD-10-CM | POA: Insufficient documentation

## 2011-12-20 LAB — CBC WITH DIFFERENTIAL/PLATELET
Basophils Absolute: 0.1 K/uL (ref 0.0–0.1)
Basophils Relative: 1 % (ref 0–1)
Eosinophils Absolute: 0.1 K/uL (ref 0.0–0.7)
Eosinophils Relative: 1 % (ref 0–5)
HCT: 46.7 % — ABNORMAL HIGH (ref 36.0–46.0)
Hemoglobin: 15.6 g/dL — ABNORMAL HIGH (ref 12.0–15.0)
Lymphocytes Relative: 19 % (ref 12–46)
Lymphs Abs: 1.7 10*3/uL (ref 0.7–4.0)
MCH: 29.9 pg (ref 26.0–34.0)
MCHC: 33.4 g/dL (ref 30.0–36.0)
MCV: 89.5 fL (ref 78.0–100.0)
Monocytes Absolute: 0.8 10*3/uL (ref 0.1–1.0)
Monocytes Relative: 9 % (ref 3–12)
Neutro Abs: 6.1 K/uL (ref 1.7–7.7)
Neutrophils Relative %: 70 % (ref 43–77)
Platelets: 269 K/uL (ref 150–400)
RBC: 5.22 MIL/uL — ABNORMAL HIGH (ref 3.87–5.11)
RDW: 12.9 % (ref 11.5–15.5)
WBC: 8.7 10*3/uL (ref 4.0–10.5)

## 2011-12-20 LAB — COMPREHENSIVE METABOLIC PANEL WITH GFR
ALT: 9 U/L (ref 0–35)
Albumin: 4.3 g/dL (ref 3.5–5.2)
Calcium: 10 mg/dL (ref 8.4–10.5)
GFR calc Af Amer: 59 mL/min — ABNORMAL LOW (ref >90)
Glucose, Bld: 140 mg/dL — ABNORMAL HIGH (ref 70–99)
Sodium: 142 meq/L (ref 135–145)
Total Protein: 7.6 g/dL (ref 6.0–8.3)

## 2011-12-20 LAB — URINALYSIS, ROUTINE W REFLEX MICROSCOPIC
Glucose, UA: NEGATIVE mg/dL
Hgb urine dipstick: NEGATIVE
Ketones, ur: NEGATIVE mg/dL
Nitrite: NEGATIVE
Protein, ur: NEGATIVE mg/dL
Specific Gravity, Urine: 1.017 (ref 1.005–1.030)
Urobilinogen, UA: 0.2 mg/dL (ref 0.0–1.0)
pH: 5.5 (ref 5.0–8.0)

## 2011-12-20 LAB — URINE MICROSCOPIC-ADD ON

## 2011-12-20 LAB — COMPREHENSIVE METABOLIC PANEL
AST: 18 U/L (ref 0–37)
Alkaline Phosphatase: 73 U/L (ref 39–117)
BUN: 13 mg/dL (ref 6–23)
CO2: 26 mEq/L (ref 19–32)
Chloride: 102 mEq/L (ref 96–112)
Creatinine, Ser: 1 mg/dL (ref 0.50–1.10)
GFR calc non Af Amer: 51 mL/min — ABNORMAL LOW (ref >90)
Potassium: 3.5 mEq/L (ref 3.5–5.1)
Total Bilirubin: 0.4 mg/dL (ref 0.3–1.2)

## 2011-12-20 MED ORDER — SODIUM CHLORIDE 0.9 % IV BOLUS (SEPSIS)
1000.0000 mL | Freq: Once | INTRAVENOUS | Status: AC
  Administered 2011-12-20: 1000 mL via INTRAVENOUS

## 2011-12-20 MED ORDER — SULFAMETHOXAZOLE-TMP DS 800-160 MG PO TABS
1.0000 | ORAL_TABLET | Freq: Once | ORAL | Status: AC
  Administered 2011-12-20: 1 via ORAL
  Filled 2011-12-20: qty 1

## 2011-12-20 NOTE — ED Provider Notes (Signed)
History     CSN: [id number]  Arrival date & time 12/20/11  1737   First MD Initiated Contact with Patient 12/20/11 1916      Chief Complaint  Patient presents with  . Fatigue  . Dizziness    (Consider location/radiation/quality/duration/timing/severity/associated sxs/prior treatment) HPI Comments: Patient with past medical history of mild hypertension as well as stroke involving a blood clot to her right eye several months ago. She is currently undergoing cardiac evaluation and recently had a TEE 2 days ago and is currently awaiting results. Over the last week she has noticed extreme fatigue as well as drowsiness and wanting to sleep almost all day. She does also have a history of depression for which he takes Zyprexa and Lexapro. These medications or not She's been on the same dosage for a while. She reports her appetite is down and has not been eating very well. Her son notes that she snacks often and usually on cookies and other sugary foods. She also notes some dysuria and urinary frequency with small amounts coming out. She does have remote history of urinary tract infection in the past. She denies any significant pain including headache, chest pain, back pain, abdominal pain. She denies any nausea vomiting or diarrhea. She denies any slurred speech, change in current vision, focal numbness or weakness in her arms or legs.  The history is provided by the patient and a relative.    Past Medical History  Diagnosis Date  . HTN (hypertension)   . Aortic aneurysm   . Transient ischemic attack   . Murmur, cardiac   . Cholelithiasis   . Internal hemorrhoids   . Irritable bowel syndrome   . Depression   . Eustachian tube dysfunction   . Appendicitis   . CVA (cerebral infarction)     Right retinal branch occlusion    Past Surgical History  Procedure Date  . Abdominal aortic aneurysm repair 2005  . Kidney stones removed   . Appendectomy   . Abdominal hysterectomy   .  Cholecystectomy   . Shoulder surgery     Left  . Wrist surgery     Right    Family History  Problem Relation Age of Onset  . Asthma Brother     died age 23  . Diabetes Mother   . Coronary artery disease Mother     died age 25  . Coronary artery disease Father     died age 44  . Coronary artery disease Brother     died age 33  . Coronary artery disease Sister     died age 74    History  Substance Use Topics  . Smoking status: Current Every Day Smoker -- 0.5 packs/day for 40 years    Types: Cigarettes  . Smokeless tobacco: Not on file  . Alcohol Use: No    OB History    Grav Para Term Preterm Abortions TAB SAB Ect Mult Living                  Review of Systems  Constitutional: Positive for fatigue.  Genitourinary: Positive for dysuria and frequency.  Neurological: Positive for weakness.  Psychiatric/Behavioral: Positive for dysphoric mood. Negative for confusion.  All other systems reviewed and are negative.    Allergies  Shellfish allergy; Xylocaine; Ampicillin; Biaxin; Celebrex; Cozaar; Demerol; Elavil; Fosamax; Hydromorphone; Inderal; Ivp dye; Levaquin; Macrodantin; Mycinette; Neggram; Novocain; Pamelor; Paxil; Penicillins; Robaxin; Tamiflu; Tenormin; Triavil; Wellbutrin; and Zoloft  Home Medications   Current Outpatient  Rx  Name  Route  Sig  Dispense  Refill  . AMLODIPINE BESYLATE 5 MG PO TABS   Oral   Take 5 mg by mouth daily.         Marland Kitchen VITAMIN D PO   Oral   Take 1 tablet by mouth daily.         Marland Kitchen CLOPIDOGREL BISULFATE 75 MG PO TABS   Oral   Take 1 tablet (75 mg total) by mouth daily with breakfast.   30 tablet   0   . CO Q-10 50 MG PO CAPS   Oral   Take 1 capsule by mouth daily.         . CYCLOSPORINE 0.05 % OP EMUL   Both Eyes   Place 1 drop into both eyes 2 (two) times daily.         Marland Kitchen ESCITALOPRAM OXALATE 10 MG PO TABS   Oral   Take 10 mg by mouth daily.         Marland Kitchen EZETIMIBE 10 MG PO TABS   Oral   Take 5 mg by mouth  daily.          Marland Kitchen OLANZAPINE 2.5 MG PO TABS   Oral   Take 2.5 mg by mouth daily.         Marland Kitchen PANTOPRAZOLE SODIUM 40 MG PO TBEC   Oral   Take 40 mg by mouth daily.         Frazier Butt FREE OP   Both Eyes   Place 1 drop into both eyes daily as needed. For dry eyes         . ROSUVASTATIN CALCIUM 10 MG PO TABS   Oral   Take 10 mg by mouth daily.           BP 138/52  Pulse 57  Temp 97.9 F (36.6 C) (Oral)  Resp 18  SpO2 97%  Physical Exam  Nursing note and vitals reviewed. Constitutional: She appears well-developed and well-nourished.  HENT:  Head: Normocephalic and atraumatic.  Eyes: EOM are normal.       Older appearing bruising to sclera and right periorbital region  Cardiovascular: Normal rate and regular rhythm.   No murmur heard. Pulmonary/Chest: Effort normal. No respiratory distress. She has no wheezes.  Abdominal: Soft. She exhibits no distension. There is no tenderness. There is no rebound.  Neurological: She is alert. Coordination normal.  Skin: Skin is warm and dry.    ED Course  Procedures (including critical care time)  Labs Reviewed  CBC WITH DIFFERENTIAL - Abnormal; Notable for the following:    RBC 5.22 (*)     Hemoglobin 15.6 (*)     HCT 46.7 (*)     All other components within normal limits  URINALYSIS, ROUTINE W REFLEX MICROSCOPIC - Abnormal; Notable for the following:    APPearance CLOUDY (*)     Bilirubin Urine SMALL (*)     Leukocytes, UA LARGE (*)     All other components within normal limits  COMPREHENSIVE METABOLIC PANEL - Abnormal; Notable for the following:    Glucose, Bld 140 (*)     GFR calc non Af Amer 51 (*)     GFR calc Af Amer 59 (*)     All other components within normal limits  URINE MICROSCOPIC-ADD ON - Abnormal; Notable for the following:    Squamous Epithelial / LPF FEW (*)     Bacteria, UA FEW (*)     All  other components within normal limits  URINE CULTURE   No results found.   1. Dehydration   2. UTI  (lower urinary tract infection)     ra sat is 95% is normal by my interpretation.  8:21 PM On checking orthostatics, SBP drops from 150 to 123, pt gets slightly more tachycardic, and pt was quite symptomatic.     12:00 AM I assisted pt and she is able to ambulate with walker without dizziness.  Will d/c home.  Continue oral abx, pt knows to follow up closely with PCP this week  MDM  Pt with hemo-concentrated blood, UA suggestive of UTI, coupled with her symptoms, will treat with IVF's and oral abx for UTI.  Culture sent.  Will continue to monitor.  Symptoms are not suggestive of CVA, CAD.  No fever, normotensive here.          Gavin Pound. Oletta Lamas, MD 12/21/11 0981

## 2011-12-20 NOTE — ED Notes (Signed)
Patient became very dizzy upon standing.  Dr. Oletta Lamas informed.

## 2011-12-20 NOTE — ED Notes (Signed)
Pt reports having dizziness and extreme fatigue since wed, having burning sensation when urinating. Denies any n/v/d.

## 2011-12-21 ENCOUNTER — Encounter (HOSPITAL_COMMUNITY): Payer: Self-pay

## 2011-12-21 ENCOUNTER — Encounter (HOSPITAL_COMMUNITY): Payer: Self-pay | Admitting: Cardiology

## 2011-12-21 MED ORDER — SULFAMETHOXAZOLE-TRIMETHOPRIM 800-160 MG PO TABS: 1.0000 | ORAL_TABLET | Freq: Two times a day (BID) | ORAL | Status: DC

## 2011-12-21 NOTE — Discharge Instructions (Signed)
 Dehydration, Elderly Dehydration is when you lose more fluids from the body than you take in. Vital organs such as the kidneys, brain, and heart cannot function without a proper amount of fluids and salt. Any loss of fluids from the body can cause dehydration.  Older adults are at a higher risk of dehydration than younger adults. As we age, our bodies are less able to conserve water and do not respond to temperature changes as well. Also, older adults do not become thirsty as easily or quickly. Because of this, older adults often do not realize they need to increase fluids to avoid dehydration.  CAUSES   Vomiting.  Diarrhea.  Excessive sweating.  Excessive urine output.  Fever. SYMPTOMS  Mild dehydration  Thirst.  Dry lips.  Slightly dry mouth. Moderate dehydration  Very dry mouth.  Sunken eyes.  Skin does not bounce back quickly when lightly pinched and released.  Dark urine and decreased urine production.  Decreased tear production.  Headache. Severe dehydration  Very dry mouth.  Extreme thirst.  Rapid, weak pulse (more than 100 beats per minute at rest).  Cold hands and feet.  Not able to sweat in spite of heat.  Rapid breathing.  Blue lips.  Confusion and lethargy.  Difficulty being awakened.  Minimal urine production.  No tears. DIAGNOSIS  Your caregiver will diagnose dehydration based on your symptoms and your exam. Blood and urine tests will help confirm the diagnosis. The diagnostic evaluation should also identify the cause of dehydration. TREATMENT  Treatment of mild or moderate dehydration can often be done at home by increasing the amount of fluids that you drink. It is best to drink small amounts of fluid more often. Drinking too much at one time can make vomiting worse.  Severe dehydration needs to be treated at the hospital where you will probably be given intravenous (IV) fluids that contain water and electrolytes. HOME CARE INSTRUCTIONS     Ask your caregiver about specific rehydration instructions.  Drink enough fluids to keep your urine clear or pale yellow.  Drink small amounts frequently if you have nausea and vomiting.  Eat as you normally do.  Avoid:  Foods or drinks high in sugar.  Carbonated drinks.  Juice.  Extremely hot or cold fluids.  Drinks with caffeine.  Fatty, greasy foods.  Alcohol.  Tobacco.  Overeating.  Gelatin desserts.  Wash your hands well to avoid spreading bacteria and viruses.  Only take over-the-counter or prescription medicines for pain, discomfort, or fever as directed by your caregiver.  Ask your caregiver if you should continue all prescribed and over-the-counter medicines.  Keep all follow-up appointments with your caregiver. SEEK MEDICAL CARE IF:  You have abdominal pain and it increases or stays in one area (localizes).  You have a rash, stiff neck, or severe headache.  You are irritable, sleepy, or difficult to awaken.  You are weak, dizzy, or extremely thirsty. SEEK IMMEDIATE MEDICAL CARE IF:   You are unable to keep fluids down, or you get worse despite treatment.  You have frequent episodes of vomiting or diarrhea.  You have blood or green matter (bile) in your vomit.  You have blood in your stool or your stool looks black and tarry.  You have not urinated in 6 to 8 hours, or you have only urinated a small amount of very dark urine.  You have a fever.  You faint. MAKE SURE YOU:   Understand these instructions.  Will watch your condition.  Will get  help right away if you are not doing well or get worse. Document Released: 04/11/2003 Document Revised: 04/13/2011 Document Reviewed: 09/08/2010 [facility] Patient Information 2013 [place], [place].    Urinary Tract Infection Urinary tract infections (UTIs) can develop anywhere along your urinary tract. Your urinary tract is your body's drainage system for removing wastes and extra water. Your  urinary tract includes two kidneys, two ureters, a bladder, and a urethra. Your kidneys are a pair of bean-shaped organs. Each kidney is about the size of your fist. They are located below your ribs, one on each side of your spine. CAUSES Infections are caused by microbes, which are microscopic organisms, including fungi, viruses, and bacteria. These organisms are so small that they can only be seen through a microscope. Bacteria are the microbes that most commonly cause UTIs. SYMPTOMS  Symptoms of UTIs may vary by age and gender of the patient and by the location of the infection. Symptoms in young women typically include a frequent and intense urge to urinate and a painful, burning feeling in the bladder or urethra during urination. Older women and men are more likely to be tired, shaky, and weak and have muscle aches and abdominal pain. A fever may mean the infection is in your kidneys. Other symptoms of a kidney infection include pain in your back or sides below the ribs, nausea, and vomiting. DIAGNOSIS To diagnose a UTI, your caregiver will ask you about your symptoms. Your caregiver also will ask to provide a urine sample. The urine sample will be tested for bacteria and white blood cells. White blood cells are made by your body to help fight infection. TREATMENT  Typically, UTIs can be treated with medication. Because most UTIs are caused by a bacterial infection, they usually can be treated with the use of antibiotics. The choice of antibiotic and length of treatment depend on your symptoms and the type of bacteria causing your infection. HOME CARE INSTRUCTIONS  If you were prescribed antibiotics, take them exactly as your caregiver instructs you. Finish the medication even if you feel better after you have only taken some of the medication.  Drink enough water and fluids to keep your urine clear or pale yellow.  Avoid caffeine, tea, and carbonated beverages. They tend to irritate your  bladder.  Empty your bladder often. Avoid holding urine for long periods of time.  Empty your bladder before and after sexual intercourse.  After a bowel movement, women should cleanse from front to back. Use each tissue only once. SEEK MEDICAL CARE IF:   You have back pain.  You develop a fever.  Your symptoms do not begin to resolve within 3 days. SEEK IMMEDIATE MEDICAL CARE IF:   You have severe back pain or lower abdominal pain.  You develop chills.  You have nausea or vomiting.  You have continued burning or discomfort with urination. MAKE SURE YOU:   Understand these instructions.  Will watch your condition.  Will get help right away if you are not doing well or get worse. Document Released: 10/29/2004 Document Revised: 07/21/2011 Document Reviewed: 02/27/2011 [facility] Patient Information 2013 [place], [place].

## 2011-12-21 NOTE — CV Procedure (Signed)
Procedure: TEE  Indication: CVA  Sedation: Versed 1 mg IV, 25 mcg IV Fentanyl  Findings: Please see report in echo section for full details.  Normal LV size with moderate LV hypertrophy.  EF 55%.  No LAA thrombus.  There was a small PFO with bubbles crossing at rest.  There was extensive up to grade IV aortic atherosclerosis noted from the arch into the descending thoracic aorta.   No complications.   Yanina Knupp 12/18/2011 10:33 AM  

## 2011-12-22 DIAGNOSIS — R634 Abnormal weight loss: Secondary | ICD-10-CM | POA: Diagnosis not present

## 2011-12-22 DIAGNOSIS — E860 Dehydration: Secondary | ICD-10-CM | POA: Diagnosis not present

## 2011-12-22 DIAGNOSIS — N390 Urinary tract infection, site not specified: Secondary | ICD-10-CM | POA: Diagnosis not present

## 2011-12-22 LAB — URINE CULTURE: Colony Count: >=100000

## 2011-12-25 DIAGNOSIS — R5381 Other malaise: Secondary | ICD-10-CM | POA: Diagnosis not present

## 2011-12-25 DIAGNOSIS — R5383 Other fatigue: Secondary | ICD-10-CM | POA: Diagnosis not present

## 2011-12-25 DIAGNOSIS — N390 Urinary tract infection, site not specified: Secondary | ICD-10-CM | POA: Diagnosis not present

## 2011-12-25 DIAGNOSIS — R634 Abnormal weight loss: Secondary | ICD-10-CM | POA: Diagnosis not present

## 2012-01-20 DIAGNOSIS — E7800 Pure hypercholesterolemia, unspecified: Secondary | ICD-10-CM | POA: Diagnosis not present

## 2012-01-20 DIAGNOSIS — Z0000 Encounter for general adult medical examination without abnormal findings: Secondary | ICD-10-CM | POA: Diagnosis not present

## 2012-03-08 DIAGNOSIS — H43819 Vitreous degeneration, unspecified eye: Secondary | ICD-10-CM | POA: Diagnosis not present

## 2012-03-08 DIAGNOSIS — H211X9 Other vascular disorders of iris and ciliary body, unspecified eye: Secondary | ICD-10-CM | POA: Diagnosis not present

## 2012-03-08 DIAGNOSIS — H35379 Puckering of macula, unspecified eye: Secondary | ICD-10-CM | POA: Diagnosis not present

## 2012-03-08 DIAGNOSIS — H33329 Round hole, unspecified eye: Secondary | ICD-10-CM | POA: Diagnosis not present

## 2012-03-29 DIAGNOSIS — R358 Other polyuria: Secondary | ICD-10-CM | POA: Diagnosis not present

## 2012-03-29 DIAGNOSIS — F329 Major depressive disorder, single episode, unspecified: Secondary | ICD-10-CM | POA: Diagnosis not present

## 2012-03-29 DIAGNOSIS — R5381 Other malaise: Secondary | ICD-10-CM | POA: Diagnosis not present

## 2012-03-29 DIAGNOSIS — Z79899 Other long term (current) drug therapy: Secondary | ICD-10-CM | POA: Diagnosis not present

## 2012-03-29 DIAGNOSIS — E7800 Pure hypercholesterolemia, unspecified: Secondary | ICD-10-CM | POA: Diagnosis not present

## 2012-04-27 DIAGNOSIS — H35379 Puckering of macula, unspecified eye: Secondary | ICD-10-CM | POA: Diagnosis not present

## 2012-04-27 DIAGNOSIS — H33329 Round hole, unspecified eye: Secondary | ICD-10-CM | POA: Diagnosis not present

## 2012-04-28 DIAGNOSIS — Z79899 Other long term (current) drug therapy: Secondary | ICD-10-CM | POA: Diagnosis not present

## 2012-04-28 DIAGNOSIS — M899 Disorder of bone, unspecified: Secondary | ICD-10-CM | POA: Diagnosis not present

## 2012-04-28 DIAGNOSIS — E7800 Pure hypercholesterolemia, unspecified: Secondary | ICD-10-CM | POA: Diagnosis not present

## 2012-04-28 DIAGNOSIS — R7309 Other abnormal glucose: Secondary | ICD-10-CM | POA: Diagnosis not present

## 2012-04-28 DIAGNOSIS — I10 Essential (primary) hypertension: Secondary | ICD-10-CM | POA: Diagnosis not present

## 2012-05-04 DIAGNOSIS — Z1212 Encounter for screening for malignant neoplasm of rectum: Secondary | ICD-10-CM | POA: Diagnosis not present

## 2012-05-04 DIAGNOSIS — B351 Tinea unguium: Secondary | ICD-10-CM | POA: Diagnosis not present

## 2012-05-04 DIAGNOSIS — L259 Unspecified contact dermatitis, unspecified cause: Secondary | ICD-10-CM | POA: Diagnosis not present

## 2012-05-04 DIAGNOSIS — Z0000 Encounter for general adult medical examination without abnormal findings: Secondary | ICD-10-CM | POA: Diagnosis not present

## 2012-05-04 DIAGNOSIS — E7800 Pure hypercholesterolemia, unspecified: Secondary | ICD-10-CM | POA: Diagnosis not present

## 2012-05-04 DIAGNOSIS — R350 Frequency of micturition: Secondary | ICD-10-CM | POA: Diagnosis not present

## 2012-05-04 DIAGNOSIS — I10 Essential (primary) hypertension: Secondary | ICD-10-CM | POA: Diagnosis not present

## 2012-05-11 ENCOUNTER — Ambulatory Visit (INDEPENDENT_AMBULATORY_CARE_PROVIDER_SITE_OTHER): Payer: Medicare Other | Admitting: Cardiology

## 2012-05-11 ENCOUNTER — Encounter: Payer: Self-pay | Admitting: Cardiology

## 2012-05-11 VITALS — BP 136/68 | HR 65 | Ht 61.0 in | Wt 137.0 lb

## 2012-05-11 DIAGNOSIS — I6340 Cerebral infarction due to embolism of unspecified cerebral artery: Secondary | ICD-10-CM

## 2012-05-11 DIAGNOSIS — I639 Cerebral infarction, unspecified: Secondary | ICD-10-CM

## 2012-05-11 DIAGNOSIS — I10 Essential (primary) hypertension: Secondary | ICD-10-CM | POA: Diagnosis not present

## 2012-05-11 DIAGNOSIS — F17200 Nicotine dependence, unspecified, uncomplicated: Secondary | ICD-10-CM | POA: Diagnosis not present

## 2012-05-11 DIAGNOSIS — Z720 Tobacco use: Secondary | ICD-10-CM

## 2012-05-11 NOTE — Progress Notes (Signed)
HPI The patient presents for evaluation after a stroke secondary to emboli to the right retinal artery. She was hospitalized for this. Her work up included carotid Doppler which demonstrated possible proximal stenosis.  However, I don't see where this was quantified. She did have an echocardiogram which demonstrated LVH with an EF of 60%. Moderate tricuspid regurgitation. She did have a CT angiogram which demonstrated moderate distal vertebral artery stenosis. There was no arrhythmia noted.  TEE scheduled after the last appointment demonstrated normal LV size with moderate LV hypertrophy. EF 55%. No LAA thrombus. There was a small PFO with bubbles crossing at rest. There was extensive up to grade IV aortic atherosclerosis noted from the arch into the descending thoracic aorta.    Allergies  Allergen Reactions  . Shellfish Allergy Shortness Of Breath and Rash    All seafood  . Xylocaine (Lidocaine) Shortness Of Breath  . Ampicillin   . Biaxin (Clarithromycin)   . Celebrex (Celecoxib)   . Cozaar (Losartan)     High blood pressure  . Demerol (Meperidine)     unknown  . Elavil (Amitriptyline)   . Fosamax (Alendronate)   . Hydromorphone     Hives   . Inderal (Propranolol)   . Ivp Dye (Iodinated Diagnostic Agents)     rash  . Levaquin (Levofloxacin)   . Macrodantin (Nitrofurantoin)   . Mycinette (Phenol)   . Neggram (Nalidixic Acid)   . Novocain (Procaine)   . Pamelor (Nortriptyline)   . Paxil (Paroxetine)   . Penicillins     rash  . Robaxin (Methocarbamol)   . Tamiflu (Oseltamivir)   . Tenormin (Atenolol)   . Triavil (Perphenazine-Amitriptyline)   . Wellbutrin (Bupropion)   . Zoloft (Sertraline)     Current Outpatient Prescriptions  Medication Sig Dispense Refill  . amLODipine (NORVASC) 5 MG tablet Take 5 mg by mouth daily.      . Cholecalciferol (VITAMIN D PO) Take 1 tablet by mouth daily.      . clopidogrel (PLAVIX) 75 MG tablet Take 1 tablet (75 mg total) by mouth daily  with breakfast.  30 tablet  0  . Coenzyme Q10 (CO Q-10) 50 MG CAPS Take 1 capsule by mouth daily.      . cycloSPORINE (RESTASIS) 0.05 % ophthalmic emulsion Place 1 drop into both eyes 2 (two) times daily.      Marland Kitchen escitalopram (LEXAPRO) 10 MG tablet Take 10 mg by mouth daily.      Marland Kitchen ezetimibe (ZETIA) 10 MG tablet Take 5 mg by mouth daily.       Marland Kitchen OLANZapine (ZYPREXA) 2.5 MG tablet Take 2.5 mg by mouth daily.      . pantoprazole (PROTONIX) 40 MG tablet Take 40 mg by mouth daily.      Bertram Gala Glycol-Propyl Glycol (SYSTANE FREE OP) Place 1 drop into both eyes daily as needed. For dry eyes      . rosuvastatin (CRESTOR) 10 MG tablet Take 10 mg by mouth daily.       No current facility-administered medications for this visit.    Past Medical History  Diagnosis Date  . HTN (hypertension)   . Aortic aneurysm   . Transient ischemic attack   . Murmur, cardiac   . Cholelithiasis   . Internal hemorrhoids   . Irritable bowel syndrome   . Depression   . Eustachian tube dysfunction   . Appendicitis   . CVA (cerebral infarction)     Right retinal branch occlusion  Past Surgical History  Procedure Laterality Date  . Abdominal aortic aneurysm repair  2005  . Kidney stones removed    . Appendectomy    . Abdominal hysterectomy    . Cholecystectomy    . Shoulder surgery      Left  . Wrist surgery      Right  . Tee without cardioversion  12/18/2011    Procedure: TRANSESOPHAGEAL ECHOCARDIOGRAM (TEE);  Surgeon: Laurey Morale, MD;  Location[facility] ENDOSCOPY;  Service: Cardiovascular;  Laterality: N/A;    ROS:  Positive for joint pains. Otherwise as stated in the history of present illness and negative for all other systems.  PHYSICAL EXAM BP 136/68  Pulse 65  Ht 5\' 1"  (1.549 m)  Wt 137 lb (62.143 kg)  BMI 25.9 kg/m2 GENERAL:  Well appearing HEENT:  Pupils equal round and reactive, fundi not visualized, oral mucosa unremarkable, dentures NECK:  No jugular venous distention, waveform  within normal limits, carotid upstroke brisk and symmetric, positive bilateral bruits, no thyromegaly LYMPHATICS:  No cervical, inguinal adenopathy LUNGS:  Clear to auscultation bilaterally CHEST:  Unremarkable HEART:  PMI not displaced or sustained,S1 and S2 within normal limits, no S3, no S4, no clicks, no rubs, 3/6 holosystolic apical and left sternal border murmur ABD:  Flat, positive bowel sounds normal in frequency in pitch, positive bruits, no rebound, no guarding, no midline pulsatile mass, no hepatomegaly, no splenomegaly EXT:  2 plus pulses throughout, no edema, no cyanosis no clubbing SKIN:  No rashes no nodules   EKG:  Sinus rhythm, rate 65, axis within normal limits, intervals within normal limits, in no acute ST-T wave changes. 05/11/2012   ASSESSMENT AND PLAN  Embolic CVA - I reviewed with her the results of her TEE. This was discussed with her at the time of that procedure but secondary to anesthesia a slightly she didn't remember this conversation. It is likely that her embolic event with secondary to atheroemboli. She's had no evidence of dysrhythmia and does not want to wear a 21 day monitor. She understands the importance of lipid control and the importance of stopping smoking though she's unable to do this completely.  PVD - The patient has evidence of diffuse vascular disease. We will concentrate on aggressive risk reduction.  I did again review the results of her multiple imaging studies done in the hospital. She is at higher risk for future cardiovascular events but there is no indication for intervention other than medical therapy and risk reduction at this point. I will schedule followup carotid Doppler to assess for  Tobacco abuse - We discussed this. She doesn't think she would at this time although she has cut back.  Dyslipidemia - This is followed by Dr. Renne Crigler.  She will remain on Crestor.

## 2012-05-11 NOTE — Patient Instructions (Addendum)

## 2012-06-02 DIAGNOSIS — R350 Frequency of micturition: Secondary | ICD-10-CM | POA: Diagnosis not present

## 2012-07-18 DIAGNOSIS — H3410 Central retinal artery occlusion, unspecified eye: Secondary | ICD-10-CM | POA: Diagnosis not present

## 2012-07-18 DIAGNOSIS — H04129 Dry eye syndrome of unspecified lacrimal gland: Secondary | ICD-10-CM | POA: Diagnosis not present

## 2012-07-18 DIAGNOSIS — H2510 Age-related nuclear cataract, unspecified eye: Secondary | ICD-10-CM | POA: Diagnosis not present

## 2012-07-27 DIAGNOSIS — H3410 Central retinal artery occlusion, unspecified eye: Secondary | ICD-10-CM | POA: Diagnosis not present

## 2012-07-27 DIAGNOSIS — H35379 Puckering of macula, unspecified eye: Secondary | ICD-10-CM | POA: Diagnosis not present

## 2012-07-27 DIAGNOSIS — H211X9 Other vascular disorders of iris and ciliary body, unspecified eye: Secondary | ICD-10-CM | POA: Diagnosis not present

## 2012-10-18 DIAGNOSIS — R350 Frequency of micturition: Secondary | ICD-10-CM | POA: Diagnosis not present

## 2012-10-18 DIAGNOSIS — N3010 Interstitial cystitis (chronic) without hematuria: Secondary | ICD-10-CM | POA: Diagnosis not present

## 2012-10-18 DIAGNOSIS — N390 Urinary tract infection, site not specified: Secondary | ICD-10-CM | POA: Diagnosis not present

## 2012-10-18 DIAGNOSIS — F329 Major depressive disorder, single episode, unspecified: Secondary | ICD-10-CM | POA: Diagnosis not present

## 2012-10-21 DIAGNOSIS — G609 Hereditary and idiopathic neuropathy, unspecified: Secondary | ICD-10-CM | POA: Diagnosis not present

## 2012-10-21 DIAGNOSIS — N3010 Interstitial cystitis (chronic) without hematuria: Secondary | ICD-10-CM | POA: Diagnosis not present

## 2012-10-21 DIAGNOSIS — Z833 Family history of diabetes mellitus: Secondary | ICD-10-CM | POA: Diagnosis not present

## 2012-10-21 DIAGNOSIS — F329 Major depressive disorder, single episode, unspecified: Secondary | ICD-10-CM | POA: Diagnosis not present

## 2012-10-25 DIAGNOSIS — Z833 Family history of diabetes mellitus: Secondary | ICD-10-CM | POA: Diagnosis not present

## 2012-10-25 DIAGNOSIS — G609 Hereditary and idiopathic neuropathy, unspecified: Secondary | ICD-10-CM | POA: Diagnosis not present

## 2012-11-03 DIAGNOSIS — L821 Other seborrheic keratosis: Secondary | ICD-10-CM | POA: Diagnosis not present

## 2012-11-03 DIAGNOSIS — F329 Major depressive disorder, single episode, unspecified: Secondary | ICD-10-CM | POA: Diagnosis not present

## 2012-11-14 ENCOUNTER — Ambulatory Visit (INDEPENDENT_AMBULATORY_CARE_PROVIDER_SITE_OTHER): Payer: Medicare Other | Admitting: Neurology

## 2012-11-14 ENCOUNTER — Encounter (INDEPENDENT_AMBULATORY_CARE_PROVIDER_SITE_OTHER): Payer: Self-pay

## 2012-11-14 ENCOUNTER — Encounter: Payer: Self-pay | Admitting: Neurology

## 2012-11-14 VITALS — BP 121/57 | HR 70 | Temp 97.9°F | Ht 60.5 in | Wt 140.0 lb

## 2012-11-14 DIAGNOSIS — I6340 Cerebral infarction due to embolism of unspecified cerebral artery: Secondary | ICD-10-CM | POA: Diagnosis not present

## 2012-11-14 DIAGNOSIS — G609 Hereditary and idiopathic neuropathy, unspecified: Secondary | ICD-10-CM | POA: Diagnosis not present

## 2012-11-14 DIAGNOSIS — R52 Pain, unspecified: Secondary | ICD-10-CM | POA: Diagnosis not present

## 2012-11-14 DIAGNOSIS — I10 Essential (primary) hypertension: Secondary | ICD-10-CM | POA: Diagnosis not present

## 2012-11-14 DIAGNOSIS — H5440 Blindness, one eye, unspecified eye: Secondary | ICD-10-CM

## 2012-11-14 DIAGNOSIS — Z720 Tobacco use: Secondary | ICD-10-CM

## 2012-11-14 DIAGNOSIS — E785 Hyperlipidemia, unspecified: Secondary | ICD-10-CM | POA: Diagnosis not present

## 2012-11-14 DIAGNOSIS — H34239 Retinal artery branch occlusion, unspecified eye: Secondary | ICD-10-CM

## 2012-11-14 DIAGNOSIS — F17200 Nicotine dependence, unspecified, uncomplicated: Secondary | ICD-10-CM

## 2012-11-14 DIAGNOSIS — I999 Unspecified disorder of circulatory system: Secondary | ICD-10-CM | POA: Diagnosis not present

## 2012-11-14 DIAGNOSIS — I639 Cerebral infarction, unspecified: Secondary | ICD-10-CM

## 2012-11-14 DIAGNOSIS — F329 Major depressive disorder, single episode, unspecified: Secondary | ICD-10-CM | POA: Diagnosis not present

## 2012-11-14 DIAGNOSIS — F3289 Other specified depressive episodes: Secondary | ICD-10-CM | POA: Diagnosis not present

## 2012-11-14 DIAGNOSIS — H34231 Retinal artery branch occlusion, right eye: Secondary | ICD-10-CM

## 2012-11-14 MED ORDER — GABAPENTIN 100 MG PO CAPS: ORAL_CAPSULE | ORAL | Status: DC

## 2012-11-14 NOTE — Patient Instructions (Addendum)
I think overall you are doing fairly well but I do want to suggest a few things today:  Remember to drink plenty of fluid, eat healthy meals and do not skip any meals. Try to eat protein with a every meal and eat a healthy snack such as fruit or nuts in between meals. Try to keep a regular sleep-wake schedule and try to exercise daily, particularly in the form of walking, 20-30 minutes a day, if you can.   Engage in social activities in your community and with your family and try to keep up with current events by reading the newspaper or watching the news.   As far as your medications are concerned, I would like to suggest a trial of Neurontin (gabapentin) 100 mg strength: Take 1 pill at bedtime as needed for burning pain. The most common side effects reported are sedation or sleepiness. Rare side effects include balance problems, confusion.   As far as diagnostic testing: EMG and NCV testing, blood work.   I would like to see you back in 3 months, sooner if we need to. Please call us with any interim questions, concerns, problems, updates or refill requests.  Please also call us for any test results so we can go over those with you on the phone. Brett Canales is my clinical assistant and will answer any of your questions and relay your messages to me and also relay most of my messages to you.  Our phone number is [phone]. We also have an after hours call service for urgent matters and there is a physician on-call for urgent questions. For any emergencies you know to call 911 or go to the nearest emergency room.

## 2012-11-14 NOTE — Progress Notes (Addendum)
Subjective:    Patient ID: Allison [name] is a 79 y.o. female.  HPI  Allison Foley, MD, PhD [facility] [address] [address] [place], [place] [address]  Dear Dr. Renne [name],   I saw your patient, Allison [name], upon your kind request in my neurologic clinic today for initial consultation of her burning-like pain. The patient is accompanied by her husband today. As you know, Allison [name] is a very pleasant 79 year old right-handed woman with an underlying medical history of hyperlipidemia, hypertension, depression, TIA, stroke affecting her right eye, abdominal aortic aneurysm repair in 2005, irritable bowel syndrome, interstitial cystitis, vascular disease, emphysema, carotid artery disease, osteoporosis and smoking, who has been experiencing burning leg pain for the past several months. She has had Sx for the past 10 years and Sx are intermittent, not necessarily with walking, it can be at rest. She describes a burning sensation in the calves and in her hamstrings. Walking seems to make Sx somewhat better, but she has occasional cramps in her hamstrings when walking. She is suspected to have peripheral neuropathy. She may have had EMG/NCV in her LEs years ago. She does not endorse RLS Sx and no clear claudidation. She has not noted any weakness, no numbness or tingling.  She had a brain MRI and MRA in September 2013 and I reviewed her test results: Small areas of acute infarction in the cerebellum bilaterally. Small recent infarction right occipital lobe. Atrophy and moderate chronic microvascular ischemic changes. No acute abnormality in the orbit or optic nerve. Moderate stenosis of the distal left vertebral artery. Right vertebral artery and basilar are patent. She was hospitalized at [facility] for her stroke and had full w/u including C. doppler, echo and was changed from ASA to plavix, which she continues to take.   Her Past Medical History Is Significant For: Past Medical  History  Diagnosis Date  . HTN (hypertension)   . Aortic aneurysm   . Transient ischemic attack   . Murmur, cardiac   . Cholelithiasis   . Internal hemorrhoids   . Irritable bowel syndrome   . Depression   . Eustachian tube dysfunction   . Appendicitis   . CVA (cerebral infarction)     Right retinal branch occlusion    Her Past Surgical History Is Significant For: Past Surgical History  Procedure Laterality Date  . Abdominal aortic aneurysm repair  2005  . Kidney stones removed    . Appendectomy    . Abdominal hysterectomy    . Cholecystectomy    . Shoulder surgery      Left  . Wrist surgery      Right  . Tee without cardioversion  12/18/2011    Procedure: TRANSESOPHAGEAL ECHOCARDIOGRAM (TEE);  Surgeon: Laurey Morale, MD;  Location: [facility] ENDOSCOPY;  Service: Cardiovascular;  Laterality: N/A;    Her Family History Is Significant For: Family History  Problem Relation Age of Onset  . Asthma Brother     died age 85  . Diabetes Mother   . Coronary artery disease Mother     died age 38  . Coronary artery disease Father     died age 67  . Coronary artery disease Brother     died age 12  . Coronary artery disease Sister     died age 58    Her Social History Is Significant For: History   Social History  . Marital Status: Married    Spouse Name: N/A    Number  of Children: N/A  . Years of Education: N/A   Social History Main Topics  . Smoking status: Current Every Day Smoker -- 0.50 packs/day for 40 years    Types: Cigarettes  . Smokeless tobacco: None  . Alcohol Use: No  . Drug Use: No  . Sexual Activity: None   Other Topics Concern  . None   Social History Narrative   Lives with husband.       Her Allergies Are:  Allergies  Allergen Reactions  . Shellfish Allergy Shortness Of Breath and Rash    All seafood  . Xylocaine [Lidocaine] Shortness Of Breath  . Ampicillin   . Biaxin [Clarithromycin]   . Celebrex [Celecoxib]   . Cozaar [Losartan]      High blood pressure  . Demerol [Meperidine]     unknown  . Elavil [Amitriptyline]   . Fosamax [Alendronate]   . Hydromorphone     Hives   . Inderal [Propranolol]   . Ivp Dye [Iodinated Diagnostic Agents]     rash  . Levaquin [Levofloxacin]   . Macrodantin [Nitrofurantoin]   . Mycinette [Phenol]   . Neggram [Nalidixic Acid]   . Novocain [Procaine]   . Pamelor [Nortriptyline]   . Paxil [Paroxetine]   . Penicillins     rash  . Robaxin [Methocarbamol]   . Tamiflu [Oseltamivir]   . Tenormin [Atenolol]   . Triavil [Perphenazine-Amitriptyline]   . Wellbutrin [Bupropion]   . Zoloft [Sertraline]   :   Her Current Medications Are:  Outpatient Encounter Prescriptions as of 11/14/2012  Medication Sig Dispense Refill  . amLODipine (NORVASC) 5 MG tablet Take 5 mg by mouth daily.      . Cholecalciferol (VITAMIN D PO) Take 1 tablet by mouth daily.      . clopidogrel (PLAVIX) 75 MG tablet Take 1 tablet by mouth daily.      . Coenzyme Q10 (CO Q-10) 50 MG CAPS Take 1 capsule by mouth daily.      . cycloSPORINE (RESTASIS) 0.05 % ophthalmic emulsion Place 1 drop into both eyes 2 (two) times daily.      Marland Kitchen escitalopram (LEXAPRO) 10 MG tablet Take 10 mg by mouth daily.      Marland Kitchen ezetimibe (ZETIA) 10 MG tablet Take 5 mg by mouth daily.       Marland Kitchen OLANZapine (ZYPREXA) 2.5 MG tablet Take 2.5 mg by mouth daily.      . pantoprazole (PROTONIX) 40 MG tablet Take 40 mg by mouth daily.      Bertram Gala Glycol-Propyl Glycol (SYSTANE FREE OP) Place 1 drop into both eyes daily as needed. For dry eyes      . rosuvastatin (CRESTOR) 10 MG tablet Take 10 mg by mouth daily.      Marland Kitchen gabapentin (NEURONTIN) 100 MG capsule Take 1 pill at bedtime as needed  30 capsule  3   No facility-administered encounter medications on file as of 11/14/2012.   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  Musculoskeletal:       Occasional cramps in the back of left thigh when walking    Objective:  Neurologic Exam  Physical Exam Physical Examination:   Filed Vitals:   11/14/12 1351  BP: 121/57  Pulse: 70  Temp: 97.9  F (36.6 C)    General Examination: The patient is a very pleasant 79 y.o. female in no acute distress. She appears well-developed and well-nourished and well groomed.   HEENT: Normocephalic, atraumatic, pupils are equal, round and reactive to light and accommodation. 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 Extraocular tracking is good without limitation to gaze excursion or nystagmus noted. Normal smooth pursuit is noted. Hearing is grossly intact. Face is symmetric with normal facial animation and normal facial sensation. Speech is clear with no dysarthria noted. There is no hypophonia. There is no lip, neck/head, jaw or voice tremor. Neck is supple with full range of passive and active motion. There are no carotid bruits on auscultation. Oropharynx exam reveals: mild mouth dryness, adequate dental hygiene and mild airway crowding, due to narrow airway. Mallampati is class II. Tongue protrudes centrally and palate elevates symmetrically.    Chest: Clear to auscultation without wheezing, rhonchi or crackles noted.  Heart: S1+S2+0, regular and normal, with a 3/6 pansystolic murmur, which radiates to both carotid arteries, left more than right. No rubs or gallops noted.   Abdomen: Soft, non-tender and non-distended with normal bowel sounds appreciated on auscultation.  Extremities: There is no pitting edema in the distal lower extremities bilaterally. Pedal pulses are intact.  Skin: Warm and dry without trophic changes noted. There are no varicose veins.  Musculoskeletal: exam reveals no obvious joint deformities, tenderness or joint swelling or erythema.   Neurologically:  Mental status: The patient is awake,  alert and oriented in all 4 spheres. Her memory, attention, language and knowledge are appropriate. There is no aphasia, agnosia, apraxia or anomia. Speech is clear with normal prosody and enunciation. Thought process is linear. Mood is congruent and affect is normal.  Cranial nerves are as described above under HEENT exam. In addition, shoulder shrug is normal with equal shoulder height noted. Motor exam: Normal bulk, strength and tone is noted. There is no drift, tremor or rebound. Romberg is negative. Reflexes are 2+ throughout. Toes are downgoing bilaterally. Fine motor skills are intact with normal finger taps, normal hand movements, normal rapid alternating patting, normal foot taps and normal foot agility.  Cerebellar testing shows no dysmetria or intention tremor on finger to nose testing. Heel to shin is unremarkable bilaterally. There is no truncal or gait ataxia.  Sensory exam is intact to light touch, pinprick, vibration, temperature sense and proprioception in the upper and lower extremities with the exception of decrease in vibration sense in both distal LEs.  Gait, station and balance are unremarkable. No veering to one side is noted. No leaning to one side is noted. Posture is age-appropriate and stance is narrow based. No problems turning are noted. She turns en bloc.         Assessment and Plan:   In summary, TEVIS CONGER is a very pleasant 79 y.o.-year old female with a history of stroke, HTN, smoking, who presents with a several year history of burning pain in both lower extremities which is intermittent and has not progressed very much over the course of almost a decade. 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 I am not sure how to tie in her symptoms together. She is at risk for vascular disease but her symptoms are not classic for claudication. She  may have mild peripheral neuropathy. She has significant risk factors for  vascular disease. I talked to her at length about a healthy lifestyle in general including smoking cessation.   I had a long chat with the patient about my findings and her Sx. differential diagnosis includes restless legs symptoms with atypical presentation, neuropathy, peripheral vascular disease. I would like to proceed with EMG and nerve conduction testing as well as additional blood work to look for TSH, hemoglobin A1c, ESR, RPR, ANA, CK. For symptomatic treatment I suggested a trial of low-dose gabapentin. Considering her age and the fact that she is taking multiple other medications I would like to use this only as needed at the lowest dose, namely 100 mg strength at night as needed. I talked to her about potential side effects and provided her with a new prescription. I would like to see her back in 3 months from now and in the interim we should be able to call her with her test results. She was in agreement.  Thank you very much for allowing me to participate in the care of this nice patient. If I can be of any further assistance to you please do not hesitate to call me at [phone].  Sincerely,   Allison Foley, MD, PhD

## 2012-11-16 ENCOUNTER — Telehealth: Payer: Self-pay | Admitting: *Deleted

## 2012-11-16 NOTE — Telephone Encounter (Signed)
Spoke to [facility] and they relayed the new corrected report to come with [facility] when available.

## 2012-11-17 LAB — TSH: TSH: 1.55 u[IU]/mL (ref 0.450–4.500)

## 2012-11-17 LAB — HGB A1C W/O EAG: Hgb A1c MFr Bld: 5.9 % — ABNORMAL HIGH (ref 4.8–5.6)

## 2012-11-17 LAB — CK TOTAL AND CKMB (NOT AT ARMC): CK-MB Index: 2.8 ng/mL (ref 0.0–2.9)

## 2012-11-17 LAB — HEAVY METALS PROFILE II, BLOOD
Cadmium: 1.5 ug/L — ABNORMAL HIGH (ref 0.0–1.2)
Lead, Blood: 2 ug/dL (ref 0–19)
Mercury: NOT DETECTED ug/L (ref 0.0–14.9)

## 2012-11-18 NOTE — Progress Notes (Signed)
Quick Note:  Called patient to inform of unremarkable labs, left vm message of the unremarkable labs shared below by Dr Frances Furbish. ______

## 2012-11-18 NOTE — Progress Notes (Signed)
Quick Note:  Please call patient and advise her that her labs for the most part were unremarkable. This includes muscle enzymes, thyroid screening, inflammatory markers. In her heavy metal profile her cadmium level was slightly elevated from the normal cutoff, but this is not unexpected in a smoker. Her hemoglobin A1c which is a diabetes marker was borderline, indicating risk for diabetes but no evidence of frank diabetes. No further action is required on these tests at this time. Please remind patient to keep any pending appointments or tests. Huston Foley, MD, PhD [facility] ([facility]  ______

## 2012-11-22 ENCOUNTER — Encounter: Payer: Medicare Other | Admitting: Neurology

## 2012-11-23 ENCOUNTER — Ambulatory Visit (INDEPENDENT_AMBULATORY_CARE_PROVIDER_SITE_OTHER): Payer: Medicare Other | Admitting: Neurology

## 2012-11-23 ENCOUNTER — Encounter (INDEPENDENT_AMBULATORY_CARE_PROVIDER_SITE_OTHER): Payer: Medicare Other

## 2012-11-23 DIAGNOSIS — G609 Hereditary and idiopathic neuropathy, unspecified: Secondary | ICD-10-CM

## 2012-11-23 DIAGNOSIS — G544 Lumbosacral root disorders, not elsewhere classified: Secondary | ICD-10-CM | POA: Diagnosis not present

## 2012-11-23 DIAGNOSIS — Z0289 Encounter for other administrative examinations: Secondary | ICD-10-CM

## 2012-11-23 DIAGNOSIS — R52 Pain, unspecified: Secondary | ICD-10-CM

## 2012-11-23 NOTE — Procedures (Signed)
  HISTORY:  Allison [name] is an 79 year old patient with a history of some burning sensations in the feet and legs, and cramping in the left posterior thigh with walking. The patient is being evaluated for a possible peripheral neuropathy.  NERVE CONDUCTION STUDIES:  Nerve conduction studies were performed on both lower extremities. The distal motor latencies and motor amplitudes for the peroneal and posterior tibial nerves were within normal limits. The nerve conduction velocities for these nerves were also normal. The H reflex latencies were normal. The sensory latencies for the peroneal nerves were within normal limits.   EMG STUDIES:  EMG study was performed on the left lower extremity:  The tibialis anterior muscle reveals 2 to 4K motor units with full recruitment. No fibrillations or positive waves were seen. The peroneus tertius muscle reveals 2 to 4K motor units with full recruitment. No fibrillations or positive waves were seen. The medial gastrocnemius muscle reveals 1 to 3K motor units with full recruitment. 2 plus fibrillations and positive waves were seen. The vastus lateralis muscle reveals 2 to 4K motor units with full recruitment. No fibrillations or positive waves were seen. The iliopsoas muscle reveals 2 to 4K motor units with full recruitment. No fibrillations or positive waves were seen. The biceps femoris muscle (long head) reveals 2 to 4K motor units with full recruitment. No fibrillations or positive waves were seen. Complex repetitive discharges were seen The lumbosacral paraspinal muscles were tested at 3 levels, and revealed no abnormalities of insertional activity at the upper and middle levels tested. One plus fibrillations and positive waves were seen at the lower level. There was good relaxation.  EMG study was performed on the right lower extremity:  The tibialis anterior muscle reveals 2 to 4K motor units with full recruitment. No fibrillations or positive waves  were seen. The peroneus tertius muscle reveals 2 to 5K motor units with full recruitment. No fibrillations or positive waves were seen. The medial gastrocnemius muscle reveals 1 to 3K motor units with full recruitment. No fibrillations or positive waves were seen. The vastus lateralis muscle reveals 2 to 4K motor units with full recruitment. No fibrillations or positive waves were seen. The iliopsoas muscle reveals 2 to 4K motor units with full recruitment. No fibrillations or positive waves were seen. Complex repetitive discharges were seen. The biceps femoris muscle (long head) reveals 2 to 4K motor units with full recruitment. No fibrillations or positive waves were seen. Complex repetitive discharges were seen. The lumbosacral paraspinal muscles were tested at 3 levels, and revealed no abnormalities of insertional activity at all 3 levels tested. There was good relaxation.   IMPRESSION:  Nerve conduction studies done on both lower extremities were within normal limits. No evidence of a peripheral neuropathy is seen. A small fiber neuropathy may be missed however, on standard nerve conduction studies. Clinical correlation is required. EMG evaluation of the left lower extremity shows findings most consistent with a mild acute and chronic S1 radiculopathy. EMG evaluation of the right lower extremity was relatively unremarkable.  Marlan Palau MD 11/23/2012 4:23 PM  [facility] [address] [place], [place] [address]  Phone [phone] Fax [phone]

## 2012-11-25 NOTE — Progress Notes (Signed)
Quick Note:  Please call patient and let her know that her muscle and nerve electrical testing showed no clear evidence of neuropathy, but there are findings consistent with pinched nerve from the back. These findings are rather mild. Unless she has significant low back pain we may not have to do any additional testing at this time. Huston Foley, MD, PhD [facility]  ______

## 2012-12-01 NOTE — Progress Notes (Signed)
Quick Note:  Shared with patient NCV /EMG findings per Dr Teofilo Pod notes below, patient understood ______

## 2012-12-08 DIAGNOSIS — F329 Major depressive disorder, single episode, unspecified: Secondary | ICD-10-CM | POA: Diagnosis not present

## 2012-12-08 DIAGNOSIS — D485 Neoplasm of uncertain behavior of skin: Secondary | ICD-10-CM | POA: Diagnosis not present

## 2012-12-08 DIAGNOSIS — M79609 Pain in unspecified limb: Secondary | ICD-10-CM | POA: Diagnosis not present

## 2012-12-26 DIAGNOSIS — Z85828 Personal history of other malignant neoplasm of skin: Secondary | ICD-10-CM | POA: Diagnosis not present

## 2012-12-26 DIAGNOSIS — L570 Actinic keratosis: Secondary | ICD-10-CM | POA: Diagnosis not present

## 2012-12-26 DIAGNOSIS — L821 Other seborrheic keratosis: Secondary | ICD-10-CM | POA: Diagnosis not present

## 2012-12-28 ENCOUNTER — Emergency Department (HOSPITAL_COMMUNITY)
Admission: EM | Admit: 2012-12-28 | Discharge: 2012-12-29 | Disposition: A | Discharge status: Home / Self Care | Payer: Medicare Other | Attending: Emergency Medicine | Admitting: Emergency Medicine

## 2012-12-28 ENCOUNTER — Encounter (HOSPITAL_COMMUNITY): Payer: Self-pay | Admitting: Emergency Medicine

## 2012-12-28 DIAGNOSIS — F329 Major depressive disorder, single episode, unspecified: Secondary | ICD-10-CM | POA: Insufficient documentation

## 2012-12-28 DIAGNOSIS — R52 Pain, unspecified: Secondary | ICD-10-CM | POA: Diagnosis not present

## 2012-12-28 DIAGNOSIS — F17200 Nicotine dependence, unspecified, uncomplicated: Secondary | ICD-10-CM | POA: Diagnosis not present

## 2012-12-28 DIAGNOSIS — Z8719 Personal history of other diseases of the digestive system: Secondary | ICD-10-CM | POA: Insufficient documentation

## 2012-12-28 DIAGNOSIS — I10 Essential (primary) hypertension: Secondary | ICD-10-CM | POA: Diagnosis not present

## 2012-12-28 DIAGNOSIS — R1013 Epigastric pain: Secondary | ICD-10-CM | POA: Diagnosis not present

## 2012-12-28 DIAGNOSIS — Z7902 Long term (current) use of antithrombotics/antiplatelets: Secondary | ICD-10-CM | POA: Insufficient documentation

## 2012-12-28 DIAGNOSIS — R1084 Generalized abdominal pain: Secondary | ICD-10-CM | POA: Diagnosis not present

## 2012-12-28 DIAGNOSIS — R011 Cardiac murmur, unspecified: Secondary | ICD-10-CM | POA: Diagnosis not present

## 2012-12-28 DIAGNOSIS — Z8669 Personal history of other diseases of the nervous system and sense organs: Secondary | ICD-10-CM | POA: Insufficient documentation

## 2012-12-28 DIAGNOSIS — R109 Unspecified abdominal pain: Secondary | ICD-10-CM

## 2012-12-28 DIAGNOSIS — Z880 Allergy status to penicillin: Secondary | ICD-10-CM | POA: Diagnosis not present

## 2012-12-28 DIAGNOSIS — Z79899 Other long term (current) drug therapy: Secondary | ICD-10-CM | POA: Diagnosis not present

## 2012-12-28 DIAGNOSIS — F3289 Other specified depressive episodes: Secondary | ICD-10-CM | POA: Insufficient documentation

## 2012-12-28 DIAGNOSIS — R10819 Abdominal tenderness, unspecified site: Secondary | ICD-10-CM | POA: Diagnosis not present

## 2012-12-28 DIAGNOSIS — Z8673 Personal history of transient ischemic attack (TIA), and cerebral infarction without residual deficits: Secondary | ICD-10-CM | POA: Insufficient documentation

## 2012-12-28 DIAGNOSIS — K2970 Gastritis, unspecified, without bleeding: Secondary | ICD-10-CM | POA: Diagnosis not present

## 2012-12-28 LAB — CBC WITH DIFFERENTIAL/PLATELET
Basophils Relative: 0 % (ref 0–1)
Eosinophils Absolute: 0.1 10*3/uL (ref 0.0–0.7)
Eosinophils Relative: 1 % (ref 0–5)
HCT: 40.2 % (ref 36.0–46.0)
Hemoglobin: 13.7 g/dL (ref 12.0–15.0)
MCH: 30.6 pg (ref 26.0–34.0)
MCHC: 34.1 g/dL (ref 30.0–36.0)
Monocytes Absolute: 1.2 10*3/uL — ABNORMAL HIGH (ref 0.1–1.0)
Monocytes Relative: 10 % (ref 3–12)
RDW: 13.6 % (ref 11.5–15.5)

## 2012-12-28 LAB — URINE MICROSCOPIC-ADD ON

## 2012-12-28 LAB — URINALYSIS, ROUTINE W REFLEX MICROSCOPIC
Ketones, ur: NEGATIVE mg/dL
Nitrite: NEGATIVE
Specific Gravity, Urine: 1.016 (ref 1.005–1.030)
pH: 5.5 (ref 5.0–8.0)

## 2012-12-28 LAB — COMPREHENSIVE METABOLIC PANEL
Albumin: 3.6 g/dL (ref 3.5–5.2)
BUN: 10 mg/dL (ref 6–23)
Calcium: 9.3 mg/dL (ref 8.4–10.5)
Creatinine, Ser: 0.72 mg/dL (ref 0.50–1.10)
Total Bilirubin: 0.3 mg/dL (ref 0.3–1.2)
Total Protein: 6.5 g/dL (ref 6.0–8.3)

## 2012-12-28 LAB — LIPASE, BLOOD: Lipase: 42 U/L (ref 11–59)

## 2012-12-28 MED ORDER — POTASSIUM CHLORIDE CRYS ER 20 MEQ PO TBCR
40.0000 meq | EXTENDED_RELEASE_TABLET | Freq: Once | ORAL | Status: AC
  Administered 2012-12-28: 40 meq via ORAL
  Filled 2012-12-28: qty 2

## 2012-12-28 MED ORDER — DIPHENHYDRAMINE HCL 50 MG/ML IJ SOLN
12.5000 mg | Freq: Once | INTRAMUSCULAR | Status: AC
  Administered 2012-12-28: 12.5 mg via INTRAVENOUS
  Filled 2012-12-28: qty 1

## 2012-12-28 MED ORDER — ALUM & MAG HYDROXIDE-SIMETH 200-200-20 MG/5ML PO SUSP
15.0000 mL | Freq: Once | ORAL | Status: AC
  Administered 2012-12-28: 15 mL via ORAL
  Filled 2012-12-28: qty 30

## 2012-12-28 MED ORDER — SODIUM CHLORIDE 0.9 % IV BOLUS (SEPSIS)
500.0000 mL | Freq: Once | INTRAVENOUS | Status: AC
  Administered 2012-12-28: 500 mL via INTRAVENOUS

## 2012-12-28 MED ORDER — ACETAMINOPHEN 325 MG PO TABS
650.0000 mg | ORAL_TABLET | Freq: Once | ORAL | Status: AC
  Administered 2012-12-28: 650 mg via ORAL
  Filled 2012-12-28: qty 2

## 2012-12-28 MED ORDER — DIPHENHYDRAMINE HCL 25 MG PO CAPS
25.0000 mg | ORAL_CAPSULE | Freq: Once | ORAL | Status: DC
  Filled 2012-12-28: qty 1

## 2012-12-28 MED ORDER — ONDANSETRON HCL 4 MG/2ML IJ SOLN
4.0000 mg | Freq: Once | INTRAMUSCULAR | Status: AC
  Administered 2012-12-28: 4 mg via INTRAVENOUS
  Filled 2012-12-28: qty 2

## 2012-12-28 NOTE — ED Notes (Signed)
Pt States when pain came it was sharp, radiated to back and she broke out into sweat

## 2012-12-28 NOTE — ED Provider Notes (Signed)
CSN: [id number]     Arrival date & time 12/28/12  1919 History   First MD Initiated Contact with Patient 12/28/12 1945     Chief Complaint  Patient presents with  . Abdominal Pain   (Consider location/radiation/quality/duration/timing/severity/associated sxs/prior Treatment) Patient is a 79 y.o. female presenting with abdominal pain.  Abdominal Pain Pain location:  Generalized Pain quality: aching and sharp   Pain radiates to:  Does not radiate Pain severity:  Severe Onset quality:  Unable to specify Duration:  3 hours Timing:  Constant Progression:  Partially resolved Chronicity:  New Associated symptoms: no chest pain, no dysuria, no fatigue, no fever and no shortness of breath    Chief complaint: Abdominal pain  79 year old female complaining of 2 hours of abdominal pain. Patient states is a diffuse crampy, sharp pain. He was initially 10 out of 10 intensity however it is slightly improved with time. No radiation of pain. Patient did have some diarrhea yesterday however reports none today. The treatments attempted. No aggravating or alleviating factors noted.  Past Medical History  Diagnosis Date  . HTN (hypertension)   . Aortic aneurysm   . Transient ischemic attack   . Murmur, cardiac   . Cholelithiasis   . Internal hemorrhoids   . Irritable bowel syndrome   . Depression   . Eustachian tube dysfunction   . Appendicitis   . CVA (cerebral infarction)     Right retinal branch occlusion   Past Surgical History  Procedure Laterality Date  . Abdominal aortic aneurysm repair  2005  . Kidney stones removed    . Appendectomy    . Abdominal hysterectomy    . Cholecystectomy    . Shoulder surgery      Left  . Wrist surgery      Right  . Tee without cardioversion  12/18/2011    Procedure: TRANSESOPHAGEAL ECHOCARDIOGRAM (TEE);  Surgeon: Laurey Morale, MD;  Location: [facility] ENDOSCOPY;  Service: Cardiovascular;  Laterality: N/A;   Family History  Problem Relation Age of  Onset  . Asthma Brother     died age 79  . Diabetes Mother   . Coronary artery disease Mother     died age 100  . Coronary artery disease Father     died age 45  . Coronary artery disease Brother     died age 38  . Coronary artery disease Sister     died age 16   History  Substance Use Topics  . Smoking status: Current Every Day Smoker -- 0.50 packs/day for 40 years    Types: Cigarettes  . Smokeless tobacco: Not on file  . Alcohol Use: No   OB History   Grav Para Term Preterm Abortions TAB SAB Ect Mult Living                 Review of Systems  Constitutional: Negative for fever and fatigue.  Respiratory: Negative for shortness of breath.   Cardiovascular: Negative for chest pain.  Gastrointestinal: Positive for abdominal pain.  Genitourinary: Negative for dysuria.  Skin: Negative for rash.  Neurological: Negative for headaches.  All other systems reviewed and are negative.    Allergies  Shellfish allergy; Xylocaine; Ampicillin; Biaxin; Celebrex; Cozaar; Demerol; Elavil; Fosamax; Hydromorphone; Inderal; Ivp dye; Levaquin; Macrodantin; Mycinette; Neggram; Novocain; Pamelor; Paxil; Penicillins; Robaxin; Tamiflu; Tenormin; Triavil; Wellbutrin; and Zoloft  Home Medications   Current Outpatient Rx  Name  Route  Sig  Dispense  Refill  . amLODipine (NORVASC) 5 MG tablet  Oral   Take 5 mg by mouth daily.         . Cholecalciferol (VITAMIN D PO)   Oral   Take 1 tablet by mouth daily.         . clopidogrel (PLAVIX) 75 MG tablet   Oral   Take 1 tablet by mouth daily.         . Coenzyme Q10 (CO Q-10) 50 MG CAPS   Oral   Take 1 capsule by mouth daily.         . cycloSPORINE (RESTASIS) 0.05 % ophthalmic emulsion   Both Eyes   Place 1 drop into both eyes 2 (two) times daily.         Marland Kitchen escitalopram (LEXAPRO) 10 MG tablet   Oral   Take 5 mg by mouth daily.          Marland Kitchen ezetimibe (ZETIA) 10 MG tablet   Oral   Take 5 mg by mouth daily.          Marland Kitchen  OLANZapine (ZYPREXA) 2.5 MG tablet   Oral   Take 2.5 mg by mouth daily.         . pantoprazole (PROTONIX) 40 MG tablet   Oral   Take 40 mg by mouth daily.         Bertram Gala Glycol-Propyl Glycol (SYSTANE FREE OP)   Both Eyes   Place 1 drop into both eyes daily as needed. For dry eyes         . rosuvastatin (CRESTOR) 10 MG tablet   Oral   Take 10 mg by mouth daily.          BP 138/65  Pulse 67  Temp(Src) 98 F (36.7 C) (Oral)  Resp 18 Physical Exam  Nursing note and vitals reviewed. Constitutional: She is oriented to person, place, and time. She appears well-developed and well-nourished.  HENT:  Head: Normocephalic and atraumatic.  Eyes: EOM are normal. Pupils are equal, round, and reactive to light.  Neck: Normal range of motion.  Cardiovascular: Normal rate, regular rhythm and intact distal pulses.   Murmur heard. Pulmonary/Chest: Effort normal and breath sounds normal. No respiratory distress.  Abdominal: Soft. She exhibits no distension. There is tenderness (mild diffuse ttp).  No rebound, guarding or peritoneal signs  Musculoskeletal: Normal range of motion.  Neurological: She is alert and oriented to person, place, and time. No cranial nerve deficit. She exhibits normal muscle tone. Coordination normal.  Skin: Skin is warm and dry.  Psychiatric: She has a normal mood and affect. Her behavior is normal. Judgment and thought content normal.    ED Course  Procedures (including critical care time) Labs Review Labs Reviewed  CBC WITH DIFFERENTIAL - Abnormal; Notable for the following:    WBC 12.2 (*)    Neutrophils Relative % 79 (*)    Neutro Abs 9.6 (*)    Lymphocytes Relative 10 (*)    Monocytes Absolute 1.2 (*)    All other components within normal limits  COMPREHENSIVE METABOLIC PANEL - Abnormal; Notable for the following:    Potassium 2.9 (*)    Glucose, Bld 134 (*)    GFR calc non Af Amer 77 (*)    GFR calc Af Amer 89 (*)    All other components  within normal limits  URINALYSIS, ROUTINE W REFLEX MICROSCOPIC - Abnormal; Notable for the following:    Protein, ur 30 (*)    Leukocytes, UA MODERATE (*)    All other components  within normal limits  URINE MICROSCOPIC-ADD ON - Abnormal; Notable for the following:    Crystals CA OXALATE CRYSTALS (*)    All other components within normal limits  LIPASE, BLOOD   Imaging Review No results found.  EKG Interpretation   None       MDM   1. Abdominal pain     79 year old female with acute abdominal pain. Signs symptoms consistent with likely viral enteritis. Patient reports diarrhea yesterday has had waxing and waning crampy abdominal pain today consistent with enteritis. Benign abdominal exam. No rebound guarding or signs of peritonitis. Not acute abdomen. No elevated lipase. Exam and history not consistent with mesenteric ischemia. No right lower quadrant pain consistent with appendicitis. Patient symptomatically treated with Maalox, with moderate relief of symptoms. CBC did show slight leukocytosis of 12.2, with left shift. UA showed no UTI. No fevers no CVA tenderness not pyelonephritis. Given patient's age and multiple comorbidities, with no identified as a cause of abdominal pain CT abdomen and pelvis ordered to fully rule out serious intra-abdominal pathology. The patient received Benadryl and steroids as pretreatment for IV contrast. However after beginning this determination was made with patient and radiology attending for by mouth only contrast. No IV contrast to be used. Patient also noted to have hypokalemia, was given 40 meq for potassium repletion. Not likely related to abdominal pain. On reexam patient continues to have benign abdominal exam no concerning findings and now with only very mild abdominal pain. Maintaining with normal vitals no apparent distress nontoxic appearing tolerating by mouth. At 1 AM patient care transferred to Dr. Loretha Stapler.   Dispo: Pending CT.  Patient  discussed with attending Dr. Romeo Apple.      Bridgett Larsson, MD 12/29/12 [phone]

## 2012-12-28 NOTE — ED Notes (Signed)
Pt arrived from home via [facility] c/o Epigastric pain after eating some rice and pumpkin pie. Diarrhea yesterday. EMS VS BP 132/70, RR 20, o2 sat 96%, HR 64 HR, 12 Lead WNL. Allergy Demerol, Dilauded, IVP dye. Hx: AAA, HTN, TIA. Blind right eye

## 2012-12-29 ENCOUNTER — Emergency Department (HOSPITAL_COMMUNITY): Payer: Medicare Other

## 2012-12-29 DIAGNOSIS — R1013 Epigastric pain: Secondary | ICD-10-CM | POA: Diagnosis not present

## 2012-12-29 MED ORDER — DIPHENHYDRAMINE HCL 25 MG PO CAPS
25.0000 mg | ORAL_CAPSULE | Freq: Once | ORAL | Status: AC
  Administered 2012-12-29: 25 mg via ORAL
  Filled 2012-12-29: qty 1

## 2012-12-29 MED ORDER — HYDROCORTISONE SOD SUCCINATE 100 MG IJ SOLR
200.0000 mg | Freq: Once | INTRAMUSCULAR | Status: AC
  Administered 2012-12-29: 200 mg via INTRAVENOUS
  Filled 2012-12-29: qty 4

## 2012-12-29 NOTE — ED Notes (Signed)
The pt has finished her 1st cup of oral contrast.  Her 2nd cup is due at [facility]

## 2012-12-29 NOTE — ED Provider Notes (Signed)
Medical screening examination/treatment/procedure(s) were conducted as a shared visit with resident physician and myself.  I personally evaluated the patient during the encounter.   EKG Interpretation    Date/Time:  Wednesday December 28 2012 19:46:05 EST Ventricular Rate:  63 PR Interval:  195 QRS Duration: 86 QT Interval:  430 QTC Calculation: 440 R Axis:   70 Text Interpretation:  Sinus rhythm Baseline wander in lead(s) II No significant change since last tracing Confirmed by Denim Kalmbach  MD, Arianah Torgeson ([id number]) on 12/29/2012 4:26:26 PM            I interviewed and examined the patient. Lungs are CTAB. Cardiac exam wnl. Abdomen soft.  Pt's abd pain is improving spontaneously. CT ordered. Pt checked out to Dr. Loretha Stapler while awaiting CT.   Junius Argyle, MD 12/29/12 [phone]

## 2012-12-29 NOTE — ED Notes (Signed)
Pt sleepy.  More med given

## 2012-12-29 NOTE — ED Notes (Signed)
MD at bedside. 

## 2012-12-29 NOTE — ED Notes (Signed)
i woke the pt to   Let her finish her oral contrast

## 2012-12-29 NOTE — ED Provider Notes (Signed)
There is from Dr. Romeo Apple. CT scan is negative for acute process. Repeat abdominal exam is reassuring. Abdomen soft, mild tenderness, no rigidity, rebound, guarding. Patient is in general well appearing. Abdominal pain has improved. Advise close PCP followup and have given return precautions.  Clinical Impression: 1. Abdominal pain       Candyce Churn, MD 12/29/12 [phone]

## 2013-01-04 DIAGNOSIS — E876 Hypokalemia: Secondary | ICD-10-CM | POA: Diagnosis not present

## 2013-01-04 DIAGNOSIS — R1013 Epigastric pain: Secondary | ICD-10-CM | POA: Diagnosis not present

## 2013-01-05 DIAGNOSIS — R1013 Epigastric pain: Secondary | ICD-10-CM | POA: Diagnosis not present

## 2013-01-10 ENCOUNTER — Ambulatory Visit: Payer: No Typology Code available for payment source | Admitting: Sports Medicine

## 2013-01-18 DIAGNOSIS — H3410 Central retinal artery occlusion, unspecified eye: Secondary | ICD-10-CM | POA: Diagnosis not present

## 2013-01-18 DIAGNOSIS — H35379 Puckering of macula, unspecified eye: Secondary | ICD-10-CM | POA: Diagnosis not present

## 2013-01-18 DIAGNOSIS — H211X9 Other vascular disorders of iris and ciliary body, unspecified eye: Secondary | ICD-10-CM | POA: Diagnosis not present

## 2013-01-24 DIAGNOSIS — I10 Essential (primary) hypertension: Secondary | ICD-10-CM | POA: Diagnosis not present

## 2013-01-24 DIAGNOSIS — R1084 Generalized abdominal pain: Secondary | ICD-10-CM | POA: Diagnosis not present

## 2013-02-27 DIAGNOSIS — R1013 Epigastric pain: Secondary | ICD-10-CM | POA: Diagnosis not present

## 2013-03-13 ENCOUNTER — Ambulatory Visit: Payer: Medicare Other | Admitting: Neurology

## 2013-04-06 DIAGNOSIS — H6690 Otitis media, unspecified, unspecified ear: Secondary | ICD-10-CM | POA: Diagnosis not present

## 2013-05-15 DIAGNOSIS — H5319 Other subjective visual disturbances: Secondary | ICD-10-CM | POA: Diagnosis not present

## 2013-05-19 DIAGNOSIS — T148XXA Other injury of unspecified body region, initial encounter: Secondary | ICD-10-CM | POA: Diagnosis not present

## 2013-05-23 DIAGNOSIS — Z0000 Encounter for general adult medical examination without abnormal findings: Secondary | ICD-10-CM | POA: Diagnosis not present

## 2013-05-23 DIAGNOSIS — N390 Urinary tract infection, site not specified: Secondary | ICD-10-CM | POA: Diagnosis not present

## 2013-05-23 DIAGNOSIS — K219 Gastro-esophageal reflux disease without esophagitis: Secondary | ICD-10-CM | POA: Diagnosis not present

## 2013-05-23 DIAGNOSIS — E7800 Pure hypercholesterolemia, unspecified: Secondary | ICD-10-CM | POA: Diagnosis not present

## 2013-05-23 DIAGNOSIS — I10 Essential (primary) hypertension: Secondary | ICD-10-CM | POA: Diagnosis not present

## 2013-05-29 DIAGNOSIS — J438 Other emphysema: Secondary | ICD-10-CM | POA: Diagnosis not present

## 2013-05-29 DIAGNOSIS — E7800 Pure hypercholesterolemia, unspecified: Secondary | ICD-10-CM | POA: Diagnosis not present

## 2013-05-29 DIAGNOSIS — M899 Disorder of bone, unspecified: Secondary | ICD-10-CM | POA: Diagnosis not present

## 2013-05-29 DIAGNOSIS — I10 Essential (primary) hypertension: Secondary | ICD-10-CM | POA: Diagnosis not present

## 2013-05-29 DIAGNOSIS — M949 Disorder of cartilage, unspecified: Secondary | ICD-10-CM | POA: Diagnosis not present

## 2013-05-29 DIAGNOSIS — K649 Unspecified hemorrhoids: Secondary | ICD-10-CM | POA: Diagnosis not present

## 2013-05-29 DIAGNOSIS — M810 Age-related osteoporosis without current pathological fracture: Secondary | ICD-10-CM | POA: Diagnosis not present

## 2013-06-13 DIAGNOSIS — H35379 Puckering of macula, unspecified eye: Secondary | ICD-10-CM | POA: Diagnosis not present

## 2013-06-13 DIAGNOSIS — H3410 Central retinal artery occlusion, unspecified eye: Secondary | ICD-10-CM | POA: Diagnosis not present

## 2013-06-15 DIAGNOSIS — M810 Age-related osteoporosis without current pathological fracture: Secondary | ICD-10-CM | POA: Diagnosis not present

## 2013-06-22 ENCOUNTER — Encounter (INDEPENDENT_AMBULATORY_CARE_PROVIDER_SITE_OTHER): Payer: Self-pay | Admitting: General Surgery

## 2013-06-22 ENCOUNTER — Ambulatory Visit (INDEPENDENT_AMBULATORY_CARE_PROVIDER_SITE_OTHER): Payer: Medicare Other | Admitting: General Surgery

## 2013-06-22 VITALS — BP 122/72 | HR 78 | Temp 97.2°F | Resp 16 | Ht 62.0 in | Wt 136.2 lb

## 2013-06-22 DIAGNOSIS — K648 Other hemorrhoids: Secondary | ICD-10-CM

## 2013-06-22 DIAGNOSIS — K649 Unspecified hemorrhoids: Secondary | ICD-10-CM

## 2013-06-22 NOTE — Progress Notes (Signed)
Chief complaint: Painful hemorrhoids with prolapse  History: Patient returns to the office. I know her from a history of cholecystectomy. I've also evaluated her in 2004 hemorrhoidal symptoms. We had actually planned a PPH procedure for prolapsed hemorrhoids but she had to cancel this due to  her husband's illness and did not reschedule.  She has essentially had ongoing identical symptoms possibly slightly worse. With bowel movements she has prolapse of tissue and swelling which he often has to manually reduce and is very uncomfortable. When sitting for a long period of time she will get pressure and urgency. She does not have any blood that she has noticed. As far as her bowel habits she does have some increased urgency which she feels may be related to her hemorrhoids. She has not had any previous procedures. Her last colonoscopy was over 10 years ago.  Past Medical History  Diagnosis Date  . HTN (hypertension)   . Aortic aneurysm   . Transient ischemic attack   . Murmur, cardiac   . Cholelithiasis   . Internal hemorrhoids   . Irritable bowel syndrome   . Depression   . Eustachian tube dysfunction   . Appendicitis   . CVA (cerebral infarction)     Right retinal branch occlusion   Past Surgical History  Procedure Laterality Date  . Abdominal aortic aneurysm repair  2005  . Kidney stones removed    . Appendectomy    . Abdominal hysterectomy    . Cholecystectomy    . Shoulder surgery      Left  . Wrist surgery      Right  . Tee without cardioversion  12/18/2011    Procedure: TRANSESOPHAGEAL ECHOCARDIOGRAM (TEE);  Surgeon: Larey Dresser, MD;  Location: [facility] ENDOSCOPY;  Service: Cardiovascular;  Laterality: N/A;   Current Outpatient Prescriptions  Medication Sig Dispense Refill  . amLODipine (NORVASC) 5 MG tablet Take 5 mg by mouth daily.      . Cholecalciferol (VITAMIN D PO) Take 1 tablet by mouth daily.      . clopidogrel (PLAVIX) 75 MG tablet Take 1 tablet by mouth daily.      .  Coenzyme Q10 (CO Q-10) 50 MG CAPS Take 1 capsule by mouth daily.      . cycloSPORINE (RESTASIS) 0.05 % ophthalmic emulsion Place 1 drop into both eyes 2 (two) times daily.      Marland Kitchen escitalopram (LEXAPRO) 10 MG tablet Take 5 mg by mouth daily.       Marland Kitchen ezetimibe (ZETIA) 10 MG tablet Take 5 mg by mouth daily.       Marland Kitchen OLANZapine (ZYPREXA) 2.5 MG tablet Take 2.5 mg by mouth daily.      . pantoprazole (PROTONIX) 40 MG tablet Take 40 mg by mouth daily.      Vladimir Faster Glycol-Propyl Glycol (SYSTANE FREE OP) Place 1 drop into both eyes daily as needed. For dry eyes      . rosuvastatin (CRESTOR) 10 MG tablet Take 10 mg by mouth daily.       No current facility-administered medications for this visit.   Allergies  Allergen Reactions  . Shellfish Allergy Shortness Of Breath and Rash    All seafood  . Xylocaine [Lidocaine] Shortness Of Breath  . Ampicillin     unknown  . Biaxin [Clarithromycin]     unknown  . Celebrex [Celecoxib]     Unknown   . Cozaar [Losartan]     High blood pressure  . Demerol [Meperidine]  unknown  . Elavil [Amitriptyline]     unknown  . Fosamax [Alendronate]     unknown  . Hydromorphone     Hives   . Inderal [Propranolol]     unknown  . Ivp Dye [Iodinated Diagnostic Agents]     rash  . Levaquin [Levofloxacin]     unknown  . Macrodantin [Nitrofurantoin]     unknown  . Mycinette [Phenol]     Unknown   . Neggram [Nalidixic Acid]     unknown  . Novocain [Procaine]     unknown  . Pamelor [Nortriptyline]     unknown  . Paxil [Paroxetine]     unknown  . Penicillins     rash  . Robaxin [Methocarbamol]     unknown  . Tamiflu [Oseltamivir]     Unknown   . Tenormin [Atenolol]     unknown  . Triavil [Perphenazine-Amitriptyline]     Unknown   . Wellbutrin [Bupropion]     unknown  . Zoloft [Sertraline]     Unknown    History  Substance Use Topics  . Smoking status: Current Every Day Smoker -- 0.50 packs/day for 40 years    Types: Cigarettes  .  Smokeless tobacco: Never Used  . Alcohol Use: No   Exam: BP 122/72  Pulse 78  Temp(Src) 97.2 F (36.2 C) (Temporal)  Resp 16  Ht 5\' 2"  (1.575 m)  Wt 136 lb 3.2 oz (61.78 kg)  BMI 24.91 kg/m2 General: Elderly Caucasian female in no distress Skin: No rash or infection Lungs: Clear equal breath sounds bilaterally Cardiac: Regular rate and rhythm. No edema Abdomen: Multiple well-healed incisions. No apparent hernias. Soft and nontender. Rectal: External rectal exam shows one small external skin tag. On digital exam there is mild tenderness and no mass. Normal rectal tone. Extremities: No edema Neurologic: Alert and oriented. Gait normal  Anoscopy was performed. This shows some moderate circumferential internal hemorrhoids without friability  Assessment and plan: Internal hemorrhoids, symptomatic with prolapse. I think still that she would be a good candidate for PPH. She does have some slight change in bowel habits and colonoscopy is due. She has seen Dr. Amedeo Plenty in the past and we will ask for a preoperative colonoscopy. Assuming this is okay to go ahead and schedule for PPH. We discussed the procedure in detail including its indications and recovery and potential complications. She was given literature guarding the procedure. I think it is likely to improve her symptoms. We discussed risks of general anesthesia, bleeding, infection and slight risks of incontinence, recurrent symptoms and rare risk of rectovaginal fistula. All her questions were answered. I will await results of her colonoscopy and then call her and schedule her she desires to proceed.

## 2013-06-28 ENCOUNTER — Ambulatory Visit (INDEPENDENT_AMBULATORY_CARE_PROVIDER_SITE_OTHER): Payer: Medicare Other | Admitting: Cardiology

## 2013-06-28 ENCOUNTER — Encounter: Payer: Self-pay | Admitting: Cardiology

## 2013-06-28 VITALS — BP 136/65 | HR 69 | Ht 61.5 in | Wt 135.0 lb

## 2013-06-28 DIAGNOSIS — E785 Hyperlipidemia, unspecified: Secondary | ICD-10-CM | POA: Diagnosis not present

## 2013-06-28 DIAGNOSIS — I6529 Occlusion and stenosis of unspecified carotid artery: Secondary | ICD-10-CM | POA: Diagnosis not present

## 2013-06-28 DIAGNOSIS — I10 Essential (primary) hypertension: Secondary | ICD-10-CM

## 2013-06-28 NOTE — Patient Instructions (Signed)

## 2013-06-28 NOTE — Progress Notes (Signed)
HPI The patient presents for evaluation after a stroke secondary to emboli to the right retinal artery. Her work up included carotid Doppler which demonstrated nonobstructive disease.  She did have an echocardiogram which demonstrated LVH with an EF of 60% and moderate tricuspid regurgitation. She did have a CT angiogram which demonstrated moderate distal vertebral artery stenosis. There was no arrhythmia noted.  TEE scheduled after the last appointment demonstrated normal LV size with moderate LV hypertrophy. EF 55%.  She had no LAA thrombus. There was a small PFO with bubbles crossing at rest. There was extensive aortic atherosclerosis noted from the arch into the descending thoracic aorta.     Since I last saw her she has done well.  The patient denies any new symptoms such as chest discomfort, neck or arm discomfort. There has been no new shortness of breath, PND or orthopnea. There have been no reported palpitations, presyncope or syncope.  She is slightly limited by back pain.    Allergies  Allergen Reactions  . Shellfish Allergy Shortness Of Breath and Rash    All seafood  . Xylocaine [Lidocaine] Shortness Of Breath  . Ampicillin     unknown  . Biaxin [Clarithromycin]     unknown  . Celebrex [Celecoxib]     Unknown   . Cozaar [Losartan]     High blood pressure  . Demerol [Meperidine]     unknown  . Elavil [Amitriptyline]     unknown  . Fosamax [Alendronate]     unknown  . Hydromorphone     Hives   . Inderal [Propranolol]     unknown  . Ivp Dye [Iodinated Diagnostic Agents]     rash  . Levaquin [Levofloxacin]     unknown  . Macrodantin [Nitrofurantoin]     unknown  . Mycinette [Phenol]     Unknown   . Neggram [Nalidixic Acid]     unknown  . Novocain [Procaine]     unknown  . Pamelor [Nortriptyline]     unknown  . Paxil [Paroxetine]     unknown  . Penicillins     rash  . Robaxin [Methocarbamol]     unknown  . Tamiflu [Oseltamivir]     Unknown   . Tenormin  [Atenolol]     unknown  . Triavil [Perphenazine-Amitriptyline]     Unknown   . Wellbutrin [Bupropion]     unknown  . Zoloft [Sertraline]     Unknown     Current Outpatient Prescriptions  Medication Sig Dispense Refill  . amLODipine (NORVASC) 5 MG tablet Take 5 mg by mouth daily.      . Cholecalciferol (VITAMIN D PO) Take 1 tablet by mouth daily.      . clopidogrel (PLAVIX) 75 MG tablet Take 1 tablet by mouth daily.      . Coenzyme Q10 (CO Q-10) 50 MG CAPS Take 1 capsule by mouth daily.      . cycloSPORINE (RESTASIS) 0.05 % ophthalmic emulsion Place 1 drop into both eyes 2 (two) times daily.      Marland Kitchen escitalopram (LEXAPRO) 10 MG tablet Take 5 mg by mouth daily.       Marland Kitchen ezetimibe (ZETIA) 10 MG tablet Take 5 mg by mouth daily.       Marland Kitchen OLANZapine (ZYPREXA) 2.5 MG tablet Take 2.5 mg by mouth daily.      . pantoprazole (PROTONIX) 40 MG tablet Take 40 mg by mouth daily.      Vladimir Faster Glycol-Propyl Glycol (SYSTANE FREE  OP) Place 1 drop into both eyes daily as needed. For dry eyes      . rosuvastatin (CRESTOR) 10 MG tablet Take 10 mg by mouth daily.       No current facility-administered medications for this visit.    Past Medical History  Diagnosis Date  . HTN (hypertension)   . Aortic aneurysm   . Transient ischemic attack   . Murmur, cardiac   . Cholelithiasis   . Internal hemorrhoids   . Irritable bowel syndrome   . Depression   . Eustachian tube dysfunction   . Appendicitis   . CVA (cerebral infarction)     Right retinal branch occlusion    Past Surgical History  Procedure Laterality Date  . Abdominal aortic aneurysm repair  2005  . Kidney stones removed    . Appendectomy    . Abdominal hysterectomy    . Cholecystectomy    . Shoulder surgery      Left  . Wrist surgery      Right  . Tee without cardioversion  12/18/2011    Procedure: TRANSESOPHAGEAL ECHOCARDIOGRAM (TEE);  Surgeon: Larey Dresser, MD;  Location: [facility] ENDOSCOPY;  Service: Cardiovascular;  Laterality:  N/A;    ROS:  Positive for joint pains. Otherwise as stated in the history of present illness and negative for all other systems.  PHYSICAL EXAM BP 136/65  Pulse 69  Ht 5' 1.5" (1.562 m)  Wt 135 lb (61.236 kg)  BMI 25.10 kg/m2 GENERAL:  Well appearing HEENT:  Pupils equal round and reactive, fundi not visualized, oral mucosa unremarkable, dentures NECK:  No jugular venous distention, waveform within normal limits, carotid upstroke brisk and symmetric, positive bilateral bruits, no thyromegaly LYMPHATICS:  No cervical, inguinal adenopathy LUNGS:  Clear to auscultation bilaterally CHEST:  Unremarkable HEART:  PMI not displaced or sustained,S1 and S2 within normal limits, no S3, no S4, no clicks, no rubs, 3/6 holosystolic apical and left sternal border murmur ABD:  Flat, positive bowel sounds normal in frequency in pitch, positive bruits, no rebound, no guarding, no midline pulsatile mass, no hepatomegaly, no splenomegaly EXT:  2 plus pulses throughout, no edema, no cyanosis no clubbing SKIN:  No rashes no nodules   EKG:  Sinus rhythm, rate 62, axis within normal limits, intervals within normal limits, in no acute ST-T wave changes. 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  She understands the importance of lipid control and the importance of stopping smoking though she's unable to do this completely.  No further work up is indicated.   PVD - We are working on risk reduction.  She will have carotid Doppler to follow up on moderate stenosis.   Tobacco abuse - We discussed this. She doesn't think she would at this time.    Dyslipidemia - This is followed by Dr. Shelia Media.  She will remain on Crestor.

## 2013-06-29 ENCOUNTER — Telehealth (INDEPENDENT_AMBULATORY_CARE_PROVIDER_SITE_OTHER): Payer: Self-pay

## 2013-06-29 NOTE — Telephone Encounter (Signed)
Pt was seen in the office on 06/22/13 for Internal hemorrhoids. Dr Excell Seltzer has requested that the pt see Dr Amedeo Plenty for a colonoscopy prior to surgery. Pt was calling to see if we had made this appt for her at this time. Spoke with Robeline and she states that she would check into this for the pt and we would get back in contact with the pt. Advised pt of these and informed that if she would also like to call Dr Doristine Locks office that she could do so as well. Pt verbalized understanding.

## 2013-07-11 ENCOUNTER — Ambulatory Visit (HOSPITAL_COMMUNITY): Payer: Medicare Other | Attending: Cardiology | Admitting: Cardiology

## 2013-07-11 DIAGNOSIS — I10 Essential (primary) hypertension: Secondary | ICD-10-CM | POA: Insufficient documentation

## 2013-07-11 DIAGNOSIS — E785 Hyperlipidemia, unspecified: Secondary | ICD-10-CM | POA: Diagnosis not present

## 2013-07-11 DIAGNOSIS — I658 Occlusion and stenosis of other precerebral arteries: Secondary | ICD-10-CM | POA: Insufficient documentation

## 2013-07-11 DIAGNOSIS — Z8673 Personal history of transient ischemic attack (TIA), and cerebral infarction without residual deficits: Secondary | ICD-10-CM | POA: Diagnosis not present

## 2013-07-11 DIAGNOSIS — I6529 Occlusion and stenosis of unspecified carotid artery: Secondary | ICD-10-CM | POA: Diagnosis not present

## 2013-07-11 DIAGNOSIS — Z87891 Personal history of nicotine dependence: Secondary | ICD-10-CM | POA: Diagnosis not present

## 2013-07-11 NOTE — Progress Notes (Signed)
Carotid duplex completed 

## 2013-07-25 ENCOUNTER — Telehealth (INDEPENDENT_AMBULATORY_CARE_PROVIDER_SITE_OTHER): Payer: Self-pay | Admitting: General Surgery

## 2013-07-25 NOTE — Telephone Encounter (Signed)
Call the patient and discussed results of evaluation by Dr. Amedeo Plenty, GI, who did not feel that a colonoscopy is currently indicated. We discussed the possibility of proceeding with PPH. She had a number of questions about the procedure which I answered. At this point she is not sure that she wants to proceed. I told her that it is not an emergency and is done only to improve her symptoms and in that regard the decision is up to her. She will think about it and let us know.

## 2013-07-26 DIAGNOSIS — Z1211 Encounter for screening for malignant neoplasm of colon: Secondary | ICD-10-CM | POA: Diagnosis not present

## 2013-08-28 DIAGNOSIS — H3410 Central retinal artery occlusion, unspecified eye: Secondary | ICD-10-CM | POA: Diagnosis not present

## 2013-08-28 DIAGNOSIS — H53039 Strabismic amblyopia, unspecified eye: Secondary | ICD-10-CM | POA: Diagnosis not present

## 2013-08-28 DIAGNOSIS — H2510 Age-related nuclear cataract, unspecified eye: Secondary | ICD-10-CM | POA: Diagnosis not present

## 2013-09-27 DIAGNOSIS — K649 Unspecified hemorrhoids: Secondary | ICD-10-CM | POA: Diagnosis not present

## 2013-09-27 DIAGNOSIS — F329 Major depressive disorder, single episode, unspecified: Secondary | ICD-10-CM | POA: Diagnosis not present

## 2013-09-27 DIAGNOSIS — I10 Essential (primary) hypertension: Secondary | ICD-10-CM | POA: Diagnosis not present

## 2013-09-27 DIAGNOSIS — F3289 Other specified depressive episodes: Secondary | ICD-10-CM | POA: Diagnosis not present

## 2013-10-11 DIAGNOSIS — H3410 Central retinal artery occlusion, unspecified eye: Secondary | ICD-10-CM | POA: Diagnosis not present

## 2013-10-11 DIAGNOSIS — H3581 Retinal edema: Secondary | ICD-10-CM | POA: Diagnosis not present

## 2013-11-13 DIAGNOSIS — R350 Frequency of micturition: Secondary | ICD-10-CM | POA: Diagnosis not present

## 2013-11-13 DIAGNOSIS — R42 Dizziness and giddiness: Secondary | ICD-10-CM | POA: Diagnosis not present

## 2013-11-13 DIAGNOSIS — R531 Weakness: Secondary | ICD-10-CM | POA: Diagnosis not present

## 2013-11-15 DIAGNOSIS — Z23 Encounter for immunization: Secondary | ICD-10-CM | POA: Diagnosis not present

## 2013-11-15 DIAGNOSIS — N390 Urinary tract infection, site not specified: Secondary | ICD-10-CM | POA: Diagnosis not present

## 2013-11-15 DIAGNOSIS — R42 Dizziness and giddiness: Secondary | ICD-10-CM | POA: Diagnosis not present

## 2013-11-16 DIAGNOSIS — H3411 Central retinal artery occlusion, right eye: Secondary | ICD-10-CM | POA: Diagnosis not present

## 2013-11-16 DIAGNOSIS — H3581 Retinal edema: Secondary | ICD-10-CM | POA: Diagnosis not present

## 2013-12-12 ENCOUNTER — Other Ambulatory Visit (INDEPENDENT_AMBULATORY_CARE_PROVIDER_SITE_OTHER): Payer: Self-pay | Admitting: General Surgery

## 2013-12-12 DIAGNOSIS — K648 Other hemorrhoids: Secondary | ICD-10-CM | POA: Diagnosis not present

## 2014-01-29 ENCOUNTER — Encounter (HOSPITAL_COMMUNITY)
Admission: RE | Admit: 2014-01-29 | Discharge: 2014-01-29 | Disposition: A | Discharge status: Home / Self Care | Payer: Medicare Other | Source: Ambulatory Visit | Attending: General Surgery | Admitting: General Surgery

## 2014-01-29 ENCOUNTER — Ambulatory Visit (HOSPITAL_COMMUNITY)
Admission: RE | Admit: 2014-01-29 | Discharge: 2014-01-29 | Disposition: A | Discharge status: Home / Self Care | Payer: Medicare Other | Source: Ambulatory Visit | Attending: Anesthesiology | Admitting: Anesthesiology

## 2014-01-29 ENCOUNTER — Encounter (HOSPITAL_COMMUNITY): Payer: Self-pay

## 2014-01-29 DIAGNOSIS — I700 Atherosclerosis of aorta: Secondary | ICD-10-CM | POA: Insufficient documentation

## 2014-01-29 DIAGNOSIS — Z01818 Encounter for other preprocedural examination: Secondary | ICD-10-CM | POA: Insufficient documentation

## 2014-01-29 DIAGNOSIS — I2510 Atherosclerotic heart disease of native coronary artery without angina pectoris: Secondary | ICD-10-CM | POA: Insufficient documentation

## 2014-01-29 DIAGNOSIS — I10 Essential (primary) hypertension: Secondary | ICD-10-CM

## 2014-01-29 DIAGNOSIS — K649 Unspecified hemorrhoids: Secondary | ICD-10-CM | POA: Insufficient documentation

## 2014-01-29 DIAGNOSIS — I719 Aortic aneurysm of unspecified site, without rupture: Secondary | ICD-10-CM | POA: Insufficient documentation

## 2014-01-29 DIAGNOSIS — M419 Scoliosis, unspecified: Secondary | ICD-10-CM | POA: Diagnosis not present

## 2014-01-29 HISTORY — DX: Chronic kidney disease, unspecified: N18.9

## 2014-01-29 HISTORY — DX: Legal blindness, as defined in USA: H54.8

## 2014-01-29 HISTORY — DX: Cerebral infarction, unspecified: I63.9

## 2014-01-29 HISTORY — DX: Gastro-esophageal reflux disease without esophagitis: K21.9

## 2014-01-29 HISTORY — DX: Anxiety disorder, unspecified: F41.9

## 2014-01-29 HISTORY — DX: Occlusion and stenosis of unspecified carotid artery: I65.29

## 2014-01-29 LAB — CBC
HCT: 45.4 % (ref 36.0–46.0)
Hemoglobin: 14.7 g/dL (ref 12.0–15.0)
MCH: 29.5 pg (ref 26.0–34.0)
MCHC: 32.4 g/dL (ref 30.0–36.0)
MCV: 91 fL (ref 78.0–100.0)
Platelets: 201 10*3/uL (ref 150–400)
RBC: 4.99 MIL/uL (ref 3.87–5.11)
RDW: 13.5 % (ref 11.5–15.5)
WBC: 8.2 10*3/uL (ref 4.0–10.5)

## 2014-01-29 LAB — BASIC METABOLIC PANEL
Anion gap: 6 (ref 5–15)
BUN: 10 mg/dL (ref 6–23)
CO2: 29 mmol/L (ref 19–32)
CREATININE: 0.87 mg/dL (ref 0.50–1.10)
Calcium: 9.7 mg/dL (ref 8.4–10.5)
Chloride: 106 mEq/L (ref 96–112)
GFR calc non Af Amer: 59 mL/min — ABNORMAL LOW (ref >90)
GFR, EST AFRICAN AMERICAN: 68 mL/min — AB (ref >90)
Glucose, Bld: 86 mg/dL (ref 70–99)
POTASSIUM: 3.8 mmol/L (ref 3.5–5.1)
Sodium: 141 mmol/L (ref 135–145)

## 2014-01-29 NOTE — Patient Instructions (Signed)
20 Allison [name]  01/29/2014   Your procedure is scheduled on:      Monday February 05, 2014  Report to [facility] Main Entrance and follow signs to  [place] arrive at 0800 AM.   Call this number if you have problems the morning of surgery [phone] or Presurgical Testing [phone].   Remember:  Do not eat food or drink liquids :After Midnight.  For Living Will and/or Health Care Power Attorney Forms: please provide copy for your medical record, may bring AM of surgery (forms should be already notarized-we do not provide this service).  Remember: follow any bowel prep instructions per MD office.      Take these medicines the morning of surgery with A SIP OF WATER: Amlodipine;eye drops as needed (Restasis and Systane);Escitalopram (Lexapro);Pantoprazole (Protonix)                               You may not have any metal on your body including hair pins and piercings  Do not wear jewelry, make-up, lotions, powders,perfumes or deodorant.  Do not shave body hair  48 hours(2 days) of CHG soap use.                Do not bring valuables to the hospital. [place].  Contacts, dentures or bridgework may not be worn into surgery.    Patients discharged the day of surgery will not be allowed to drive home.  Name and phone number of your driver:James T Mansour (husband)      CLEAR LIQUID DIET   Foods Allowed                                                                     Foods Excluded  Coffee and tea, regular and decaf                             liquids that you cannot  Plain Jell-O in any flavor                                             see through such as: Fruit ices (not with fruit pulp)                                     milk, soups, orange juice  Iced Popsicles                                    All solid food Carbonated beverages, regular and diet                                    Cranberry, grape and apple  juices Sports drinks like Gatorade Lightly seasoned clear broth or consume(fat free) Sugar, honey syrup  Sample  Menu Breakfast                                Lunch                                     Supper Cranberry juice                    Beef broth                            Chicken broth Jell-O                                     Grape juice                           Apple juice Coffee or tea                        Jell-O                                      Popsicle                                                Coffee or tea                        Coffee or tea  _____________________________________________________________________  ________________________________________________________________________  [facility] - Preparing for Surgery Before surgery, you can play an important role.  Because skin is not sterile, your skin needs to be as free of germs as possible.  You can reduce the number of germs on your skin by washing with CHG (chlorahexidine gluconate) soap before surgery.  CHG is an antiseptic cleaner which kills germs and bonds with the skin to continue killing germs even after washing. Please DO NOT use if you have an allergy to CHG or antibacterial soaps.  If your skin becomes reddened/irritated stop using the CHG and inform your nurse when you arrive at Short Stay. Do not shave (including legs and underarms) for at least 48 hours prior to the first CHG shower.  You may shave your face/neck. Please follow these instructions carefully:  1.  Shower with CHG Soap the night before surgery and the  morning of Surgery.  2.  If you choose to wash your hair, wash your hair first as usual with your  normal  shampoo.  3.  After you shampoo, rinse your hair and body thoroughly to remove the  shampoo.                           4.  Use CHG as you would any other liquid soap.  You can apply chg directly  to the skin and wash                       Gently with  a scrungie or clean  washcloth.  5.  Apply the CHG Soap to your body ONLY FROM THE NECK DOWN.   Do not use on face/ open                           Wound or open sores. Avoid contact with eyes, ears mouth and genitals (private parts).                       Wash face,  Genitals (private parts) with your normal soap.             6.  Wash thoroughly, paying special attention to the area where your surgery  will be performed.  7.  Thoroughly rinse your body with warm water from the neck down.  8.  DO NOT shower/wash with your normal soap after using and rinsing off  the CHG Soap.                9.  Pat yourself dry with a clean towel.            10.  Wear clean pajamas.            11.  Place clean sheets on your bed the night of your first shower and do not  sleep with pets. Day of Surgery : Do not apply any lotions/deodorants the morning of surgery.  Please wear clean clothes to the hospital/surgery center.  FAILURE TO FOLLOW THESE INSTRUCTIONS MAY RESULT IN THE CANCELLATION OF YOUR SURGERY PATIENT SIGNATURE_________________________________  NURSE SIGNATURE__________________________________  ________________________________________________________________________

## 2014-01-29 NOTE — Progress Notes (Signed)
EKG epic 06/28/2013 Carotid Duplex 07/11/2013 epic  LOV Dr Percival Spanish 06/28/2013 epic  Neurologist Dr Rexene Alberts epic [place] 11/14/2012

## 2014-02-05 ENCOUNTER — Ambulatory Visit (HOSPITAL_COMMUNITY)
Admission: RE | Admit: 2014-02-05 | Discharge: 2014-02-05 | Disposition: A | Discharge status: Home / Self Care | Payer: Medicare Other | Source: Ambulatory Visit | Attending: General Surgery | Admitting: General Surgery

## 2014-02-05 ENCOUNTER — Ambulatory Visit (HOSPITAL_COMMUNITY): Payer: Medicare Other | Admitting: Registered Nurse

## 2014-02-05 ENCOUNTER — Encounter (HOSPITAL_COMMUNITY): Payer: Self-pay

## 2014-02-05 ENCOUNTER — Encounter (HOSPITAL_COMMUNITY)
Admission: RE | Disposition: A | Discharge status: Home / Self Care | Payer: Self-pay | Source: Ambulatory Visit | Attending: General Surgery

## 2014-02-05 DIAGNOSIS — Z5309 Procedure and treatment not carried out because of other contraindication: Secondary | ICD-10-CM | POA: Diagnosis not present

## 2014-02-05 DIAGNOSIS — K648 Other hemorrhoids: Secondary | ICD-10-CM | POA: Insufficient documentation

## 2014-02-05 SURGERY — HEMORRHOIDECTOMY PROLAPSED
Anesthesia: General

## 2014-02-05 MED ORDER — FLEET ENEMA 7-19 GM/118ML RE ENEM: 1.0000 | ENEMA | Freq: Once | RECTAL | Status: DC

## 2014-02-05 MED ORDER — FENTANYL CITRATE 0.05 MG/ML IJ SOLN
INTRAMUSCULAR | Status: AC
  Filled 2014-02-05: qty 2

## 2014-02-05 MED ORDER — CIPROFLOXACIN IN D5W 400 MG/200ML IV SOLN: 400.0000 mg | INTRAVENOUS | Status: DC

## 2014-02-05 MED ORDER — ONDANSETRON HCL 4 MG/2ML IJ SOLN
INTRAMUSCULAR | Status: AC
  Filled 2014-02-05: qty 2

## 2014-02-05 MED ORDER — PROPOFOL 10 MG/ML IV BOLUS
INTRAVENOUS | Status: AC
  Filled 2014-02-05: qty 20

## 2014-02-05 NOTE — Progress Notes (Signed)
Called in to Dr Excell Seltzer to make him aware that patient took Plavix yesterday. Stated she wasn't ever told to stop it.  When questioned again. States she didn't taken it yesterday,  but did take it Saturday 02-03-14

## 2014-02-05 NOTE — Anesthesia Preprocedure Evaluation (Deleted)
Anesthesia Evaluation  Patient identified by MRN, date of birth, ID band Patient awake    Reviewed: Allergy & Precautions, NPO status , Patient's Chart, lab work & pertinent test results  Airway        Dental   Pulmonary neg pulmonary ROS, Current Smoker,          Cardiovascular hypertension, Pt. on medications + Peripheral Vascular Disease     Neuro/Psych PSYCHIATRIC DISORDERS Anxiety Depression CVA    GI/Hepatic Neg liver ROS, PUD, GERD-  ,  Endo/Other  negative endocrine ROS  Renal/GU Renal diseasenegative Renal ROS  negative genitourinary   Musculoskeletal negative musculoskeletal ROS (+)   Abdominal   Peds negative pediatric ROS (+)  Hematology negative hematology ROS (+)   Anesthesia Other Findings   Reproductive/Obstetrics negative OB ROS                             Anesthesia Physical Anesthesia Plan  ASA: III  Anesthesia Plan: General   Post-op Pain Management:    Induction: Intravenous  Airway Management Planned: LMA  Additional Equipment:   Intra-op Plan:   Post-operative Plan: Extubation in OR  Informed Consent: I have reviewed the patients History and Physical, chart, labs and discussed the procedure including the risks, benefits and alternatives for the proposed anesthesia with the patient or authorized representative who has indicated his/her understanding and acceptance.   Dental advisory given  Plan Discussed with: CRNA  Anesthesia Plan Comments:         Anesthesia Quick Evaluation

## 2014-02-15 ENCOUNTER — Encounter (HOSPITAL_COMMUNITY): Payer: Self-pay | Admitting: General Surgery

## 2014-02-27 DIAGNOSIS — E780 Pure hypercholesterolemia: Secondary | ICD-10-CM | POA: Diagnosis not present

## 2014-03-01 DIAGNOSIS — F339 Major depressive disorder, recurrent, unspecified: Secondary | ICD-10-CM | POA: Diagnosis not present

## 2014-03-01 DIAGNOSIS — I10 Essential (primary) hypertension: Secondary | ICD-10-CM | POA: Diagnosis not present

## 2014-03-01 DIAGNOSIS — E780 Pure hypercholesterolemia: Secondary | ICD-10-CM | POA: Diagnosis not present

## 2014-04-11 DIAGNOSIS — H3581 Retinal edema: Secondary | ICD-10-CM | POA: Diagnosis not present

## 2014-04-11 DIAGNOSIS — H3411 Central retinal artery occlusion, right eye: Secondary | ICD-10-CM | POA: Diagnosis not present

## 2014-04-14 ENCOUNTER — Encounter (HOSPITAL_COMMUNITY): Payer: Self-pay | Admitting: Emergency Medicine

## 2014-04-14 ENCOUNTER — Observation Stay (HOSPITAL_COMMUNITY)
Admission: EM | Admit: 2014-04-14 | Discharge: 2014-04-17 | Disposition: A | Discharge status: Home / Self Care | Payer: Medicare Other | Attending: Internal Medicine | Admitting: Internal Medicine

## 2014-04-14 ENCOUNTER — Emergency Department (HOSPITAL_COMMUNITY): Payer: Medicare Other

## 2014-04-14 DIAGNOSIS — F17210 Nicotine dependence, cigarettes, uncomplicated: Secondary | ICD-10-CM | POA: Diagnosis not present

## 2014-04-14 DIAGNOSIS — H5441 Blindness, right eye, normal vision left eye: Secondary | ICD-10-CM | POA: Insufficient documentation

## 2014-04-14 DIAGNOSIS — F329 Major depressive disorder, single episode, unspecified: Secondary | ICD-10-CM | POA: Diagnosis not present

## 2014-04-14 DIAGNOSIS — Z8673 Personal history of transient ischemic attack (TIA), and cerebral infarction without residual deficits: Secondary | ICD-10-CM | POA: Insufficient documentation

## 2014-04-14 DIAGNOSIS — R42 Dizziness and giddiness: Secondary | ICD-10-CM | POA: Diagnosis not present

## 2014-04-14 DIAGNOSIS — N189 Chronic kidney disease, unspecified: Secondary | ICD-10-CM | POA: Diagnosis not present

## 2014-04-14 DIAGNOSIS — K219 Gastro-esophageal reflux disease without esophagitis: Secondary | ICD-10-CM | POA: Diagnosis not present

## 2014-04-14 DIAGNOSIS — H34231 Retinal artery branch occlusion, right eye: Secondary | ICD-10-CM | POA: Diagnosis present

## 2014-04-14 DIAGNOSIS — I129 Hypertensive chronic kidney disease with stage 1 through stage 4 chronic kidney disease, or unspecified chronic kidney disease: Secondary | ICD-10-CM | POA: Insufficient documentation

## 2014-04-14 DIAGNOSIS — I10 Essential (primary) hypertension: Secondary | ICD-10-CM | POA: Diagnosis present

## 2014-04-14 DIAGNOSIS — Z880 Allergy status to penicillin: Secondary | ICD-10-CM | POA: Insufficient documentation

## 2014-04-14 DIAGNOSIS — E785 Hyperlipidemia, unspecified: Secondary | ICD-10-CM | POA: Diagnosis not present

## 2014-04-14 DIAGNOSIS — E119 Type 2 diabetes mellitus without complications: Secondary | ICD-10-CM | POA: Insufficient documentation

## 2014-04-14 DIAGNOSIS — Z7902 Long term (current) use of antithrombotics/antiplatelets: Secondary | ICD-10-CM | POA: Insufficient documentation

## 2014-04-14 DIAGNOSIS — R531 Weakness: Secondary | ICD-10-CM | POA: Diagnosis not present

## 2014-04-14 DIAGNOSIS — R002 Palpitations: Secondary | ICD-10-CM | POA: Diagnosis not present

## 2014-04-14 DIAGNOSIS — F419 Anxiety disorder, unspecified: Secondary | ICD-10-CM | POA: Insufficient documentation

## 2014-04-14 DIAGNOSIS — Z90710 Acquired absence of both cervix and uterus: Secondary | ICD-10-CM | POA: Diagnosis not present

## 2014-04-14 DIAGNOSIS — G459 Transient cerebral ischemic attack, unspecified: Secondary | ICD-10-CM | POA: Diagnosis not present

## 2014-04-14 DIAGNOSIS — Z888 Allergy status to other drugs, medicaments and biological substances status: Secondary | ICD-10-CM | POA: Insufficient documentation

## 2014-04-14 DIAGNOSIS — Z881 Allergy status to other antibiotic agents status: Secondary | ICD-10-CM | POA: Diagnosis not present

## 2014-04-14 DIAGNOSIS — R4781 Slurred speech: Secondary | ICD-10-CM | POA: Diagnosis not present

## 2014-04-14 DIAGNOSIS — R404 Transient alteration of awareness: Secondary | ICD-10-CM | POA: Diagnosis not present

## 2014-04-14 DIAGNOSIS — Z87442 Personal history of urinary calculi: Secondary | ICD-10-CM | POA: Insufficient documentation

## 2014-04-14 DIAGNOSIS — Z886 Allergy status to analgesic agent status: Secondary | ICD-10-CM | POA: Diagnosis not present

## 2014-04-14 DIAGNOSIS — I639 Cerebral infarction, unspecified: Secondary | ICD-10-CM | POA: Diagnosis not present

## 2014-04-14 DIAGNOSIS — G458 Other transient cerebral ischemic attacks and related syndromes: Secondary | ICD-10-CM | POA: Diagnosis not present

## 2014-04-14 DIAGNOSIS — Z9049 Acquired absence of other specified parts of digestive tract: Secondary | ICD-10-CM | POA: Insufficient documentation

## 2014-04-14 DIAGNOSIS — K589 Irritable bowel syndrome without diarrhea: Secondary | ICD-10-CM | POA: Insufficient documentation

## 2014-04-14 LAB — BASIC METABOLIC PANEL
Anion gap: 7 (ref 5–15)
BUN: 8 mg/dL (ref 6–23)
CO2: 28 mmol/L (ref 19–32)
Calcium: 9.4 mg/dL (ref 8.4–10.5)
Chloride: 108 mmol/L (ref 96–112)
Creatinine, Ser: 0.93 mg/dL (ref 0.50–1.10)
GFR calc Af Amer: 63 mL/min — ABNORMAL LOW (ref >90)
GFR calc non Af Amer: 54 mL/min — ABNORMAL LOW (ref >90)
Glucose, Bld: 103 mg/dL — ABNORMAL HIGH (ref 70–99)
Potassium: 3.5 mmol/L (ref 3.5–5.1)
Sodium: 143 mmol/L (ref 135–145)

## 2014-04-14 LAB — CBC WITH DIFFERENTIAL/PLATELET
Basophils Absolute: 0 10*3/uL (ref 0.0–0.1)
Basophils Relative: 1 % (ref 0–1)
Eosinophils Absolute: 0.1 10*3/uL (ref 0.0–0.7)
Eosinophils Relative: 2 % (ref 0–5)
HCT: 40.9 % (ref 36.0–46.0)
Hemoglobin: 13.5 g/dL (ref 12.0–15.0)
Lymphocytes Relative: 23 % (ref 12–46)
Lymphs Abs: 1.6 10*3/uL (ref 0.7–4.0)
MCH: 29.3 pg (ref 26.0–34.0)
MCHC: 33 g/dL (ref 30.0–36.0)
MCV: 88.7 fL (ref 78.0–100.0)
Monocytes Absolute: 1 10*3/uL (ref 0.1–1.0)
Monocytes Relative: 14 % — ABNORMAL HIGH (ref 3–12)
Neutro Abs: 4.3 10*3/uL (ref 1.7–7.7)
Neutrophils Relative %: 60 % (ref 43–77)
Platelets: 187 10*3/uL (ref 150–400)
RBC: 4.61 MIL/uL (ref 3.87–5.11)
RDW: 13.5 % (ref 11.5–15.5)
WBC: 7 10*3/uL (ref 4.0–10.5)

## 2014-04-14 LAB — URINALYSIS, ROUTINE W REFLEX MICROSCOPIC
Bilirubin Urine: NEGATIVE
Glucose, UA: NEGATIVE mg/dL
Hgb urine dipstick: NEGATIVE
Ketones, ur: NEGATIVE mg/dL
Nitrite: NEGATIVE
Protein, ur: NEGATIVE mg/dL
Specific Gravity, Urine: 1.008 (ref 1.005–1.030)
Urobilinogen, UA: 0.2 mg/dL (ref 0.0–1.0)
pH: 6 (ref 5.0–8.0)

## 2014-04-14 LAB — URINE MICROSCOPIC-ADD ON

## 2014-04-14 LAB — TROPONIN I: Troponin I: <0.03 ng/mL (ref <0.031)

## 2014-04-14 MED ORDER — OLANZAPINE 5 MG PO TABS
5.0000 mg | ORAL_TABLET | Freq: Every day | ORAL | Status: DC
  Administered 2014-04-14 – 2014-04-17 (×3): 5 mg via ORAL
  Filled 2014-04-14 (×4): qty 1

## 2014-04-14 MED ORDER — EZETIMIBE 10 MG PO TABS: 5.0000 mg | ORAL_TABLET | Freq: Every day | ORAL | Status: DC

## 2014-04-14 MED ORDER — ENOXAPARIN SODIUM 30 MG/0.3ML ~~LOC~~ SOLN
30.0000 mg | SUBCUTANEOUS | Status: DC
  Administered 2014-04-14 – 2014-04-17 (×3): 30 mg via SUBCUTANEOUS
  Filled 2014-04-14 (×3): qty 0.3

## 2014-04-14 MED ORDER — PANTOPRAZOLE SODIUM 40 MG PO TBEC
40.0000 mg | DELAYED_RELEASE_TABLET | Freq: Every morning | ORAL | Status: DC
  Administered 2014-04-15 – 2014-04-17 (×3): 40 mg via ORAL
  Filled 2014-04-14 (×3): qty 1

## 2014-04-14 MED ORDER — ROSUVASTATIN CALCIUM 10 MG PO TABS
10.0000 mg | ORAL_TABLET | Freq: Every day | ORAL | Status: DC
  Administered 2014-04-14 – 2014-04-17 (×3): 10 mg via ORAL
  Filled 2014-04-14 (×4): qty 1

## 2014-04-14 MED ORDER — CYCLOSPORINE 0.05 % OP EMUL
1.0000 [drp] | Freq: Two times a day (BID) | OPHTHALMIC | Status: DC
  Administered 2014-04-14 – 2014-04-17 (×6): 1 [drp] via OPHTHALMIC
  Filled 2014-04-14 (×7): qty 1

## 2014-04-14 MED ORDER — STROKE: EARLY STAGES OF RECOVERY BOOK
Freq: Once | Status: AC
  Administered 2014-04-14: 23:00:00

## 2014-04-14 MED ORDER — CLOPIDOGREL BISULFATE 75 MG PO TABS
75.0000 mg | ORAL_TABLET | Freq: Every morning | ORAL | Status: DC
  Administered 2014-04-15 – 2014-04-17 (×3): 75 mg via ORAL
  Filled 2014-04-14 (×3): qty 1

## 2014-04-14 MED ORDER — AMLODIPINE BESYLATE 5 MG PO TABS
5.0000 mg | ORAL_TABLET | Freq: Every morning | ORAL | Status: DC
  Administered 2014-04-16 – 2014-04-17 (×2): 5 mg via ORAL
  Filled 2014-04-14 (×2): qty 1

## 2014-04-14 MED ORDER — VITAMIN D 1000 UNITS PO TABS
1000.0000 [IU] | ORAL_TABLET | Freq: Every morning | ORAL | Status: DC
  Administered 2014-04-15 – 2014-04-17 (×3): 1000 [IU] via ORAL
  Filled 2014-04-14 (×3): qty 1

## 2014-04-14 MED ORDER — ASPIRIN 325 MG PO TABS: 325.0000 mg | ORAL_TABLET | Freq: Every day | ORAL | Status: DC

## 2014-04-14 MED ORDER — ASPIRIN 300 MG RE SUPP: 300.0000 mg | Freq: Every day | RECTAL | Status: DC

## 2014-04-14 MED ORDER — SENNOSIDES-DOCUSATE SODIUM 8.6-50 MG PO TABS: 1.0000 | ORAL_TABLET | Freq: Every evening | ORAL | Status: DC | PRN

## 2014-04-14 MED ORDER — ESCITALOPRAM OXALATE 10 MG PO TABS
5.0000 mg | ORAL_TABLET | Freq: Every morning | ORAL | Status: DC
  Administered 2014-04-15 – 2014-04-17 (×3): 5 mg via ORAL
  Filled 2014-04-14 (×3): qty 1

## 2014-04-14 NOTE — ED Provider Notes (Signed)
CSN: [id number]     Arrival date & time 04/14/14  1737 History   First MD Initiated Contact with Patient 04/14/14 1741     Chief Complaint  Patient presents with  . Dizziness     (Consider location/radiation/quality/duration/timing/severity/associated sxs/prior Treatment) HPI   The patient is an 81 y/o female with a past history of ischemic stroke 2 years ago who presents to the emergency department for an episode of garbled speech with persistent weakness and dizziness. Yesterday she was with her husband and had a 1-2 minute episode of garbled speech. She denies impaired cognition and was aware that she was having trouble saying the words she wanted to. Her speech improved but she felt weak and lightheaded afterward. Her husband did not notice any facial drooping. She continues to have generalized weakness and intermittent lightheadedness. She is blind in her right eye, a deficit from her previous stroke, but denies vision changes or loss of vision in her left eye. She has not felt any more imbalanced than usual, she attributes her baseline imbalance to her right eye blindness. She denies focal weakness. She denies headache, numbness/tingling, confusion, chest pain, palpitations, shortness of breath, fevers, chills, dysuria, incontinence of bowel or bladder. She denies falling, head trauma, and LOC.  PMH is significant for hyperlipidemia, previous ischemic stroke, heart murmur for which she sees a cardiologist. She has yearly echocardiograms for this and is due for her next in June 2016.  She smokes 3/4 ppd of cigarettes for "a long time."  Past Medical History  Diagnosis Date  . HTN (hypertension)   . Aortic aneurysm   . Transient ischemic attack   . Murmur, cardiac   . Cholelithiasis   . Internal hemorrhoids   . Irritable bowel syndrome   . Depression   . Eustachian tube dysfunction     pt denies  . Appendicitis   . CVA (cerebral infarction)     Right retinal branch occlusion  .  Carotid artery plaque     hx of per left side   . Stroke   . Legally blind in right eye, as defined in [place]     related to past CVA  . Anxiety   . Chronic kidney disease     hx of kidney stones  . GERD (gastroesophageal reflux disease)    Past Surgical History  Procedure Laterality Date  . Abdominal aortic aneurysm repair  2005  . Kidney stones removed    . Appendectomy    . Abdominal hysterectomy    . Cholecystectomy    . Shoulder surgery      Left  . Wrist surgery      Right  . Tee without cardioversion  12/18/2011    Procedure: TRANSESOPHAGEAL ECHOCARDIOGRAM (TEE);  Surgeon: Larey Dresser, MD;  Location: [place];  Service: Cardiovascular;  Laterality: N/A;  . Eye surgery      hx of lazy eye    Family History  Problem Relation Age of Onset  . Asthma Brother     died age 51  . Diabetes Mother   . Coronary artery disease Mother     died age 46  . Coronary artery disease Father     died age 64  . Coronary artery disease Brother     died age 70  . Coronary artery disease Sister     died age 69   History  Substance Use Topics  . Smoking status: Current Every Day Smoker -- 0.50 packs/day for 20 years  Types: Cigarettes  . Smokeless tobacco: Never Used  . Alcohol Use: No   OB History    No data available     Review of Systems  All other systems negative except as documented in the HPI. All pertinent positives and negatives as reviewed in the HPI.   Allergies  Shellfish allergy; Xylocaine; Ampicillin; Biaxin; Celebrex; Cozaar; Demerol; Elavil; Fosamax; Hydromorphone; Inderal; Ivp dye; Levaquin; Macrodantin; Mycinette; Neggram; Novocain; Pamelor; Paxil; Penicillins; Robaxin; Tamiflu; Tenormin; Triavil; Wellbutrin; and Zoloft  Home Medications   Prior to Admission medications   Medication Sig Start Date End Date Taking? Authorizing Provider  acetaminophen (TYLENOL) 500 MG tablet Take 500 mg by mouth every 6 (six) hours as needed for moderate pain or  headache.    Historical Provider, MD  amLODipine (NORVASC) 5 MG tablet Take 5 mg by mouth every morning.     Historical Provider, MD  Cholecalciferol (VITAMIN D PO) Take 1 tablet by mouth every morning.     Historical Provider, MD  clopidogrel (PLAVIX) 75 MG tablet Take 1 tablet by mouth every morning.  10/28/12   Historical Provider, MD  Coenzyme Q10 (CO Q-10) 50 MG CAPS Take 1 capsule by mouth every morning. Pt states takes 30 mgs    Historical Provider, MD  cycloSPORINE (RESTASIS) 0.05 % ophthalmic emulsion Place 1 drop into both eyes 2 (two) times daily.    Historical Provider, MD  escitalopram (LEXAPRO) 10 MG tablet Take 5 mg by mouth every morning.     Historical Provider, MD  ezetimibe (ZETIA) 10 MG tablet Take 5 mg by mouth at bedtime.     Historical Provider, MD  OLANZapine (ZYPREXA) 2.5 MG tablet Take 5 mg by mouth at bedtime.     Historical Provider, MD  pantoprazole (PROTONIX) 40 MG tablet Take 40 mg by mouth every morning.     Historical Provider, MD  Polyethyl Glycol-Propyl Glycol (SYSTANE FREE OP) Place 1 drop into both eyes daily as needed (dry eyes).     Historical Provider, MD  rosuvastatin (CRESTOR) 10 MG tablet Take 10 mg by mouth at bedtime.     Historical Provider, MD   BP 163/78 mmHg  Pulse 57  Temp(Src) 98.3 F (36.8 C) (Oral)  Resp 18  SpO2 95% Physical Exam  Constitutional: She is oriented to person, place, and time. She appears well-developed and well-nourished. No distress.  HENT:  Head: Normocephalic and atraumatic.  Right Ear: External ear normal.  Left Ear: External ear normal.  Eyes: Pupils are equal, round, and reactive to light. Left eye exhibits normal extraocular motion.  Neck: Normal range of motion. Neck supple. No thyromegaly present.  Cardiovascular: Normal rate, regular rhythm and intact distal pulses.  Exam reveals no gallop and no friction rub.   Murmur heard.  Systolic murmur is present  Whooshing murmur heard with S1. Heard best at R 2nd ICS.  Not heard at carotids.  Pulmonary/Chest: Effort normal and breath sounds normal. No respiratory distress. She has no wheezes.  Lymphadenopathy:    She has no cervical adenopathy.  Neurological: She is alert and oriented to person, place, and time. She has normal strength. No cranial nerve deficit or sensory deficit. She exhibits normal muscle tone. Coordination normal.  Skin: Skin is warm and dry. She is not diaphoretic.  Psychiatric: She has a normal mood and affect. Her speech is normal. Thought content normal.  Nursing note and vitals reviewed.   ED Course  Procedures (including critical care time) Labs Review Labs Reviewed -  No data to display  Imaging Review No results found.   EKG Interpretation   Date/Time:  Saturday April 14 2014 17:49:15 EST Ventricular Rate:  59 PR Interval:  184 QRS Duration: 75 QT Interval:  440 QTC Calculation: 436 R Axis:   73 Text Interpretation:  Sinus rhythm Baseline wander in lead(s) V2 minimal  ST changes in inferior lead, no significant change from prior given  artifact present Confirmed by Canary Brim  MD, MARTHA [phone]) on 04/14/2014  5:54:46 PM      MDM   Final diagnoses:  None   Patient be admitted to the hospital for further evaluation and care.  I spoke with the [facility] about the patient.  The patient is advised the plan.  All questions were answered   Dalia Heading, PA-C 04/16/14 [id number]  Alfonzo Beers, MD 04/17/14 [phone]

## 2014-04-14 NOTE — Consult Note (Signed)
Admission H&P    Chief Complaint: Transient speech difficulty.  HPI: Allison [name] is an 81 y.o. female history of hypertension, a, central retinal artery occlusion in right eyel chronic kidney disease and aortic aneurysm, resulting with a history of transient speech output difficulty started at 1630 on 04/13/2014. Symptoms resolved after about 5 minutes. She felt weak all over but had no disproportionate focal weakness. She knew what she wanted to say and could understand what was being said to her but had difficulty with output. She has been taking Plavix for antiplatelet therapy. She has not had a recurrence of symptoms. NIH stroke score at the time of this evaluation was 0. Patient is being admitted for stroke risk assessment as well as to rule out acute stroke.  LSN: 4:30 PM on 04/13/2014 tPA Given: No: Deficits rapidly resolved MRankin: 0  Past Medical History  Diagnosis Date  . HTN (hypertension)   . Aortic aneurysm   . Transient ischemic attack   . Murmur, cardiac   . Cholelithiasis   . Internal hemorrhoids   . Irritable bowel syndrome   . Depression   . Eustachian tube dysfunction     pt denies  . Appendicitis   . CVA (cerebral infarction)     Right retinal branch occlusion  . Carotid artery plaque     hx of per left side   . Stroke   . Legally blind in right eye, as defined in [place]     related to past CVA  . Anxiety   . Chronic kidney disease     hx of kidney stones  . GERD (gastroesophageal reflux disease)     Past Surgical History  Procedure Laterality Date  . Abdominal aortic aneurysm repair  2005  . Kidney stones removed    . Appendectomy    . Abdominal hysterectomy    . Cholecystectomy    . Shoulder surgery      Left  . Wrist surgery      Right  . Tee without cardioversion  12/18/2011    Procedure: TRANSESOPHAGEAL ECHOCARDIOGRAM (TEE);  Surgeon: Larey Dresser, MD;  Location: [place];  Service: Cardiovascular;  Laterality: N/A;  . Eye surgery       hx of lazy eye     Family History  Problem Relation Age of Onset  . Asthma Brother     died age 3  . Diabetes Mother   . Coronary artery disease Mother     died age 62  . Coronary artery disease Father     died age 60  . Coronary artery disease Brother     died age 49  . Coronary artery disease Sister     died age 33   Social History:  reports that she has been smoking Cigarettes.  She has a 10 pack-year smoking history. She has never used smokeless tobacco. She reports that she does not drink alcohol or use illicit drugs.  Allergies:  Allergies  Allergen Reactions  . Shellfish Allergy Shortness Of Breath and Rash    All seafood  . Xylocaine [Lidocaine] Shortness Of Breath  . Ampicillin     unknown  . Biaxin [Clarithromycin]     unknown  . Celebrex [Celecoxib]     Unknown   . Cozaar [Losartan]     High blood pressure  . Demerol [Meperidine]     unknown  . Elavil [Amitriptyline]     unknown  . Fosamax [Alendronate]     unknown  .  Hydromorphone     Hives   . Inderal [Propranolol]     unknown  . Ivp Dye [Iodinated Diagnostic Agents]     rash  . Levaquin [Levofloxacin]     unknown  . Macrodantin [Nitrofurantoin]     unknown  . Mycinette [Phenol]     Unknown   . Neggram [Nalidixic Acid]     unknown  . Novocain [Procaine]     unknown  . Pamelor [Nortriptyline]     unknown  . Penicillins     rash  . Robaxin [Methocarbamol]     unknown  . Tamiflu [Oseltamivir]     Unknown   . Tenormin [Atenolol]     unknown  . Triavil [Perphenazine-Amitriptyline]     Unknown   . Paxil [Paroxetine] Rash  . Wellbutrin [Bupropion] Rash  . Zoloft [Sertraline] Rash    Medications Prior to Admission  Medication Sig Dispense Refill  . acetaminophen (TYLENOL) 500 MG tablet Take 500 mg by mouth every 6 (six) hours as needed for moderate pain or headache.    Marland Kitchen amLODipine (NORVASC) 5 MG tablet Take 5 mg by mouth every morning.     . Cholecalciferol (VITAMIN D PO) Take 1  tablet by mouth every morning.     . clopidogrel (PLAVIX) 75 MG tablet Take 1 tablet by mouth every morning.     . Coenzyme Q10 (CO Q-10) 50 MG CAPS Take 1 capsule by mouth every morning. Pt states takes 30 mgs    . cycloSPORINE (RESTASIS) 0.05 % ophthalmic emulsion Place 1 drop into both eyes 2 (two) times daily.    Marland Kitchen escitalopram (LEXAPRO) 10 MG tablet Take 5 mg by mouth every morning.     . ezetimibe (ZETIA) 10 MG tablet Take 5 mg by mouth at bedtime.     Marland Kitchen OLANZapine (ZYPREXA) 2.5 MG tablet Take 5 mg by mouth at bedtime.     . pantoprazole (PROTONIX) 40 MG tablet Take 40 mg by mouth every morning.     Vladimir Faster Glycol-Propyl Glycol (SYSTANE FREE OP) Place 1 drop into both eyes daily as needed (dry eyes).     . rosuvastatin (CRESTOR) 10 MG tablet Take 10 mg by mouth at bedtime.       ROS: History obtained from the patient  General ROS: negative for - chills, fatigue, fever, night sweats, weight gain or weight loss Psychological ROS: negative for - behavioral disorder, hallucinations, memory difficulties, mood swings or suicidal ideation Ophthalmic ROS: negative for - blurry vision, double vision, eye pain or loss of vision ENT ROS: negative for - epistaxis, nasal discharge, oral lesions, sore throat, tinnitus or vertigo Allergy and Immunology ROS: negative for - hives or itchy/watery eyes Hematological and Lymphatic ROS: negative for - bleeding problems, bruising or swollen lymph nodes Endocrine ROS: negative for - galactorrhea, hair pattern changes, polydipsia/polyuria or temperature intolerance Respiratory ROS: negative for - cough, hemoptysis, shortness of breath or wheezing Cardiovascular ROS: negative for - chest pain, dyspnea on exertion, edema or irregular heartbeat Gastrointestinal ROS: negative for - abdominal pain, diarrhea, hematemesis, nausea/vomiting or stool incontinence Genito-Urinary ROS: negative for - dysuria, hematuria, incontinence or urinary  frequency/urgency Musculoskeletal ROS: negative for - joint swelling or muscular weakness Neurological ROS: as noted in HPI Dermatological ROS: negative for rash and skin lesion changes  Physical Examination: Blood pressure 118/39, pulse 58, temperature 98.1 F (36.7 C), temperature source Oral, resp. rate 20, height 5' 3"  (1.6 m), weight 61.916 kg (136 lb 8 oz), SpO2 95 %.  HEENT-  Normocephalic, no lesions, without obvious abnormality.  Normal external eye and conjunctiva.  Normal TM's bilaterally.  Normal auditory canals and external ears. Normal external nose, mucus membranes and septum.  Normal pharynx. Neck supple with no masses, nodes, nodules or enlargement. Cardiovascular - regular rate and rhythm, S1, S2 normal, no murmur, click, rub or gallop Lungs - chest clear, no wheezing, rales, normal symmetric air entry Abdomen - soft, non-tender; bowel sounds normal; no masses,  no organomegaly Extremities - no joint deformities, effusion, or inflammation, no edema and no skin discoloration   Neurologic Examination: Mental Status: Alert, oriented, thought content appropriate.  Speech fluent without evidence of aphasia. Able to follow commands without difficulty. Cranial Nerves: II-Visual fields were normal with testing via left eye. III/IV/ Right pupil was larger than the left and did not react to light, no edema of the left pupil consensually; left pupil reacted normally to light, as did the right pupil consensually. Extraocular movements were full and conjugate.    V/VII-no facial numbness and no facial weakness. VIII-normal. X-normal speech and symmetrical palatal movement. XI: trapezius strength/neck flexion strength normal bilaterally XII-midline tongue extension with normal strength. Motor: 5/5 bilaterally with normal tone and bulk Sensory: Normal throughout. Deep Tendon Reflexes: 1+ and symmetric. Plantars: Flexor bilaterally Cerebellar: Normal finger-to-nose testing. Carotid  auscultation: Normal  Results for orders placed or performed during the hospital encounter of 04/14/14 (from the past 48 hour(s))  CBC with Differential     Status: Abnormal   Collection Time: 04/14/14  6:30 PM  Result Value Ref Range   WBC 7.0 4.0 - 10.5 K/uL   RBC 4.61 3.87 - 5.11 MIL/uL   Hemoglobin 13.5 12.0 - 15.0 g/dL   HCT 40.9 36.0 - 46.0 %   MCV 88.7 78.0 - 100.0 fL   MCH 29.3 26.0 - 34.0 pg   MCHC 33.0 30.0 - 36.0 g/dL   RDW 13.5 11.5 - 15.5 %   Platelets 187 150 - 400 K/uL   Neutrophils Relative % 60 43 - 77 %   Neutro Abs 4.3 1.7 - 7.7 K/uL   Lymphocytes Relative 23 12 - 46 %   Lymphs Abs 1.6 0.7 - 4.0 K/uL   Monocytes Relative 14 (H) 3 - 12 %   Monocytes Absolute 1.0 0.1 - 1.0 K/uL   Eosinophils Relative 2 0 - 5 %   Eosinophils Absolute 0.1 0.0 - 0.7 K/uL   Basophils Relative 1 0 - 1 %   Basophils Absolute 0.0 0.0 - 0.1 K/uL  Basic metabolic panel     Status: Abnormal   Collection Time: 04/14/14  6:30 PM  Result Value Ref Range   Sodium 143 135 - 145 mmol/L   Potassium 3.5 3.5 - 5.1 mmol/L   Chloride 108 96 - 112 mmol/L   CO2 28 19 - 32 mmol/L   Glucose, Bld 103 (H) 70 - 99 mg/dL   BUN 8 6 - 23 mg/dL   Creatinine, Ser 0.93 0.50 - 1.10 mg/dL   Calcium 9.4 8.4 - 10.5 mg/dL   GFR calc non Af Amer 54 (L) >90 mL/min   GFR calc Af Amer 63 (L) >90 mL/min    Comment: (NOTE) The eGFR has been calculated using the CKD EPI equation. This calculation has not been validated in all clinical situations. eGFR's persistently <90 mL/min signify possible Chronic Kidney Disease.    Anion gap 7 5 - 15  Troponin I     Status: None   Collection Time:  04/14/14  6:30 PM  Result Value Ref Range   Troponin I <0.03 <0.031 ng/mL    Comment:        NO INDICATION OF MYOCARDIAL INJURY.   Urinalysis, Routine w reflex microscopic     Status: Abnormal   Collection Time: 04/14/14  7:13 PM  Result Value Ref Range   Color, Urine YELLOW YELLOW   APPearance CLEAR CLEAR   Specific  Gravity, Urine 1.008 1.005 - 1.030   pH 6.0 5.0 - 8.0   Glucose, UA NEGATIVE NEGATIVE mg/dL   Hgb urine dipstick NEGATIVE NEGATIVE   Bilirubin Urine NEGATIVE NEGATIVE   Ketones, ur NEGATIVE NEGATIVE mg/dL   Protein, ur NEGATIVE NEGATIVE mg/dL   Urobilinogen, UA 0.2 0.0 - 1.0 mg/dL   Nitrite NEGATIVE NEGATIVE   Leukocytes, UA TRACE (A) NEGATIVE  Urine microscopic-add on     Status: None   Collection Time: 04/14/14  7:13 PM  Result Value Ref Range   Squamous Epithelial / LPF RARE RARE   WBC, UA 3-6 <3 WBC/hpf   RBC / HPF 0-2 <3 RBC/hpf   Ct Head Wo Contrast  04/14/2014   CLINICAL DATA:  81 year old female with slurred speech, dizziness and generalized weakness since yesterday.  EXAM: CT HEAD WITHOUT CONTRAST  TECHNIQUE: Contiguous axial images were obtained from the base of the skull through the vertex without intravenous contrast.  COMPARISON:  Head CT 10/07/2011.  Brain MRI 06/2011.  FINDINGS: Mild cerebral atrophy. Patchy and confluent areas of decreased attenuation are noted throughout the deep and periventricular white matter of the cerebral hemispheres bilaterally (left greater than right), compatible with chronic microvascular ischemic disease. No acute intracranial abnormalities. Specifically, no evidence of acute intracranial hemorrhage, no definite findings of acute/subacute cerebral ischemia, no mass, mass effect, hydrocephalus or abnormal intra or extra-axial fluid collections. Visualized paranasal sinuses and mastoids are well pneumatized. No acute displaced skull fractures are identified.  IMPRESSION: 1. No acute intracranial abnormalities. 2. Mild cerebral atrophy with extensive chronic microvascular ischemic changes in the cerebral white matter redemonstrated, as above.   Electronically Signed   By: Vinnie Langton M.D.   On: 04/14/2014 19:40    Assessment: 81 y.o. female with multiple risk factors for stroke, presenting with probable left MCA territory subcortical TIA. However,  small vessel ischemic infarction cannot be ruled out.  Stroke Risk Factors - diabetes mellitus, hyperlipidemia and hypertension  Plan: 1. HgbA1c, fasting lipid panel 2. MRI, MRA  of the brain without contrast 3. PT consult, OT consult, Speech consult 4. Echocardiogram 5. Carotid dopplers 6. Prophylactic therapy-Antiplatelet med: Plavix 7. Risk factor modification 8. Telemetry monitoring  C.R. Nicole Kindred, MD [facility] [phone]  04/14/2014, 11:40 PM

## 2014-04-14 NOTE — ED Notes (Signed)
EMS states Patient is from Home around 1630 yesterday she started having slurred speech but did not want to come to the hospital. EMS states every since then patient has had dizziness and general  weakness.  CBG 147 with EMS. Patient is blind in the right eye due to a pervious stroke.

## 2014-04-14 NOTE — H&P (Signed)
[facility] Admission History and Physical       STORIE HEFFERN IDP:[id number] DOB: 1926/06/26 DOA: 04/14/2014  Referring physician: EDP PCP: Horatio Pel, MD  Specialists:   Chief Complaint: Dizziness  HPI: Allison [name] is a 81 y.o. female with a history of an Embolic CVA with Right Retinal Occlusion  (09/2012) and hx of TIAs who presents to the ED with complaints of sudden onset of slurring of speech which lasted 5 minutes and was noticed by her husband , but persistent dizziness, and generalized weakness since.   She denies any associated headache of chest pain, or palpitations or ataxia.  She was encouraged to go to the ED since the beginning of her symptoms.    A Ct scan of the Head was performed and was negative for acute findings.   She was referred for medical observation and further studies.     Review of Systems:  Constitutional: No Weight Loss, No Weight Gain, Night Sweats, Fevers, Chills, +Dizziness, Light Headedness, Fatigue, or +Generalized Weakness HEENT: No Headaches, Difficulty Swallowing,Tooth/Dental Problems,Sore Throat,  No Sneezing, Rhinitis, Ear Ache, Nasal Congestion, or Post Nasal Drip,  Cardio-vascular:  No Chest pain, Orthopnea, PND, Edema in Lower Extremities, Anasarca, Dizziness, Palpitations  Resp: No Dyspnea, No DOE, No Productive Cough, No Non-Productive Cough, No Hemoptysis, No Wheezing.    GI: No Heartburn, Indigestion, Abdominal Pain, Nausea, Vomiting, Diarrhea, Constipation, Hematemesis, Hematochezia, Melena, Change in Bowel Habits,  Loss of Appetite  GU: No Dysuria, No Change in Color of Urine, No Urgency or Urinary Frequency, No Flank pain.  Musculoskeletal: No Joint Pain or Swelling, No Decreased Range of Motion, No Back Pain.  Neurologic: No Syncope, No Seizures, Muscle Weakness, Paresthesia, Vision Disturbance, +Chronic Right Eye Vision Loss, No Diplopia, No Vertigo, No Difficulty Walking,  Skin: No Rash or Lesions. Psych: No  Change in Mood or Affect, No Depression or Anxiety, No Memory loss, No Confusion, or Hallucinations   Past Medical History  Diagnosis Date  . HTN (hypertension)   . Aortic aneurysm   . Transient ischemic attack   . Murmur, cardiac   . Cholelithiasis   . Internal hemorrhoids   . Irritable bowel syndrome   . Depression   . Eustachian tube dysfunction     pt denies  . Appendicitis   . CVA (cerebral infarction)     Right retinal branch occlusion  . Carotid artery plaque     hx of per left side   . Stroke   . Legally blind in right eye, as defined in [place]     related to past CVA  . Anxiety   . Chronic kidney disease     hx of kidney stones  . GERD (gastroesophageal reflux disease)      Past Surgical History  Procedure Laterality Date  . Abdominal aortic aneurysm repair  2005  . Kidney stones removed    . Appendectomy    . Abdominal hysterectomy    . Cholecystectomy    . Shoulder surgery      Left  . Wrist surgery      Right  . Tee without cardioversion  12/18/2011    Procedure: TRANSESOPHAGEAL ECHOCARDIOGRAM (TEE);  Surgeon: Larey Dresser, MD;  Location: [place];  Service: Cardiovascular;  Laterality: N/A;  . Eye surgery      hx of lazy eye       Prior to Admission medications   Medication Sig Start Date End Date Taking? Authorizing Provider  acetaminophen (TYLENOL) 500  MG tablet Take 500 mg by mouth every 6 (six) hours as needed for moderate pain or headache.   Yes Historical Provider, MD  amLODipine (NORVASC) 5 MG tablet Take 5 mg by mouth every morning.    Yes Historical Provider, MD  Cholecalciferol (VITAMIN D PO) Take 1 tablet by mouth every morning.    Yes Historical Provider, MD  clopidogrel (PLAVIX) 75 MG tablet Take 1 tablet by mouth every morning.  10/28/12  Yes Historical Provider, MD  Coenzyme Q10 (CO Q-10) 50 MG CAPS Take 1 capsule by mouth every morning. Pt states takes 30 mgs   Yes Historical Provider, MD  cycloSPORINE (RESTASIS) 0.05 % ophthalmic  emulsion Place 1 drop into both eyes 2 (two) times daily.   Yes Historical Provider, MD  escitalopram (LEXAPRO) 10 MG tablet Take 5 mg by mouth every morning.    Yes Historical Provider, MD  ezetimibe (ZETIA) 10 MG tablet Take 5 mg by mouth at bedtime.    Yes Historical Provider, MD  OLANZapine (ZYPREXA) 2.5 MG tablet Take 5 mg by mouth at bedtime.    Yes Historical Provider, MD  pantoprazole (PROTONIX) 40 MG tablet Take 40 mg by mouth every morning.    Yes Historical Provider, MD  Polyethyl Glycol-Propyl Glycol (SYSTANE FREE OP) Place 1 drop into both eyes daily as needed (dry eyes).    Yes Historical Provider, MD  rosuvastatin (CRESTOR) 10 MG tablet Take 10 mg by mouth at bedtime.    Yes Historical Provider, MD     Allergies  Allergen Reactions  . Shellfish Allergy Shortness Of Breath and Rash    All seafood  . Xylocaine [Lidocaine] Shortness Of Breath  . Ampicillin     unknown  . Biaxin [Clarithromycin]     unknown  . Celebrex [Celecoxib]     Unknown   . Cozaar [Losartan]     High blood pressure  . Demerol [Meperidine]     unknown  . Elavil [Amitriptyline]     unknown  . Fosamax [Alendronate]     unknown  . Hydromorphone     Hives   . Inderal [Propranolol]     unknown  . Ivp Dye [Iodinated Diagnostic Agents]     rash  . Levaquin [Levofloxacin]     unknown  . Macrodantin [Nitrofurantoin]     unknown  . Mycinette [Phenol]     Unknown   . Neggram [Nalidixic Acid]     unknown  . Novocain [Procaine]     unknown  . Pamelor [Nortriptyline]     unknown  . Penicillins     rash  . Robaxin [Methocarbamol]     unknown  . Tamiflu [Oseltamivir]     Unknown   . Tenormin [Atenolol]     unknown  . Triavil [Perphenazine-Amitriptyline]     Unknown   . Paxil [Paroxetine] Rash  . Wellbutrin [Bupropion] Rash  . Zoloft [Sertraline] Rash    Social History:  reports that she has been smoking Cigarettes.  She has a 10 pack-year smoking history. She has never used smokeless  tobacco. She reports that she does not drink alcohol or use illicit drugs.    Family History  Problem Relation Age of Onset  . Asthma Brother     died age 31  . Diabetes Mother   . Coronary artery disease Mother     died age 48  . Coronary artery disease Father     died age 4  . Coronary artery disease Brother  died age 36  . Coronary artery disease Sister     died age 22       Physical Exam:  GEN:  Pleasant  Elderly Well Nourished and Well Developed  81 y.o. Caucasian female examined and in no acute distress; cooperative with exam Filed Vitals:   04/14/14 2000 04/14/14 2045 04/14/14 2100 04/14/14 2115  BP: 117/68 151/67 121/67 117/57  Pulse: 58 56 57 58  Temp:      TempSrc:      Resp: 16 18 19 23   SpO2: 96% 96% 96% 97%   Blood pressure 117/57, pulse 58, temperature 98.3 F (36.8 C), temperature source Oral, resp. rate 23, SpO2 97 %. PSYCH: She is alert and oriented x4; does not appear anxious does not appear depressed; affect is normal HEENT: Normocephalic and Atraumatic, Mucous membranes pink; PERRLA; EOM intact; Fundi:  Benign;  No scleral icterus, Nares: Patent, Oropharynx: Clear, Fair Dentition,    Neck:  FROM, No Cervical Lymphadenopathy nor Thyromegaly or Carotid Bruit; No JVD; Breasts:: Not examined CHEST WALL: No tenderness CHEST: Normal respiration, clear to auscultation bilaterally HEART: Regular rate and rhythm; no murmurs rubs or gallops BACK: No kyphosis or scoliosis; No CVA tenderness ABDOMEN: Positive Bowel Sounds,  Soft Non-Tender, No Rebound or Guarding; No Masses, No Organomegaly.    Rectal Exam: Not done EXTREMITIES: No  Cyanosis, Clubbing, or Edema; No Ulcerations. Genitalia: not examined PULSES: 2+ and symmetric SKIN: Normal hydration no rash or ulceration  CNS:  , Mental Status:  Alert, Oriented x 4 , Thought Content Appropriate. Speech Fluent without evidence of Aphasia. Able to follow 3 step commands without difficulty.  In No obvious pain.    Cranial Nerves:  II: Discs flat bilaterally; Visual fields Intact, chronic  Loss of vision to the Right Eye. Pupils equal and reactive.    III,IV, VI: Extra-ocular motions intact bilaterally    V,VII: smile symmetric, facial light touch sensation normal bilaterally    VIII: hearing intact    IX,X: gag reflex present    XI: bilateral shoulder shrug    XII: midline tongue extension   Motor:  Right:  Upper extremity 5/5     Left:  Upper extremity 5/5     Right:  Lower extremity 5/5    Left:  Lower extremity 5/5     Tone and Bulk:  normal tone throughout; no atrophy noted   Sensory:  Pinprick and light touch intact throughout, bilaterally   Deep Tendon Reflexes: 2+ and symmetric throughout   Plantars/ Babinski:  Right: normal Left normal    Cerebellar:  Finger to nose without difficulty.   Gait: deferred    Vascular: pulses palpable throughout    Labs on Admission:  Basic Metabolic Panel:  Recent Labs Lab 04/14/14 1830  NA 143  K 3.5  CL 108  CO2 28  GLUCOSE 103*  BUN 8  CREATININE 0.93  CALCIUM 9.4   Liver Function Tests: No results for input(s): AST, ALT, ALKPHOS, BILITOT, PROT, ALBUMIN in the last 168 hours. No results for input(s): LIPASE, AMYLASE in the last 168 hours. No results for input(s): AMMONIA in the last 168 hours. CBC:  Recent Labs Lab 04/14/14 1830  WBC 7.0  NEUTROABS 4.3  HGB 13.5  HCT 40.9  MCV 88.7  PLT 187   Cardiac Enzymes:  Recent Labs Lab 04/14/14 1830  TROPONINI <0.03    BNP (last 3 results) No results for input(s): BNP in the last 8760 hours.  ProBNP (last 3 results)  No results for input(s): PROBNP in the last 8760 hours.  CBG: No results for input(s): GLUCAP in the last 168 hours.  Radiological Exams on Admission: Ct Head Wo Contrast  04/14/2014   CLINICAL DATA:  81 year old female with slurred speech, dizziness and generalized weakness since yesterday.  EXAM: CT HEAD WITHOUT CONTRAST  TECHNIQUE: Contiguous axial  images were obtained from the base of the skull through the vertex without intravenous contrast.  COMPARISON:  Head CT 10/07/2011.  Brain MRI 06/2011.  FINDINGS: Mild cerebral atrophy. Patchy and confluent areas of decreased attenuation are noted throughout the deep and periventricular white matter of the cerebral hemispheres bilaterally (left greater than right), compatible with chronic microvascular ischemic disease. No acute intracranial abnormalities. Specifically, no evidence of acute intracranial hemorrhage, no definite findings of acute/subacute cerebral ischemia, no mass, mass effect, hydrocephalus or abnormal intra or extra-axial fluid collections. Visualized paranasal sinuses and mastoids are well pneumatized. No acute displaced skull fractures are identified.  IMPRESSION: 1. No acute intracranial abnormalities. 2. Mild cerebral atrophy with extensive chronic microvascular ischemic changes in the cerebral white matter redemonstrated, as above.   Electronically Signed   By: Vinnie Langton M.D.   On: 04/14/2014 19:40     EKG: Independently reviewed.    Assessment/Plan:   80 y.o. female with  Principal Problem:   1.    CVA (cerebral infarction)/Hx of Retinal artery branch occlusion of right eye /Weakness   CVA Protocol   Telemetry Monitoring   MRI/MRA of Brain ordered   Carotid US and 2D ECHO in AM   Fasting Lipids and HbA1C in AM   Neuro Checks   ASA Rx   Continue Plavix ASA, and Crestor Rx   Neurology Consulted   Active Problems:     2.    HTN (hypertension)   Continue Amlodipine Rx   Monitor BPs        3.    Hyperlipidemia   Continue Crestor Rx        4.    DVT Prophylaxis   Lovenox          Code Status:     FULL CODE     Family Communication:   Husband and Daughter at Bedside       Disposition Plan:    Observation Status        Time spent:  [place] [facility] Pager [phone]   If [place] Please Contact the Day Rounding  Team MD for [facility]  If 7PM-7AM, Please Contact Night-Floor Coverage  [email or web address] Password [email or web address] 04/14/2014, 10:35 PM     ADDENDUM:   Patient was seen and examined on 04/14/2014

## 2014-04-15 ENCOUNTER — Observation Stay (HOSPITAL_COMMUNITY): Payer: Medicare Other

## 2014-04-15 DIAGNOSIS — G451 Carotid artery syndrome (hemispheric): Secondary | ICD-10-CM

## 2014-04-15 DIAGNOSIS — R42 Dizziness and giddiness: Secondary | ICD-10-CM | POA: Diagnosis not present

## 2014-04-15 DIAGNOSIS — H34231 Retinal artery branch occlusion, right eye: Secondary | ICD-10-CM

## 2014-04-15 DIAGNOSIS — G459 Transient cerebral ischemic attack, unspecified: Secondary | ICD-10-CM | POA: Diagnosis not present

## 2014-04-15 DIAGNOSIS — G9389 Other specified disorders of brain: Secondary | ICD-10-CM | POA: Diagnosis not present

## 2014-04-15 DIAGNOSIS — I10 Essential (primary) hypertension: Secondary | ICD-10-CM | POA: Diagnosis not present

## 2014-04-15 LAB — LIPID PANEL
CHOL/HDL RATIO: 3.7 ratio
Cholesterol: 103 mg/dL (ref 0–200)
HDL: 28 mg/dL — ABNORMAL LOW (ref >39)
LDL Cholesterol: 45 mg/dL (ref 0–99)
TRIGLYCERIDES: 149 mg/dL (ref <150)
VLDL: 30 mg/dL (ref 0–40)

## 2014-04-15 MED ORDER — EZETIMIBE 10 MG PO TABS
5.0000 mg | ORAL_TABLET | Freq: Every day | ORAL | Status: DC
  Administered 2014-04-15 – 2014-04-17 (×3): 5 mg via ORAL
  Filled 2014-04-15 (×3): qty 1

## 2014-04-15 NOTE — Progress Notes (Signed)
Pt arrived to unit via stretcher from ED, vitals taken, oriented pt to unit and informed pt of unit protocols, and upcoming procedures.

## 2014-04-15 NOTE — Progress Notes (Signed)
[facility] PROGRESS NOTE  Allison [name] HCW:[id number] DOB: [birth year] DOA: 04/14/2014 PCP: Horatio Pel, MD  Assessment/Plan: 1. Speech disturbance and Dizziness -transient, resolved -MRI negative -ECHO/Carotid duplex pending -LDL: 45 -Pt/OT evals pending -continue plavix, stop ASA -continue statin  2. H/O CRAO  3. HTN -BP soft, hold norvasc  4. Depression/anxiety -continue lexapro and celexa  Code Status: Full Code Family Communication:none at bedside Disposition Plan: home pending workup   Consultants:  Neuro  HPI/Subjective: No complaints, speech back to normal, dizziness resolved  Objective: Filed Vitals:   04/15/14 0906  BP: 122/56  Pulse: 51  Temp: 97.7 F (36.5 C)  Resp: 20   No intake or output data in the 24 hours ending 04/15/14 0959 Filed Weights   04/14/14 2259  Weight: 61.916 kg (136 lb 8 oz)    Exam:   General:  AAOx3  Cardiovascular: S1S2/RRR  Respiratory: CTAB  Abdomen: soft, NT, BS present  Musculoskeletal: no edema c/c  Neuro: non focal  Data Reviewed: Basic Metabolic Panel:  Recent Labs Lab 04/14/14 1830  NA 143  K 3.5  CL 108  CO2 28  GLUCOSE 103*  BUN 8  CREATININE 0.93  CALCIUM 9.4   Liver Function Tests: No results for input(s): AST, ALT, ALKPHOS, BILITOT, PROT, ALBUMIN in the last 168 hours. No results for input(s): LIPASE, AMYLASE in the last 168 hours. No results for input(s): AMMONIA in the last 168 hours. CBC:  Recent Labs Lab 04/14/14 1830  WBC 7.0  NEUTROABS 4.3  HGB 13.5  HCT 40.9  MCV 88.7  PLT 187   Cardiac Enzymes:  Recent Labs Lab 04/14/14 1830  TROPONINI <0.03   BNP (last 3 results) No results for input(s): BNP in the last 8760 hours.  ProBNP (last 3 results) No results for input(s): PROBNP in the last 8760 hours.  CBG: No results for input(s): GLUCAP in the last 168 hours.  No results found for this or any previous visit (from the past 240 hour(s)).    Studies: Ct Head Wo Contrast  04/14/2014   CLINICAL DATA:  81 year old female with slurred speech, dizziness and generalized weakness since yesterday.  EXAM: CT HEAD WITHOUT CONTRAST  TECHNIQUE: Contiguous axial images were obtained from the base of the skull through the vertex without intravenous contrast.  COMPARISON:  Head CT 10/07/2011.  Brain MRI 06/2011.  FINDINGS: Mild cerebral atrophy. Patchy and confluent areas of decreased attenuation are noted throughout the deep and periventricular white matter of the cerebral hemispheres bilaterally (left greater than right), compatible with chronic microvascular ischemic disease. No acute intracranial abnormalities. Specifically, no evidence of acute intracranial hemorrhage, no definite findings of acute/subacute cerebral ischemia, no mass, mass effect, hydrocephalus or abnormal intra or extra-axial fluid collections. Visualized paranasal sinuses and mastoids are well pneumatized. No acute displaced skull fractures are identified.  IMPRESSION: 1. No acute intracranial abnormalities. 2. Mild cerebral atrophy with extensive chronic microvascular ischemic changes in the cerebral white matter redemonstrated, as above.   Electronically Signed   By: Vinnie Langton M.D.   On: 04/14/2014 19:40   Mr Brain Wo Contrast  04/15/2014   CLINICAL DATA:  Speech difficulty a beginning at 1630 hours yesterday, subsequent weakness. On Plavix, history of hypertension, retinal artery occlusion, kidney disease, aortic aneurysm. Assess for acute stroke.  EXAM: MRI HEAD WITHOUT CONTRAST  TECHNIQUE: Multiplanar, multiecho pulse sequences of the brain and surrounding structures were obtained without intravenous contrast.  COMPARISON:  CT of the head April 14, 2014 and MRI  of the brain December 08, 2011  FINDINGS: No reduced diffusion to suggest acute ischemia. No susceptibility artifact to suggest hemorrhage.  Moderate ventriculomegaly, likely on the basis of global parenchymal brain  volume loss as there is overall commensurate enlargement of cerebral sulci and cerebellar folia, unchanged. Sub cm foci of FLAIR hyperintense signal on the RIGHT occipital cortex, increased from prior imaging. Patchy to confluent supratentorial pontine white matter FLAIR T2 hyperintensities, similar. Remote small RIGHT cerebellar infarcts. Tiny T2 hyperintensities in the basal ganglia and thalamus are unchanged. No midline shift, mass effect or mass lesions.  No abnormal extra-axial fluid collections. Sulcal ectatic intracranial vessels, vascular flow voids seen at the skull base.  Ocular globes and orbital contents are unremarkable. Trace maxillary sinus mucosal thickening without air-fluid levels. No abnormal sellar expansion. Probable pannus about the odontoid process effaces the ventral thecal sac, unchanged. Patient is edentulous.  IMPRESSION: No acute ischemia.  Increasing small area of RIGHT occipital cortical encephalomalacia suggesting progressed RIGHT posterior cerebral artery territory infarct.  Moderate to severe white matter changes suggest chronic small vessel ischemic disease. Remote lacunar infarcts, small RIGHT cerebellar infarct.   Electronically Signed   By: Elon Alas   On: 04/15/2014 00:51    Scheduled Meds: . amLODipine  5 mg Oral q morning - 10a  . cholecalciferol  1,000 Units Oral q morning - 10a  . clopidogrel  75 mg Oral q morning - 10a  . cycloSPORINE  1 drop Both Eyes BID  . enoxaparin (LOVENOX) injection  30 mg Subcutaneous Q24H  . escitalopram  5 mg Oral q morning - 10a  . ezetimibe  5 mg Oral Daily  . OLANZapine  5 mg Oral QHS  . pantoprazole  40 mg Oral q morning - 10a  . rosuvastatin  10 mg Oral QHS   Continuous Infusions:  Antibiotics Given (last 72 hours)    None      Principal Problem:   CVA (cerebral infarction) Active Problems:   HTN (hypertension)   Embolic stroke   Hyperlipemia   Retinal artery branch occlusion of right eye   Weakness    Dizziness and giddiness   TIA (transient ischemic attack)    Time spent: 66min    Rayburn Mundis  [facility] Pager [phone]. If 7PM-7AM, please contact night-coverage at [email or web address], password [facility] 04/15/2014, 9:59 AM

## 2014-04-15 NOTE — Evaluation (Signed)
Physical Therapy Evaluation Patient Details Name: Allison [name] MRN: [id number] DOB: [birth year] Today's Date: 04/15/2014   History of Present Illness    Allison [name] is an 81 y.o. female history of hypertension, a, central retinal artery occlusion in right eyel chronic kidney disease and aortic aneurysm, resulting with a history of transient speech output difficulty started at 1630 on 04/13/2014. Symptoms resolved after about 5 minutes. She felt weak all over but had no disproportionate focal weakness. She knew what she wanted to say and could understand what was being said to her but had difficulty with output. She has been taking Plavix for antiplatelet therapy. She has not had a recurrence of symptoms. NIH stroke score at the time of this evaluation was 0. Patient is being admitted for stroke risk assessment as well as to rule out acute stroke.  Clinical Impression  Pt admitted with above diagnosis. Pt currently with functional limitations due to the deficits listed below (see PT Problem List). Pt slightly unsteady on her feet therefore recommended her using her RW initially on d/c home and pt agrees. Will need HHPT f/u as well. Husband can provide 24 hour care per pt.   Pt will benefit from skilled PT to increase their independence and safety with mobility to allow discharge to the venue listed below.      Follow Up Recommendations Home health PT;Supervision/Assistance - 24 hour    Equipment Recommendations  None recommended by PT    Recommendations for Other Services       Precautions / Restrictions Precautions Precautions: Fall Restrictions Weight Bearing Restrictions: No      Mobility  Bed Mobility Overal bed mobility: Needs Assistance Bed Mobility: Supine to Sit     Supine to sit: Min assist        Transfers Overall transfer level: Needs assistance   Transfers: Sit to/from Stand Sit to Stand: Min guard         General transfer comment: needed steadying assist  upon rising from bed  Ambulation/Gait Ambulation/Gait assistance: Min guard Ambulation Distance (Feet): 50 Feet Assistive device: None Gait Pattern/deviations: Step-through pattern;Decreased stride length   Gait velocity interpretation: Below normal speed for age/gender General Gait Details: Pt needed steadying assist at times with ambulation.  Informed pt to use the cane or RW for steadying herself initially upon d/c for ssafety and pt agreed.   Stairs            Wheelchair Mobility    Modified Rankin (Stroke Patients Only) Modified Rankin (Stroke Patients Only) Pre-Morbid Rankin Score: Moderate disability Modified Rankin: Moderately severe disability     Balance Overall balance assessment: Needs assistance         Standing balance support: No upper extremity supported;During functional activity Standing balance-Leahy Scale: Poor Standing balance comment: Needs steadying assist or UE support for static balance.              High level balance activites: Turns;Direction changes;Sudden stops High Level Balance Comments: Needs assist with challenges.              Pertinent Vitals/Pain Pain Assessment: No/denies pain  VSS    Home Living Family/patient expects to be discharged to:: Private residence Living Arrangements: Spouse/significant other Available Help at Discharge: Family;Available 24 hours/day Type of Home: House Home Access: Stairs to enter;Ramped entrance Entrance Stairs-Rails: Right Entrance Stairs-Number of Steps: 2 Home Layout: One level Home Equipment: Walker - 2 wheels;Cane - single point;Grab bars - tub/shower Additional Comments: States husband walks  with a cane as well.     Prior Function Level of Independence: Independent         Comments: States her and husband manage well at home.      Hand Dominance   Dominant Hand: Right    Extremity/Trunk Assessment   Upper Extremity Assessment: Defer to OT evaluation            Lower Extremity Assessment: Generalized weakness      Cervical / Trunk Assessment: Normal  Communication   Communication: No difficulties  Cognition Arousal/Alertness: Awake/alert Behavior During Therapy: Anxious Overall Cognitive Status: Within Functional Limits for tasks assessed                      General Comments      Exercises        Assessment/Plan    PT Assessment Patient needs continued PT services  PT Diagnosis Generalized weakness   PT Problem List Decreased activity tolerance;Decreased balance;Decreased mobility;Decreased knowledge of use of DME;Decreased safety awareness;Decreased knowledge of precautions  PT Treatment Interventions DME instruction;Gait training;Functional mobility training;Therapeutic activities;Therapeutic exercise;Balance training;Patient/family education   PT Goals (Current goals can be found in the Care Plan section) Acute Rehab PT Goals Patient Stated Goal: to go home PT Goal Formulation: With patient Time For Goal Achievement: 04/22/14 Potential to Achieve Goals: Good    Frequency Min 3X/week   Barriers to discharge        Co-evaluation               End of Session Equipment Utilized During Treatment: Gait belt Activity Tolerance: Patient limited by fatigue Patient left: in chair;with call bell/phone within reach;with chair alarm set Nurse Communication: Mobility status    Functional Assessment Tool Used: clinical judgment Functional Limitation: Mobility: Walking and moving around Mobility: Walking and Moving Around Current Status [phone]): At least 20 percent but less than 40 percent impaired, limited or restricted Mobility: Walking and Moving Around Goal Status [phone]): At least 1 percent but less than 20 percent impaired, limited or restricted    Time: 1203-1213 PT Time Calculation (min) (ACUTE ONLY): 10 min   Charges:   PT Evaluation $Initial PT Evaluation Tier I: 1 Procedure     PT G Codes:    PT G-Codes **NOT FOR INPATIENT CLASS** Functional Assessment Tool Used: clinical judgment Functional Limitation: Mobility: Walking and moving around Mobility: Walking and Moving Around Current Status (N2778): At least 20 percent but less than 40 percent impaired, limited or restricted Mobility: Walking and Moving Around Goal Status [phone]): At least 1 percent but less than 20 percent impaired, limited or restricted    Denice Paradise 04/15/2014, 2:02 PM Rahima Fleishman,PT Acute Rehabilitation [phone] (pager)

## 2014-04-15 NOTE — Progress Notes (Signed)
Utilization review completed.  P.J. Madicyn Mesina,RN,BSN Case Manager 

## 2014-04-15 NOTE — Progress Notes (Signed)
STROKE TEAM PROGRESS NOTE   HISTORY Allison [name] is an 81 y.o. female history of hypertension, a central retinal artery occlusion in right eye, chronic kidney disease and aortic aneurysm, presenting with a history of transient speech output difficulty started at 1630 on 04/13/2014. Symptoms resolved after about 5 minutes. She felt weak all over but had no disproportionate focal weakness. She knew what she wanted to say and could understand what was being said to her but had difficulty with output. She has been taking Plavix for antiplatelet therapy. She has not had a recurrence of symptoms. NIH stroke score at the time of this evaluation was 0. Patient is being admitted for stroke risk assessment as well as to rule out acute stroke.  LSN: 4:30 PM on 04/13/2014 tPA Given: No: Deficits rapidly resolved MRankin: 0   SUBJECTIVE (INTERVAL HISTORY) The patient reports that she hasn't had no recurrent episodes of dysarthria. She still continues to complain of being fatigued and weak globally. She ambulated in the hall twice with her son.   OBJECTIVE Temp:  [97.9 F (36.6 C)-98.6 F (37 C)] 97.9 F (36.6 C) (03/13 0640) Pulse Rate:  [54-66] 66 (03/13 0640) Cardiac Rhythm:  [-] Sinus bradycardia (03/12 2330) Resp:  [14-23] 18 (03/13 0640) BP: (101-163)/(39-87) 140/54 mmHg (03/13 0640) SpO2:  [93 %-97 %] 97 % (03/13 0640) Weight:  [61.916 kg (136 lb 8 oz)] 61.916 kg (136 lb 8 oz) (03/12 2259)  No results for input(s): GLUCAP in the last 168 hours.  Recent Labs Lab 04/14/14 1830  NA 143  K 3.5  CL 108  CO2 28  GLUCOSE 103*  BUN 8  CREATININE 0.93  CALCIUM 9.4   No results for input(s): AST, ALT, ALKPHOS, BILITOT, PROT, ALBUMIN in the last 168 hours.  Recent Labs Lab 04/14/14 1830  WBC 7.0  NEUTROABS 4.3  HGB 13.5  HCT 40.9  MCV 88.7  PLT 187    Recent Labs Lab 04/14/14 1830  TROPONINI <0.03   No results for input(s): LABPROT, INR in the last 72 hours.  Recent Labs  04/14/14 1913  COLORURINE YELLOW  LABSPEC 1.008  PHURINE 6.0  GLUCOSEU NEGATIVE  HGBUR NEGATIVE  BILIRUBINUR NEGATIVE  KETONESUR NEGATIVE  PROTEINUR NEGATIVE  UROBILINOGEN 0.2  NITRITE NEGATIVE  LEUKOCYTESUR TRACE*       Component Value Date/Time   CHOL 103 04/15/2014 0625   TRIG 149 04/15/2014 0625   HDL 28* 04/15/2014 0625   CHOLHDL 3.7 04/15/2014 0625   VLDL 30 04/15/2014 0625   LDLCALC 45 04/15/2014 0625   Lab Results  Component Value Date   HGBA1C 5.9* 11/14/2012   No results found for: LABOPIA, COCAINSCRNUR, LABBENZ, AMPHETMU, THCU, LABBARB  No results for input(s): ETH in the last 168 hours.  Ct Head Wo Contrast 04/14/2014    1. No acute intracranial abnormalities.  2. Mild cerebral atrophy with extensive chronic microvascular ischemic changes in the cerebral white matter redemonstrated, as above.       Mr Brain Wo Contrast 04/15/2014    No acute ischemia.   Increasing small area of RIGHT occipital cortical encephalomalacia suggesting progressed RIGHT posterior cerebral artery territory infarct.   Moderate to severe white matter changes suggest chronic small vessel ischemic disease.  Remote lacunar infarcts, small RIGHT cerebellar infarct.       PHYSICAL EXAM  GENERAL: This is a very pleasant female in no acute distress.  HEENT: Unremarkable.  ABDOMEN: soft  EXTREMITIES: No edema   BACK:Unremarkable.  SKIN: Normal  by inspection.    MENTAL STATUS: Alert and oriented. Speech, language and cognition are generally intact. Judgment and insight normal.   CRANIAL NERVES: Pupils are equal, round and reactive to light and accomodation; extra ocular movements are full, there is no significant nystagmus; visual fields are full; upper and lower facial muscles are normal in strength and symmetric, there is mild flattening of the nasolabial fold-R; tongue is midline; uvula is midline; shoulder elevation is normal.  MOTOR: Normal tone, bulk and strength; no  pronator drift.  COORDINATION: Left finger to nose is normal, right finger to nose is normal, No rest tremor; no intention tremor; no postural tremor; no bradykinesia.   GAIT: Normal.   Brain MRI is reviewed in person. There is moderate global atrophy. There is moderate confluent and deep white matter leukoencephalopathy. No acute infarcts or lesions are noted.    ASSESSMENT/PLAN [name] is a 81 y.o. female with history of hypertension, a central retinal artery occlusion in right eye, chronic kidney disease and aortic aneurysm,  presenting with transient speech output difficulties. She did not receive IV t-PA due to resolution of deficits.   Stroke:  Non-dominant infarct embolic-TIA.  Resultant  - Transient aphasia.  MRI  Increasing small area of RIGHT occipital cortical encephalomalacia suggesting progressed RIGHT posterior  cerebral artery territory infarct.    MRA  not performed  Carotid Doppler pending  2D Echo pending  LDL 45  HgbA1c pending  Lovenox for VTE prophylaxis  Diet Heart with thin liquids  clopidogrel 75 mg orally every day prior to admission, now on clopidogrel 75 mg orally every day   Patient counseled to be compliant with her antithrombotic medications  Ongoing aggressive stroke risk factor management  Therapy recommendations: Home health physical therapy with 24 per day supervision  Disposition:  Pending  Hypertension  Home meds: Norvasc  Stable   Hyperlipidemia  Home meds: Crestor and Zetia ( resumed in hospital )  LDL 45, goal < 70  Continue statin at discharge    Other Stroke Risk Factors  Advanced age  Cigarette smoker, advised to stop smoking.  Stroke / TIA history   Other Active Problems    Other Pertinent History  Legally blind in the right eye  Hospital day #   Mikey Bussing PA-C [facility] Pager [phone] 04/15/2014, 8:28 AM  The patient was seen and examined by me; notes,  chart and tests reviewed and discussed with midlevel provider, other providers and patient.    To contact Stroke Continuity provider, please refer to [email or web address] After hours, contact General Neurology

## 2014-04-16 ENCOUNTER — Observation Stay (HOSPITAL_COMMUNITY): Payer: Medicare Other

## 2014-04-16 DIAGNOSIS — I6340 Cerebral infarction due to embolism of unspecified cerebral artery: Secondary | ICD-10-CM | POA: Diagnosis not present

## 2014-04-16 DIAGNOSIS — G451 Carotid artery syndrome (hemispheric): Secondary | ICD-10-CM | POA: Diagnosis not present

## 2014-04-16 DIAGNOSIS — I639 Cerebral infarction, unspecified: Secondary | ICD-10-CM | POA: Insufficient documentation

## 2014-04-16 DIAGNOSIS — R002 Palpitations: Secondary | ICD-10-CM | POA: Diagnosis not present

## 2014-04-16 DIAGNOSIS — I6523 Occlusion and stenosis of bilateral carotid arteries: Secondary | ICD-10-CM | POA: Diagnosis not present

## 2014-04-16 DIAGNOSIS — I10 Essential (primary) hypertension: Secondary | ICD-10-CM | POA: Diagnosis not present

## 2014-04-16 DIAGNOSIS — R4789 Other speech disturbances: Secondary | ICD-10-CM | POA: Diagnosis not present

## 2014-04-16 DIAGNOSIS — G459 Transient cerebral ischemic attack, unspecified: Secondary | ICD-10-CM | POA: Diagnosis not present

## 2014-04-16 DIAGNOSIS — R42 Dizziness and giddiness: Secondary | ICD-10-CM | POA: Diagnosis not present

## 2014-04-16 DIAGNOSIS — I6789 Other cerebrovascular disease: Secondary | ICD-10-CM | POA: Diagnosis not present

## 2014-04-16 DIAGNOSIS — E785 Hyperlipidemia, unspecified: Secondary | ICD-10-CM | POA: Diagnosis not present

## 2014-04-16 LAB — URINE CULTURE: Colony Count: 80000

## 2014-04-16 LAB — HEMOGLOBIN A1C
Hgb A1c MFr Bld: 5.7 % — ABNORMAL HIGH (ref 4.8–5.6)
Mean Plasma Glucose: 117 mg/dL

## 2014-04-16 MED ORDER — GADOBENATE DIMEGLUMINE 529 MG/ML IV SOLN
12.0000 mL | Freq: Once | INTRAVENOUS | Status: AC | PRN
  Administered 2014-04-16: 12 mL via INTRAVENOUS

## 2014-04-16 NOTE — Progress Notes (Signed)
PT Cancellation Note  Patient Details Name: ELSPETH BLUCHER MRN: [id number] DOB: 04-10-1926   Cancelled Treatment:    Reason Eval/Treat Not Completed: Patient at procedure or test/unavailable (pt at MRI per RN) PT will return when appropriate and pt able to participate in therapy.  Joslyn Hy PT, DPT [phone] 04/16/2014, 4:28 PM

## 2014-04-16 NOTE — Progress Notes (Signed)
Talked to patient about [facility] choices, patient refused HHPT at this time stated " I don't think that I need that." CM informed patient that if she changed her mind after discharge and wanted HHPT her PCP can make arrangements; Mindi Slicker RN,BSN,MHA [phone]

## 2014-04-16 NOTE — Progress Notes (Signed)
Occupational Therapy Evaluation Patient Details Name: Allison [name] MRN: [id number] DOB: January 10, 1927 Today's Date: 04/16/2014    History of Present Illness  82 y.o. female history of hypertension, a, central retinal artery occlusion in right eyel chronic kidney disease and aortic aneurysm, resulting with a history of transient speech output difficulty 04/13/2014. Symptoms resolved after about 5 minutes.  NIH stroke score was 0. MRI -increasing areas of R ocipital corticle encephalomalacia suggesting progressed R posterior cerebral artery infarct.    Clinical Impression   PTA, pt independent with all ADL and mobility. Pt appears close to baseline status regarding ADL and funcitonal mobility for ADL. Feel pt appropriate for D/C home when medically stable with 24/7 S.     Follow Up Recommendations  No OT follow up;Supervision/Assistance - 24 hour    Equipment Recommendations  None recommended by OT    Recommendations for Other Services       Precautions / Restrictions Precautions Precautions: Fall Restrictions Weight Bearing Restrictions: No      Mobility Bed Mobility Overal bed mobility: Modified Independent                Transfers Overall transfer level: Needs assistance   Transfers: Sit to/from Stand Sit to Stand: Supervision  Pt withimproved performance as compared to earlier PT session.            Balance             Standing balance-Leahy Scale: Fair    No LOB noted during ADL session.             High Level Balance Comments: completed ADL task with S only            ADL Overall ADL's : At baseline                                             Vision Vision Assessment?: Yes Eye Alignment: Within Functional Limits Ocular Range of Motion: Within Functional Limits Alignment/Gaze Preference: Within Defined Limits Tracking/Visual Pursuits: Decreased smoothness of horizontal tracking Saccades: Additional eye shifts  occurred during testing Convergence: Impaired (comment) Visual Fields: Left visual field deficit;Impaired-to be further tested in functional context Additional Comments: Hx of R eye blindness. Appears to have a left field cut.    Perception     Praxis Praxis Praxis tested?: Within functional limits    Pertinent Vitals/Pain  no c/o pain     Hand Dominance Right   Extremity/Trunk Assessment Upper Extremity Assessment Upper Extremity Assessment:  (Hx of B shoulder surgery. B ROM WFL for ADL)   Lower Extremity Assessment Lower Extremity Assessment: Defer to PT evaluation   Cervical / Trunk Assessment Cervical / Trunk Assessment: Normal   Communication Communication Communication: No difficulties   Cognition Arousal/Alertness: Awake/alert Behavior During Therapy: WFL for tasks assessed/performed Overall Cognitive Status: Within Functional Limits for tasks assessed                     General Comments       Exercises       Shoulder Instructions      Home Living Family/patient expects to be discharged to:: Private residence Living Arrangements: Spouse/significant other Available Help at Discharge: Family;Available 24 hours/day Type of Home: House Home Access: Stairs to enter;Ramped entrance Entrance Stairs-Number of Steps: 2 Entrance Stairs-Rails: Right Home Layout: One level  Bathroom Shower/Tub: Occupational psychologist: Standard Bathroom Accessibility: Yes How Accessible: Accessible via walker Home Equipment: [place] - 2 wheels;Cane - single point;Grab bars - tub/shower   Additional Comments: States husband walks with a cane as well.       Prior Functioning/Environment Level of Independence: Independent        Comments: States her and husband manage well at home.     OT Diagnosis: Generalized weakness   OT Problem List: Decreased strength;Decreased activity tolerance   OT Treatment/Interventions:      OT Goals(Current goals  can be found in the care plan section) Acute Rehab OT Goals Patient Stated Goal: to go home OT Goal Formulation: All assessment and education complete, DC therapy  OT Frequency:     Barriers to D/C:            Co-evaluation              End of Session Equipment Utilized During Treatment: Gait belt Nurse Communication: Mobility status  Activity Tolerance: Patient tolerated treatment well Patient left: in chair;with call bell/phone within reach   Time: 1324-1351 OT Time Calculation (min): 27 min Charges:  OT General Charges $OT Visit: 1 Procedure OT Evaluation $Initial OT Evaluation Tier I: 1 Procedure OT Treatments $Self Care/Home Management : 8-22 mins G-Codes: OT G-codes **NOT FOR INPATIENT CLASS** Functional Assessment Tool Used: clinical judgement Functional Limitation: Self care Self Care Current Status (N8676): At least 1 percent but less than 20 percent impaired, limited or restricted Self Care Goal Status (H2094): At least 1 percent but less than 20 percent impaired, limited or restricted Self Care Discharge Status [phone]): At least 1 percent but less than 20 percent impaired, limited or restricted  Juanpablo Ciresi,HILLARY 04/16/2014, 2:13 PM   [facility], OTR/L  [phone] 04/16/2014

## 2014-04-16 NOTE — Progress Notes (Signed)
UR completed 

## 2014-04-16 NOTE — Progress Notes (Signed)
*  PRELIMINARY RESULTS* Echocardiogram 2D Echocardiogram has been performed.  Leavy Cella 04/16/2014, 9:25 AM

## 2014-04-16 NOTE — Evaluation (Signed)
Speech Language Pathology Evaluation Patient Details Name: Allison [name] MRN: [id number] DOB: 1926/04/15 Today's Date: 04/16/2014 Time: 9485-4627 SLP Time Calculation (min) (ACUTE ONLY): 15 min  Problem List:  Patient Active Problem List   Diagnosis Date Noted  . Cerebral infarction due to unspecified mechanism   . TIA (transient ischemic attack) 04/15/2014  . Weakness 04/14/2014  . CVA (cerebral infarction) 04/14/2014  . Dizziness and giddiness   . Dysuria 10/08/2011  . HTN (hypertension) 10/07/2011  . Embolic stroke 03/50/0938  . acute Blindness  right eye 10/07/2011  . Hyperlipemia 10/07/2011  . Depression 10/07/2011  . PUD (peptic ulcer disease) 10/07/2011  . Vasculopathy 10/07/2011  . Tobacco abuse 10/07/2011  . Retinal artery branch occlusion of right eye 10/07/2011  . Internal hemorrhoids   . Cholelithiasis    Past Medical History:  Past Medical History  Diagnosis Date  . HTN (hypertension)   . Aortic aneurysm   . Transient ischemic attack   . Murmur, cardiac   . Cholelithiasis   . Internal hemorrhoids   . Irritable bowel syndrome   . Depression   . Eustachian tube dysfunction     pt denies  . Appendicitis   . CVA (cerebral infarction)     Right retinal branch occlusion  . Carotid artery plaque     hx of per left side   . Stroke   . Legally blind in right eye, as defined in [place]     related to past CVA  . Anxiety   . Chronic kidney disease     hx of kidney stones  . GERD (gastroesophageal reflux disease)    Past Surgical History:  Past Surgical History  Procedure Laterality Date  . Abdominal aortic aneurysm repair  2005  . Kidney stones removed    . Appendectomy    . Abdominal hysterectomy    . Cholecystectomy    . Shoulder surgery      Left  . Wrist surgery      Right  . Tee without cardioversion  12/18/2011    Procedure: TRANSESOPHAGEAL ECHOCARDIOGRAM (TEE);  Surgeon: Larey Dresser, MD;  Location: [place];  Service: Cardiovascular;   Laterality: N/A;  . Eye surgery      hx of lazy eye    HPI:  81 y.o. female with a history of CVA (09/2012) and hx of TIAs with complaints of sudden onset of slurring of speech.  MRI no acute large vascular territory infarct on this mildly motion degraded examination   Assessment / Plan / Recommendation Clinical Impression  Pt demonstrated functional speech-language-cognitive ability without deficits. Pt without compliants re: communication or cognitive abilities. Speech clear and intelligible. No ST warranted at this time.    SLP Assessment  Patient does not need any further Speech Lanaguage Pathology Services    Follow Up Recommendations  None    Frequency and Duration        Pertinent Vitals/Pain Pain Assessment: No/denies pain    SLP Evaluation Prior Functioning  Cognitive/Linguistic Baseline: Within functional limits Type of Home: House  Lives With: Spouse Available Help at Discharge: Family;Available 24 hours/day   Cognition  Overall Cognitive Status: Within Functional Limits for tasks assessed Safety/Judgment: Appears intact    Comprehension  Auditory Comprehension Overall Auditory Comprehension: Appears within functional limits for tasks assessed Visual Recognition/Discrimination Discrimination: Not tested Reading Comprehension Reading Status: Within funtional limits (visual disturbance from prior TIA)    Expression Expression Primary Mode of Expression: Verbal Verbal Expression Overall Verbal Expression: Appears  within functional limits for tasks assessed Written Expression Dominant Hand: Right Written Expression: Within Functional Limits   Oral / Motor Oral Motor/Sensory Function Overall Oral Motor/Sensory Function: Appears within functional limits for tasks assessed Motor Speech Overall Motor Speech: Appears within functional limits for tasks assessed   GO     [name] 04/16/2014, 3:12 PM  Orbie Pyo Antionetta Ator M.Ed [employer]  [phone]

## 2014-04-16 NOTE — Progress Notes (Signed)
[facility] PROGRESS NOTE  Amarys Sliwinski Kanner DEY:[id number] DOB: 1926-08-27 DOA: 04/14/2014 PCP: Horatio Pel, MD  Assessment/Plan: 1. TIA/Speech disturbance and Dizziness -transient, resolved -MRI negative -2D ECHO done, results pending -Carotid duplex :Bilateral: Mild mixed irregular plaque in CCA and ICA and left vertebral retrograde -LDL: 45, continue statin -Pt/OT evals comepleted, HH PT recommended -continue plavix was on this prior to admission  2. H/O CRAO  3. HTN -BP soft, hold norvasc  4. Depression/anxiety -continue lexapro and celexa  Code Status: Full Code Family Communication:none at bedside Disposition Plan: DC home later today   Consultants:  Neuro  HPI/Subjective: No complaints, speech back to normal, dizziness resolved  Objective: Filed Vitals:   04/16/14 0524  BP: 125/55  Pulse: 56  Temp: 98.1 F (36.7 C)  Resp: 20   No intake or output data in the 24 hours ending 04/16/14 0934 Filed Weights   04/14/14 2259  Weight: 61.916 kg (136 lb 8 oz)    Exam:   General:  AAOx3  Cardiovascular: S1S2/RRR  Respiratory: CTAB  Abdomen: soft, NT, BS present  Musculoskeletal: no edema c/c  Neuro: non focal  Data Reviewed: Basic Metabolic Panel:  Recent Labs Lab 04/14/14 1830  NA 143  K 3.5  CL 108  CO2 28  GLUCOSE 103*  BUN 8  CREATININE 0.93  CALCIUM 9.4   Liver Function Tests: No results for input(s): AST, ALT, ALKPHOS, BILITOT, PROT, ALBUMIN in the last 168 hours. No results for input(s): LIPASE, AMYLASE in the last 168 hours. No results for input(s): AMMONIA in the last 168 hours. CBC:  Recent Labs Lab 04/14/14 1830  WBC 7.0  NEUTROABS 4.3  HGB 13.5  HCT 40.9  MCV 88.7  PLT 187   Cardiac Enzymes:  Recent Labs Lab 04/14/14 1830  TROPONINI <0.03   BNP (last 3 results) No results for input(s): BNP in the last 8760 hours.  ProBNP (last 3 results) No results for input(s): PROBNP in the last 8760  hours.  CBG: No results for input(s): GLUCAP in the last 168 hours.  Recent Results (from the past 240 hour(s))  Urine culture     Status: None   Collection Time: 04/14/14  7:13 PM  Result Value Ref Range Status   Specimen Description URINE, CLEAN CATCH  Final   Special Requests NONE  Final   Colony Count   Final    80,000 COLONIES/ML Performed at [facility]    Culture   Final    Multiple bacterial morphotypes present, none predominant. Suggest appropriate recollection if clinically indicated. Performed at [facility]    Report Status 04/16/2014 FINAL  Final     Studies: Ct Head Wo Contrast  04/14/2014   CLINICAL DATA:  81 year old female with slurred speech, dizziness and generalized weakness since yesterday.  EXAM: CT HEAD WITHOUT CONTRAST  TECHNIQUE: Contiguous axial images were obtained from the base of the skull through the vertex without intravenous contrast.  COMPARISON:  Head CT 10/07/2011.  Brain MRI 06/2011.  FINDINGS: Mild cerebral atrophy. Patchy and confluent areas of decreased attenuation are noted throughout the deep and periventricular white matter of the cerebral hemispheres bilaterally (left greater than right), compatible with chronic microvascular ischemic disease. No acute intracranial abnormalities. Specifically, no evidence of acute intracranial hemorrhage, no definite findings of acute/subacute cerebral ischemia, no mass, mass effect, hydrocephalus or abnormal intra or extra-axial fluid collections. Visualized paranasal sinuses and mastoids are well pneumatized. No acute displaced skull fractures are identified.  IMPRESSION: 1.  No acute intracranial abnormalities. 2. Mild cerebral atrophy with extensive chronic microvascular ischemic changes in the cerebral white matter redemonstrated, as above.   Electronically Signed   By: Vinnie Langton M.D.   On: 04/14/2014 19:40   Mr Jodene Nam Head Wo Contrast  04/16/2014   CLINICAL DATA:  Transient speech  difficulty April 13, 2014, weakness. History central retinal artery occlusion, kidney disease, hypertension, aortic aneurysm.  EXAM: MRA HEAD WITHOUT CONTRAST  TECHNIQUE: Angiographic images of the Circle of Willis were obtained using MRA technique without intravenous contrast.  COMPARISON:  MRI of the head April 14, 2013 and MRA of the head October 08, 2011  FINDINGS: Mild motion degraded examination.  Anterior circulation: Normal flow related enhancement of the included cervical, petrous, cavernous and supra clinoid internal carotid arteries. Patent anterior communicating artery. Normal flow related enhancement of the anterior cerebral arteries, including more distal segments. Relative asymmetry of middle cerebral arteries, with decrease mid to distal RIGHT MCA segments.  No large vessel occlusion, high-grade stenosis, aneurysm.  Posterior circulation: RIGHT vertebral artery is dominant. Poor flow related enhancement of LEFT vertebral artery, unchanged. Basilar artery is patent, with normal flow related enhancement of the main branch vessels. Revised a RIGHT posterior communicating artery with compensatory diminutive RIGHT P1 segment. Normal flow related enhancement of the posterior cerebral arteries.  No large vessel occlusion, aneurysm.  IMPRESSION: No acute large vascular territory infarct on this mildly motion degraded examination.  Stable asymmetry of LEFT vertebral artery, which may reflect slow flow/proximal stenosis.  Relatively decreased number of mid to distal RIGHT middle cerebral artery branches which could reflect artifact, atherosclerosis, less likely occlusion.   Electronically Signed   By: Elon Alas   On: 04/16/2014 02:38   Mr Brain Wo Contrast  04/15/2014   CLINICAL DATA:  Speech difficulty a beginning at 1630 hours yesterday, subsequent weakness. On Plavix, history of hypertension, retinal artery occlusion, kidney disease, aortic aneurysm. Assess for acute stroke.  EXAM: MRI HEAD  WITHOUT CONTRAST  TECHNIQUE: Multiplanar, multiecho pulse sequences of the brain and surrounding structures were obtained without intravenous contrast.  COMPARISON:  CT of the head April 14, 2014 and MRI of the brain December 08, 2011  FINDINGS: No reduced diffusion to suggest acute ischemia. No susceptibility artifact to suggest hemorrhage.  Moderate ventriculomegaly, likely on the basis of global parenchymal brain volume loss as there is overall commensurate enlargement of cerebral sulci and cerebellar folia, unchanged. Sub cm foci of FLAIR hyperintense signal on the RIGHT occipital cortex, increased from prior imaging. Patchy to confluent supratentorial pontine white matter FLAIR T2 hyperintensities, similar. Remote small RIGHT cerebellar infarcts. Tiny T2 hyperintensities in the basal ganglia and thalamus are unchanged. No midline shift, mass effect or mass lesions.  No abnormal extra-axial fluid collections. Sulcal ectatic intracranial vessels, vascular flow voids seen at the skull base.  Ocular globes and orbital contents are unremarkable. Trace maxillary sinus mucosal thickening without air-fluid levels. No abnormal sellar expansion. Probable pannus about the odontoid process effaces the ventral thecal sac, unchanged. Patient is edentulous.  IMPRESSION: No acute ischemia.  Increasing small area of RIGHT occipital cortical encephalomalacia suggesting progressed RIGHT posterior cerebral artery territory infarct.  Moderate to severe white matter changes suggest chronic small vessel ischemic disease. Remote lacunar infarcts, small RIGHT cerebellar infarct.   Electronically Signed   By: Elon Alas   On: 04/15/2014 00:51    Scheduled Meds: . amLODipine  5 mg Oral q morning - 10a  . cholecalciferol  1,000 Units  Oral q morning - 10a  . clopidogrel  75 mg Oral q morning - 10a  . cycloSPORINE  1 drop Both Eyes BID  . enoxaparin (LOVENOX) injection  30 mg Subcutaneous Q24H  . escitalopram  5 mg Oral q  morning - 10a  . ezetimibe  5 mg Oral Daily  . OLANZapine  5 mg Oral QHS  . pantoprazole  40 mg Oral q morning - 10a  . rosuvastatin  10 mg Oral QHS   Continuous Infusions:  Antibiotics Given (last 72 hours)    None      Principal Problem:   CVA (cerebral infarction) Active Problems:   HTN (hypertension)   Embolic stroke   Hyperlipemia   Retinal artery branch occlusion of right eye   Weakness   Dizziness and giddiness   TIA (transient ischemic attack)    Time spent: 57min    Brenae Lasecki  [facility] Pager [phone]. If 7PM-7AM, please contact night-coverage at [email or web address], password [facility] 04/16/2014, 9:34 AM

## 2014-04-16 NOTE — Progress Notes (Signed)
STROKE TEAM PROGRESS NOTE   HISTORY Allison [name] is an 81 y.o. female history of hypertension, a central retinal artery occlusion in right eye, chronic kidney disease and aortic aneurysm, presenting with a history of transient speech output difficulty started at 1630 on 04/13/2014. Symptoms resolved after about 5 minutes. She felt weak all over but had no disproportionate focal weakness. She knew what she wanted to say and could understand what was being said to her but had difficulty with output. She has been taking Plavix for antiplatelet therapy. She has not had a recurrence of symptoms. NIH stroke score at the time of this evaluation was 0. Patient is being admitted for stroke risk assessment as well as to rule out acute stroke.  LSN: 4:30 PM on 04/13/2014 tPA Given: No: Deficits rapidly resolved MRankin: 0   SUBJECTIVE (INTERVAL HISTORY) Nurse reports ongoing low blood pressures in the left arm, concern for subclavian steal. She has discussed with Dr. Broadus John. Patient reports right eye blindness which is old due to CRAO. Her transient word finding difficulties from admission have resolved.  OBJECTIVE Temp:  [98.1 F (36.7 C)-98.7 F (37.1 C)] 98.2 F (36.8 C) (03/14 1021) Pulse Rate:  [56-71] 59 (03/14 1021) Cardiac Rhythm:  [-]  Resp:  [16-20] 16 (03/14 1021) BP: (106-155)/(44-71) 155/59 mmHg (03/14 1021) SpO2:  [94 %-96 %] 94 % (03/14 1021)  No results for input(s): GLUCAP in the last 168 hours.  Recent Labs Lab 04/14/14 1830  NA 143  K 3.5  CL 108  CO2 28  GLUCOSE 103*  BUN 8  CREATININE 0.93  CALCIUM 9.4   No results for input(s): AST, ALT, ALKPHOS, BILITOT, PROT, ALBUMIN in the last 168 hours.  Recent Labs Lab 04/14/14 1830  WBC 7.0  NEUTROABS 4.3  HGB 13.5  HCT 40.9  MCV 88.7  PLT 187    Recent Labs Lab 04/14/14 1830  TROPONINI <0.03   No results for input(s): LABPROT, INR in the last 72 hours.  Recent Labs  04/14/14 1913  COLORURINE YELLOW   LABSPEC 1.008  PHURINE 6.0  GLUCOSEU NEGATIVE  HGBUR NEGATIVE  BILIRUBINUR NEGATIVE  KETONESUR NEGATIVE  PROTEINUR NEGATIVE  UROBILINOGEN 0.2  NITRITE NEGATIVE  LEUKOCYTESUR TRACE*       Component Value Date/Time   CHOL 103 04/15/2014 0625   TRIG 149 04/15/2014 0625   HDL 28* 04/15/2014 0625   CHOLHDL 3.7 04/15/2014 0625   VLDL 30 04/15/2014 0625   LDLCALC 45 04/15/2014 0625   Lab Results  Component Value Date   HGBA1C 5.7* 04/15/2014   No results found for: LABOPIA, COCAINSCRNUR, LABBENZ, AMPHETMU, THCU, LABBARB  No results for input(s): ETH in the last 168 hours.  Ct Head Wo Contrast 04/14/2014    1. No acute intracranial abnormalities.  2. Mild cerebral atrophy with extensive chronic microvascular ischemic changes in the cerebral white matter redemonstrated, as above.     Mr Brain Wo Contrast 04/15/2014    No acute ischemia.   Increasing small area of RIGHT occipital cortical encephalomalacia suggesting progressed RIGHT posterior cerebral artery territory infarct.   Moderate to severe white matter changes suggest chronic small vessel ischemic disease.  Remote lacunar infarcts, small RIGHT cerebellar infarct.     MRA brain 04/16/2014 No acute large vascular territory infarct on this mildly motion degraded examination. Stable asymmetry of LEFT vertebral artery, which may reflect slow flow/proximal stenosis. Relatively decreased number of mid to distal RIGHT middle cerebral artery branches which could reflect artifact, atherosclerosis, less  likely occlusion.    MRA neck 04/16/2014 Near occlusion (luminal diameter 1 mm or less) of the left subclavian artery beginning 1.8 cm beyond its origin. Flow ispresent within the left vertebral artery (retrograde by ultrasound). The left vertebral artery is a small disease vessel with multiple stenoses including a severe stenosis at its origin. Beyond that, the left subclavian artery appears widely patent.  Severe atherosclerotic  disease of the right subclavian artery with luminal diameter of 2 mm.  Suspicion of 50% stenosis of the proximal right vertebral artery. This is difficult to evaluate accurately because of tortuosity. Beyond that, the right vertebral artery is a large and widely patent vessel.  No atherosclerotic narrowing at either carotid bifurcation.  Carotid Doppler  Bilateral: Mild mixed irregular plaque in CCA and ICA. 1-39% ICA stenosis. Right: Vertebral artery flow is antegrade. Left: Vertebral artery retrograde.   2D Echocardiogram  EF 60-65 % with no source of embolus.    PHYSICAL EXAM BP left arm 104/46, right arm 151/50 GENERAL: This is a very pleasant female in no acute distress. HEENT: Unremarkable. ABDOMEN: soft EXTREMITIES: No edema  BACK:Unremarkable. SKIN: Normal by inspection. MENTAL STATUS: Alert and oriented. Speech, language and cognition are generally intact. Judgment and insight normal.  CRANIAL NERVES: right eye light perception, left eye visual acuity grossly intact. Pupils are equal, round and reactive to light and accomodation; extra ocular movements are full, there is no significant nystagmus; visual fields are full; upper and lower facial muscles are normal in strength and symmetric, there is mild flattening of the nasolabial fold-L; tongue is midline; uvula is midline; shoulder elevation is normal. MOTOR: Normal tone, bulk and strength; no pronator drift. COORDINATION: Left finger to nose is normal, right finger to nose is normal, No rest tremor; no intention tremor; no postural tremor; no bradykinesia. GAIT: Normal.   ASSESSMENT/PLAN Allison [name] is a 81 y.o. female with history of hypertension, right eye blind due to CRAO, chronic kidney disease and aortic aneurysm,  Smoker presenting with transient word finding difficulties. She did not receive IV t-PA due to resolution of deficits.   TIA with previous lacunar and R cerebellar strokes  Resultant  - Transient  aphasia  MRI  Old RIGHT occipital cortical encephalomalacia d/t progressed RIGHT posterior cerebral artery territory infarct.    MRA head and neck showed left subclavian near occlusion with multiple large vessel athero  Carotid Doppler left vertebral artery retrograde. No ICA stenosis  2D Echo no source of embolus  LDL 45  HgbA1c 5.7, at goal  Lovenox for VTE prophylaxis  Diet Heart with thin liquids  clopidogrel 75 mg orally every day prior to admission, now on clopidogrel 75 mg orally every day . Due to high grade large vessel athero, we recommend dural antiplatelet for 3 months and then plavix alone.   Due to transient aphasia, also recommend to arrange 30 day cardiac monitoring for palpitation to rule out afib.   Patient counseled to be compliant with her antithrombotic medications  Ongoing aggressive stroke risk factor management  Therapy recommendations: Home health physical therapy with 24 per day supervision  Disposition:  Return home. 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 Hx R CROA.  Depression/anxiety on Lexapro and [facility] day #   Neurology will sign off. Please call with questions. Pt will follow up with Dr. Erlinda Hong at [facility] in about 2 months. Thanks for the consult.  Rosalin Hawking, MD PhD Stroke Neurology 04/17/2014 1:04 AM   To contact Stroke  Continuity provider, please refer to [email or web address] After hours, contact General Neurology

## 2014-04-16 NOTE — Progress Notes (Signed)
VASCULAR LAB PRELIMINARY  PRELIMINARY  PRELIMINARY  PRELIMINARY  Carotid duplex  completed.    Preliminary report:  Bilateral:   Mild mixed irregular plaque in CCA and ICA. 1-39% ICA stenosis.  Right:  Vertebral artery flow is antegrade.  Left:  Vertebral artery retrograde.     Allison [name], RVT 04/16/2014, 8:49 AM

## 2014-04-17 ENCOUNTER — Encounter: Payer: Self-pay | Admitting: Vascular Surgery

## 2014-04-17 DIAGNOSIS — I638 Other cerebral infarction: Secondary | ICD-10-CM

## 2014-04-17 DIAGNOSIS — I639 Cerebral infarction, unspecified: Secondary | ICD-10-CM | POA: Diagnosis not present

## 2014-04-17 DIAGNOSIS — G459 Transient cerebral ischemic attack, unspecified: Secondary | ICD-10-CM | POA: Diagnosis not present

## 2014-04-17 DIAGNOSIS — R002 Palpitations: Secondary | ICD-10-CM | POA: Insufficient documentation

## 2014-04-17 DIAGNOSIS — H34231 Retinal artery branch occlusion, right eye: Secondary | ICD-10-CM | POA: Diagnosis not present

## 2014-04-17 MED ORDER — ASPIRIN 81 MG PO TBEC: 81.0000 mg | DELAYED_RELEASE_TABLET | Freq: Every day | ORAL | Status: DC

## 2014-04-17 MED ORDER — ASPIRIN EC 81 MG PO TBEC: 81.0000 mg | DELAYED_RELEASE_TABLET | Freq: Every day | ORAL | Status: DC

## 2014-04-17 NOTE — Progress Notes (Signed)
Physical Therapy Treatment Patient Details Name: Allison [name] MRN: [id number] DOB: 20-Jun-1926 Today's Date: 04/17/2014    History of Present Illness  81 y.o. female history of hypertension, a, central retinal artery occlusion in right eyel chronic kidney disease and aortic aneurysm, resulting with a history of transient speech output difficulty 04/13/2014. Symptoms resolved after about 5 minutes.  NIH stroke score was 0. MRI -increasing areas of R ocipital corticle encephalomalacia suggesting progressed R posterior cerebral artery infarct.     PT Comments    Patient progressing well. Agreeable to use of RW at home. Patient has ramp to enter her house. Husband to assist as needed. Patient planning to DC today and was excited to be going home. Patient safe to D/C from a mobility standpoint based on progression towards goals set on PT eval.    Follow Up Recommendations  Home health PT;Supervision/Assistance - 24 hour     Equipment Recommendations  None recommended by PT    Recommendations for Other Services       Precautions / Restrictions Precautions Precautions: Fall    Mobility  Bed Mobility Overal bed mobility: Modified Independent                Transfers Overall transfer level: Needs assistance     Sit to Stand: Supervision         General transfer comment: Cues for safe technique with use of RW  Ambulation/Gait Ambulation/Gait assistance: Supervision Ambulation Distance (Feet): 300 Feet Assistive device: Rolling walker (2 wheeled) Gait Pattern/deviations: Step-through pattern;Decreased stride length     General Gait Details: Use of RW as patient has one that she can use at home. No LOB noted with use of RW and patient with safe use of AD.    Stairs            Wheelchair Mobility    Modified Rankin (Stroke Patients Only) Modified Rankin (Stroke Patients Only) Pre-Morbid Rankin Score: Moderate disability Modified Rankin: Moderately severe  disability     Balance             Standing balance-Leahy Scale: Fair                      Cognition Arousal/Alertness: Awake/alert Behavior During Therapy: WFL for tasks assessed/performed Overall Cognitive Status: Within Functional Limits for tasks assessed                      Exercises      General Comments        Pertinent Vitals/Pain Pain Assessment: No/denies pain    Home Living                      Prior Function            PT Goals (current goals can now be found in the care plan section) Progress towards PT goals: Progressing toward goals    Frequency  Min 3X/week    PT Plan Current plan remains appropriate    Co-evaluation             End of Session   Activity Tolerance: Patient tolerated treatment well Patient left: in chair;with call bell/phone within reach     Time: 1055-1107 PT Time Calculation (min) (ACUTE ONLY): 12 min  Charges:  $Gait Training: 8-22 mins                    G Codes:  Jacqualyn Posey 04/17/2014, 11:53 AM 04/17/2014 Jacqualyn Posey PTA [phone] pager [phone] office

## 2014-04-17 NOTE — Discharge Instructions (Signed)
Stroke Prevention Some medical conditions and behaviors are associated with an increased chance of having a stroke. You may prevent a stroke by making healthy choices and managing medical conditions. HOW CAN I REDUCE MY RISK OF HAVING A STROKE?   Stay physically active. Get at least 30 minutes of activity on most or all days.  Do not smoke. It may also be helpful to avoid exposure to secondhand smoke.  Limit alcohol use. Moderate alcohol use is considered to be:  No more than 2 drinks per day for men.  No more than 1 drink per day for nonpregnant women.  Eat healthy foods. This involves:  Eating 5 or more servings of fruits and vegetables a day.  Making dietary changes that address high blood pressure (hypertension), high cholesterol, diabetes, or obesity.  Manage your cholesterol levels.  Making food choices that are high in fiber and low in saturated fat, trans fat, and cholesterol may control cholesterol levels.  Take any prescribed medicines to control cholesterol as directed by your health care provider.  Manage your diabetes.  Controlling your carbohydrate and sugar intake is recommended to manage diabetes.  Take any prescribed medicines to control diabetes as directed by your health care provider.  Control your hypertension.  Making food choices that are low in salt (sodium), saturated fat, trans fat, and cholesterol is recommended to manage hypertension.  Take any prescribed medicines to control hypertension as directed by your health care provider.  Maintain a healthy weight.  Reducing calorie intake and making food choices that are low in sodium, saturated fat, trans fat, and cholesterol are recommended to manage weight.  Stop drug abuse.  Avoid taking birth control pills.  Talk to your health care provider about the risks of taking birth control pills if you are over 7 years old, smoke, get migraines, or have ever had a blood clot.  Get evaluated for sleep  disorders (sleep apnea).  Talk to your health care provider about getting a sleep evaluation if you snore a lot or have excessive sleepiness.  Take medicines only as directed by your health care provider.  For some people, aspirin or blood thinners (anticoagulants) are helpful in reducing the risk of forming abnormal blood clots that can lead to stroke. If you have the irregular heart rhythm of atrial fibrillation, you should be on a blood thinner unless there is a good reason you cannot take them.  Understand all your medicine instructions.  Make sure that other conditions (such as anemia or atherosclerosis) are addressed. SEEK IMMEDIATE MEDICAL CARE IF:   You have sudden weakness or numbness of the face, arm, or leg, especially on one side of the body.  Your face or eyelid droops to one side.  You have sudden confusion.  You have trouble speaking (aphasia) or understanding.  You have sudden trouble seeing in one or both eyes.  You have sudden trouble walking.  You have dizziness.  You have a loss of balance or coordination.  You have a sudden, severe headache with no known cause.  You have new chest pain or an irregular heartbeat. Any of these symptoms may represent a serious problem that is an emergency. Do not wait to see if the symptoms will go away. Get medical help at once. Call your local emergency services (911 in [place] Do not drive yourself to the hospital. Document Released: 02/27/2004 Document Revised: 06/05/2013 Document Reviewed: 07/22/2012 [facility] Patient Information 2015 [place], [place]. This information is not intended to replace advice given  to you by your health care provider. Make sure you discuss any questions you have with your health care provider. STROKE/TIA DISCHARGE INSTRUCTIONS SMOKING Cigarette smoking nearly doubles your risk of having a stroke & is the single most alterable risk factor  If you smoke or have smoked in the last 12 months, you are  advised to quit smoking for your health.  Most of the excess cardiovascular risk related to smoking disappears within a year of stopping.  Ask you doctor about anti-smoking medications  [place] Quit Line: 1-800-QUIT NOW  Free Smoking Cessation Classes [phone]  CHOLESTEROL Know your levels; limit fat & cholesterol in your diet  Lipid Panel     Component Value Date/Time   CHOL 103 04/15/2014 0625   TRIG 149 04/15/2014 0625   HDL 28* 04/15/2014 0625   CHOLHDL 3.7 04/15/2014 0625   VLDL 30 04/15/2014 0625   LDLCALC 45 04/15/2014 0625      Many patients benefit from treatment even if their cholesterol is at goal.  Goal: Total Cholesterol (CHOL) less than 160  Goal:  Triglycerides (TRIG) less than 150  Goal:  HDL greater than 40  Goal:  LDL (LDLCALC) less than 100   BLOOD PRESSURE American Stroke Association blood pressure target is less that 120/80 mm/Hg  Your discharge blood pressure is:  BP: (!) 150/55 mmHg  Monitor your blood pressure  Limit your salt and alcohol intake  Many individuals will require more than one medication for high blood pressure  DIABETES (A1c is a blood sugar average for last 3 months) Goal HGBA1c is under 7% (HBGA1c is blood sugar average for last 3 months)  Diabetes: No known diagnosis of diabetes    Lab Results  Component Value Date   HGBA1C 5.7* 04/15/2014     Your HGBA1c can be lowered with medications, healthy diet, and exercise.  Check your blood sugar as directed by your physician  Call your physician if you experience unexplained or low blood sugars.  PHYSICAL ACTIVITY/REHABILITATION Goal is 30 minutes at least 4 days per week  Activity: Increase activity slowly, and Walk with assistance, Therapies: Physical Therapy: Home Health Return to work:   Activity decreases your risk of heart attack and stroke and makes your heart stronger.  It helps control your weight and blood pressure; helps you relax and can improve your  mood.  Participate in a regular exercise program.  Talk with your doctor about the best form of exercise for you (dancing, walking, swimming, cycling).  DIET/WEIGHT Goal is to maintain a healthy weight  Your discharge diet is: Diet Heart Diet - low sodium heart healthy liquids Your height is:  Height: 5\' 3"  (160 cm) Your current weight is: Weight: 61.916 kg (136 lb 8 oz) Your Body Mass Index (BMI) is:  BMI (Calculated): 24.2  Following the type of diet specifically designed for you will help prevent another stroke.  Your goal weight range is:    Your goal Body Mass Index (BMI) is 19-24.  Healthy food habits can help reduce 3 risk factors for stroke:  High cholesterol, hypertension, and excess weight.  RESOURCES Stroke/Support Group:  Call [phone]   STROKE EDUCATION PROVIDED/REVIEWED AND GIVEN TO PATIENT Stroke warning signs and symptoms How to activate emergency medical system (call 911). Medications prescribed at discharge. Need for follow-up after discharge. Personal risk factors for stroke. Pneumonia vaccine given: No Flu vaccine given: No My questions have been answered, the writing is legible, and I understand these instructions.  I  will adhere to these goals & educational materials that have been provided to me after my discharge from the hospital.

## 2014-04-17 NOTE — Progress Notes (Signed)
Pt d/c to home by car with family. Assessment stable. Prescriptions given. Pt verbalizes understanding of d/c instructions.

## 2014-04-17 NOTE — Discharge Summary (Signed)
Physician Discharge Summary  Allison [name] POE:[id number] DOB: 02-Dec-1926 DOA: 04/14/2014  PCP: Horatio Pel, MD  Admit date: 04/14/2014 Discharge date: 04/17/2014  Time spent: 45 minutes  Recommendations for Outpatient Follow-up:  1. [facility] Dr.Charles Fields, Vascular Surgery at 12:15pm 2. Dr.XU with Neurology in 1 month  Discharge Diagnoses:    TIA   Subclavian steal    HTN (hypertension)   Embolic stroke   Hyperlipemia   Retinal artery branch occlusion of right eye   Weakness   Dizziness and giddiness   TIA (transient ischemic attack)   Cerebral infarction due to unspecified mechanism   Palpitations   Discharge Condition: stable  Diet recommendation: heart healthy  Filed Weights   04/14/14 2259  Weight: 61.916 kg (136 lb 8 oz)    History of present illness:  Allison [name] is a 81 y.o. female with a history of an Embolic CVA with Right Retinal Occlusion (09/2012) and hx of TIAs presented to the ED with complaints of sudden onset of slurring of speech which lasted 5 minutes and some dizziness  Hospital Course:   TIA with previous lacunar and R cerebellar strokes  Resultant - Transient aphasia, resolved  MRIshowed Old RIGHT occipital cortical encephalomalacia d/t progressed RIGHT posterior cerebral artery territory infarct.  MRA head and neck showed left subclavian near occlusion with multiple large vessel athero  Carotid Doppler left vertebral artery retrograde. No ICA stenosis  2D Echo no source of embolus  LDL 45  HgbA1c 5.7, at goal  clopidogrel 75 mg orally every day prior to admission, now on clopidogrel 75 mg orally every day  Due to high grade large vessel atherosclerosis Neurology recommended dual anti-platelt therapy for 97months and then plavix alone, and 30day event monitor was recommended for palpitation to r/o afib  Ongoing aggressive stroke risk factor management  Therapy recommendations: Home health physical therapy with 24 per  day supervision  Disposition: Return home. Patient declined home health PT  Hypertension  Home meds: Norvasc  Stable  Hyperlipidemia  Home meds: Crestor and Zetia ( resumed in hospital )  LDL 45, goal < 70  Continue statin at discharge  Subclavian Steal  Blood pressure left arm 104/46  Blood pressure right arm 151/50  Near occlusion of L subclavian per MRA neck  I have made her a FU with [facility] Dr.Dickson tomorrow in the office at 12:15pm  Tobacco abuse  Current smoker  Smoking cessation counseling provided  Pt is willing to quit  Other Stroke Risk Factors  Advanced age  Stroke / TIA history  Other Pertinent History  Legally blind in the right eye. Hx R CROA.  Depression/anxiety on Lexapro and Celexa    Consultations:  Neurology  Discharge Exam: Filed Vitals:   04/17/14 1006  BP: 150/55  Pulse: 54  Temp: 97.9 F (36.6 C)  Resp: 20    General: AAOx3 Cardiovascular: S1S2/RRR Respiratory: CTAB  Discharge Instructions   Discharge Instructions    Ambulatory referral to Neurology    Complete by:  As directed   Pt will follow up with Dr. Erlinda Hong at [facility] in about 2 months. Thanks.     Diet - low sodium heart healthy    Complete by:  As directed      Increase activity slowly    Complete by:  As directed           Discharge Medication List as of 04/17/2014 10:54 AM    START taking these medications   Details  aspirin EC 81  MG EC tablet Take 1 tablet (81 mg total) by mouth daily., Starting 04/18/2014, Until Discontinued, OTC      CONTINUE these medications which have NOT CHANGED   Details  acetaminophen (TYLENOL) 500 MG tablet Take 500 mg by mouth every 6 (six) hours as needed for moderate pain or headache., Until Discontinued, Historical Med    amLODipine (NORVASC) 5 MG tablet Take 5 mg by mouth every morning. , Until Discontinued, Historical Med    Cholecalciferol (VITAMIN D PO) Take 1 tablet by mouth every morning. , Until Discontinued,  Historical Med    clopidogrel (PLAVIX) 75 MG tablet Take 1 tablet by mouth every morning. , Starting 10/28/2012, Until Discontinued, Historical Med    Coenzyme Q10 (CO Q-10) 50 MG CAPS Take 1 capsule by mouth every morning. Pt states takes 30 mgs, Until Discontinued, Historical Med    cycloSPORINE (RESTASIS) 0.05 % ophthalmic emulsion Place 1 drop into both eyes 2 (two) times daily., Until Discontinued, Historical Med    escitalopram (LEXAPRO) 10 MG tablet Take 5 mg by mouth every morning. , Until Discontinued, Historical Med    ezetimibe (ZETIA) 10 MG tablet Take 5 mg by mouth at bedtime. , Until Discontinued, Historical Med    OLANZapine (ZYPREXA) 2.5 MG tablet Take 5 mg by mouth at bedtime. , Until Discontinued, Historical Med    pantoprazole (PROTONIX) 40 MG tablet Take 40 mg by mouth every morning. , Until Discontinued, Historical Med    Polyethyl Glycol-Propyl Glycol (SYSTANE FREE OP) Place 1 drop into both eyes daily as needed (dry eyes). , Until Discontinued, Historical Med    rosuvastatin (CRESTOR) 10 MG tablet Take 10 mg by mouth at bedtime. , Until Discontinued, Historical Med       Allergies  Allergen Reactions  . Shellfish Allergy Shortness Of Breath and Rash    All seafood  . Xylocaine [Lidocaine] Shortness Of Breath  . Ampicillin     unknown  . Biaxin [Clarithromycin]     unknown  . Celebrex [Celecoxib]     Unknown   . Cozaar [Losartan]     High blood pressure  . Demerol [Meperidine]     unknown  . Elavil [Amitriptyline]     unknown  . Fosamax [Alendronate]     unknown  . Hydromorphone     Hives   . Inderal [Propranolol]     unknown  . Ivp Dye [Iodinated Diagnostic Agents]     rash  . Levaquin [Levofloxacin]     unknown  . Macrodantin [Nitrofurantoin]     unknown  . Mycinette [Phenol]     Unknown   . Neggram [Nalidixic Acid]     unknown  . Novocain [Procaine]     unknown  . Pamelor [Nortriptyline]     unknown  . Penicillins     rash  .  Robaxin [Methocarbamol]     unknown  . Tamiflu [Oseltamivir]     Unknown   . Tenormin [Atenolol]     unknown  . Triavil [Perphenazine-Amitriptyline]     Unknown   . Paxil [Paroxetine] Rash  . Wellbutrin [Bupropion] Rash  . Zoloft [Sertraline] Rash   Follow-up Information    Follow up with Xu,Jindong, MD. Schedule an appointment as soon as possible for a visit in 1 month.   Specialty:  Neurology   Why:  stroke clinic   Contact information:   [address] [place] [address] [phone]       Follow up with Ruta Hinds E,  MD On 04/18/2014.   Specialty:  Vascular Surgery   Why:  at 12:15pm,    Contact information:   [place] [address] [phone]        The results of significant diagnostics from this hospitalization (including imaging, microbiology, ancillary and laboratory) are listed below for reference.    Significant Diagnostic Studies: Ct Head Wo Contrast  04/14/2014   CLINICAL DATA:  81 year old female with slurred speech, dizziness and generalized weakness since yesterday.  EXAM: CT HEAD WITHOUT CONTRAST  TECHNIQUE: Contiguous axial images were obtained from the base of the skull through the vertex without intravenous contrast.  COMPARISON:  Head CT 10/07/2011.  Brain MRI 06/2011.  FINDINGS: Mild cerebral atrophy. Patchy and confluent areas of decreased attenuation are noted throughout the deep and periventricular white matter of the cerebral hemispheres bilaterally (left greater than right), compatible with chronic microvascular ischemic disease. No acute intracranial abnormalities. Specifically, no evidence of acute intracranial hemorrhage, no definite findings of acute/subacute cerebral ischemia, no mass, mass effect, hydrocephalus or abnormal intra or extra-axial fluid collections. Visualized paranasal sinuses and mastoids are well pneumatized. No acute displaced skull fractures are identified.  IMPRESSION: 1. No acute intracranial  abnormalities. 2. Mild cerebral atrophy with extensive chronic microvascular ischemic changes in the cerebral white matter redemonstrated, as above.   Electronically Signed   By: Vinnie Langton M.D.   On: 04/14/2014 19:40   Mr Jodene Nam Head Wo Contrast  04/16/2014   CLINICAL DATA:  Transient speech difficulty April 13, 2014, weakness. History central retinal artery occlusion, kidney disease, hypertension, aortic aneurysm.  EXAM: MRA HEAD WITHOUT CONTRAST  TECHNIQUE: Angiographic images of the Circle of Willis were obtained using MRA technique without intravenous contrast.  COMPARISON:  MRI of the head April 14, 2013 and MRA of the head October 08, 2011  FINDINGS: Mild motion degraded examination.  Anterior circulation: Normal flow related enhancement of the included cervical, petrous, cavernous and supra clinoid internal carotid arteries. Patent anterior communicating artery. Normal flow related enhancement of the anterior cerebral arteries, including more distal segments. Relative asymmetry of middle cerebral arteries, with decrease mid to distal RIGHT MCA segments.  No large vessel occlusion, high-grade stenosis, aneurysm.  Posterior circulation: RIGHT vertebral artery is dominant. Poor flow related enhancement of LEFT vertebral artery, unchanged. Basilar artery is patent, with normal flow related enhancement of the main branch vessels. Revised a RIGHT posterior communicating artery with compensatory diminutive RIGHT P1 segment. Normal flow related enhancement of the posterior cerebral arteries.  No large vessel occlusion, aneurysm.  IMPRESSION: No acute large vascular territory infarct on this mildly motion degraded examination.  Stable asymmetry of LEFT vertebral artery, which may reflect slow flow/proximal stenosis.  Relatively decreased number of mid to distal RIGHT middle cerebral artery branches which could reflect artifact, atherosclerosis, less likely occlusion.   Electronically Signed   By: Elon Alas   On: 04/16/2014 02:38   Mr Angiogram Neck W Wo Contrast  04/16/2014   CLINICAL DATA:  Subclavian steal syndrome with left retrograde vertebral artery flow and blood pressure differential in the arms.  EXAM: MRA NECK WITHOUT AND WITH CONTRAST  TECHNIQUE: Multiplanar and multiecho pulse sequences of the neck were obtained without and with intravenous contrast. Angiographic images of the neck were obtained using MRA technique without and with intravenous contrast.  CONTRAST:  76mL MULTIHANCE GADOBENATE DIMEGLUMINE 529 MG/ML IV SOLN  COMPARISON:  No previous neck vascular imaging in Radiology.  FINDINGS: The right innominate origin is widely patent,  though there is atherosclerotic irregularity of the proximal innominate artery. There is atherosclerotic irregularity and stenosis of the right subclavian artery with minimal luminal diameter of 2 mm. There is tortuosity of the proximal right vertebral artery. The origin appears narrowed, estimated at 50% stenosis. Beyond that, the right vertebral artery is widely patent .  The right common carotid artery is widely patent to the bifurcation. No bifurcation stenosis. The right cervical internal carotid artery is tortuous but widely patent.  The left common carotid artery shows 25% narrowing at its origin but is widely patent beyond that to the bifurcation. The carotid bifurcation is widely patent. The left cervical internal carotid artery is widely patent.  The left subclavian artery is widely patent at its origin. 1.8 cm beyond its origin, the vessel is severely stenotic cuff with a lumen of 1 mm. The left vertebral artery is a small vessel which appears to contain multiple stenoses. The left vertebral artery origin is severely stenotic. Beyond that, the left subclavian artery appears widely patent, though it shows atherosclerotic irregularity.  IMPRESSION: Near occlusion (luminal diameter 1 mm or less) of the left subclavian artery beginning 1.8 cm beyond its  origin. Flow is present within the left vertebral artery (retrograde by ultrasound). The left vertebral artery is a small disease vessel with multiple stenoses including a severe stenosis at its origin. Beyond that, the left subclavian artery appears widely patent.  Severe atherosclerotic disease of the right subclavian artery with luminal diameter of 2 mm.  Suspicion of 50% stenosis of the proximal right vertebral artery. This is difficult to evaluate accurately because of tortuosity. Beyond that, the right vertebral artery is a large and widely patent vessel.  No atherosclerotic narrowing at either carotid bifurcation.   Electronically Signed   By: Nelson Chimes M.D.   On: 04/16/2014 16:36   Mr Brain Wo Contrast  04/15/2014   CLINICAL DATA:  Speech difficulty a beginning at 1630 hours yesterday, subsequent weakness. On Plavix, history of hypertension, retinal artery occlusion, kidney disease, aortic aneurysm. Assess for acute stroke.  EXAM: MRI HEAD WITHOUT CONTRAST  TECHNIQUE: Multiplanar, multiecho pulse sequences of the brain and surrounding structures were obtained without intravenous contrast.  COMPARISON:  CT of the head April 14, 2014 and MRI of the brain December 08, 2011  FINDINGS: No reduced diffusion to suggest acute ischemia. No susceptibility artifact to suggest hemorrhage.  Moderate ventriculomegaly, likely on the basis of global parenchymal brain volume loss as there is overall commensurate enlargement of cerebral sulci and cerebellar folia, unchanged. Sub cm foci of FLAIR hyperintense signal on the RIGHT occipital cortex, increased from prior imaging. Patchy to confluent supratentorial pontine white matter FLAIR T2 hyperintensities, similar. Remote small RIGHT cerebellar infarcts. Tiny T2 hyperintensities in the basal ganglia and thalamus are unchanged. No midline shift, mass effect or mass lesions.  No abnormal extra-axial fluid collections. Sulcal ectatic intracranial vessels, vascular flow  voids seen at the skull base.  Ocular globes and orbital contents are unremarkable. Trace maxillary sinus mucosal thickening without air-fluid levels. No abnormal sellar expansion. Probable pannus about the odontoid process effaces the ventral thecal sac, unchanged. Patient is edentulous.  IMPRESSION: No acute ischemia.  Increasing small area of RIGHT occipital cortical encephalomalacia suggesting progressed RIGHT posterior cerebral artery territory infarct.  Moderate to severe white matter changes suggest chronic small vessel ischemic disease. Remote lacunar infarcts, small RIGHT cerebellar infarct.   Electronically Signed   By: Elon Alas   On: 04/15/2014 00:51  Microbiology: Recent Results (from the past 240 hour(s))  Urine culture     Status: None   Collection Time: 04/14/14  7:13 PM  Result Value Ref Range Status   Specimen Description URINE, CLEAN CATCH  Final   Special Requests NONE  Final   Colony Count   Final    80,000 COLONIES/ML Performed at [facility]    Culture   Final    Multiple bacterial morphotypes present, none predominant. Suggest appropriate recollection if clinically indicated. Performed at [facility]    Report Status 04/16/2014 FINAL  Final     Labs: Basic Metabolic Panel:  Recent Labs Lab 04/14/14 1830  NA 143  K 3.5  CL 108  CO2 28  GLUCOSE 103*  BUN 8  CREATININE 0.93  CALCIUM 9.4   Liver Function Tests: No results for input(s): AST, ALT, ALKPHOS, BILITOT, PROT, ALBUMIN in the last 168 hours. No results for input(s): LIPASE, AMYLASE in the last 168 hours. No results for input(s): AMMONIA in the last 168 hours. CBC:  Recent Labs Lab 04/14/14 1830  WBC 7.0  NEUTROABS 4.3  HGB 13.5  HCT 40.9  MCV 88.7  PLT 187   Cardiac Enzymes:  Recent Labs Lab 04/14/14 1830  TROPONINI <0.03   BNP: BNP (last 3 results) No results for input(s): BNP in the last 8760 hours.  ProBNP (last 3 results) No results for  input(s): PROBNP in the last 8760 hours.  CBG: No results for input(s): GLUCAP in the last 168 hours.     SignedDomenic Polite  [facility] 04/17/2014, 3:07 PM

## 2014-04-18 ENCOUNTER — Encounter: Payer: Self-pay | Admitting: *Deleted

## 2014-04-18 ENCOUNTER — Encounter (INDEPENDENT_AMBULATORY_CARE_PROVIDER_SITE_OTHER): Payer: Medicare Other

## 2014-04-18 ENCOUNTER — Ambulatory Visit (INDEPENDENT_AMBULATORY_CARE_PROVIDER_SITE_OTHER): Payer: Medicare Other | Admitting: Vascular Surgery

## 2014-04-18 ENCOUNTER — Encounter: Payer: Self-pay | Admitting: Vascular Surgery

## 2014-04-18 VITALS — BP 113/60 | HR 73 | Resp 18 | Ht 60.5 in | Wt 136.6 lb

## 2014-04-18 DIAGNOSIS — I708 Atherosclerosis of other arteries: Secondary | ICD-10-CM

## 2014-04-18 DIAGNOSIS — G459 Transient cerebral ischemic attack, unspecified: Secondary | ICD-10-CM | POA: Diagnosis not present

## 2014-04-18 DIAGNOSIS — I4891 Unspecified atrial fibrillation: Secondary | ICD-10-CM | POA: Diagnosis not present

## 2014-04-18 DIAGNOSIS — R002 Palpitations: Secondary | ICD-10-CM | POA: Diagnosis not present

## 2014-04-18 DIAGNOSIS — I639 Cerebral infarction, unspecified: Secondary | ICD-10-CM | POA: Diagnosis not present

## 2014-04-18 DIAGNOSIS — I771 Stricture of artery: Secondary | ICD-10-CM

## 2014-04-18 NOTE — Progress Notes (Signed)
[facility] HISTORY AND PHYSICAL   History of Present Illness:  Patient is a 81 y.o. year old female who presents for evaluation of left subclavian stenosis. This was found on a workup for TIA recently in the hospital. Her TIA episode was loss of speech for less than 10 minutes. CT scan showed no acute infarct. MRA showed left subclavian stenosis right vertebral stenosis and atherosclerotic sclerotic change of all of her great vessels. Carotid duplex exam showed less than 40% stenosis bilaterally. She did have reversal of flow in the left vertebral artery. She did have a right retinal embolic stroke several years ago. She denies any episodes of TIA amaurosis or stroke since discharge from the hospital last week. She occasionally gets dizzy when standing quickly. She is a long-term smoker but did quit after her recent hospitalization. Other medical problems include hypertension, history of abdominal aortic aneurysm (aortobifemoral bypass for small aneurysm and occlusive disease by my partner Dr. Donnetta Hutching in 2005), history of heart murmur (recent echo showed mild mitral regurgitation was )  these are all stable. She has follow-up scheduled with Dr. Percival Spanish in the near future for an event monitor.   Past Medical History  Diagnosis Date  . HTN (hypertension)   . Aortic aneurysm   . Transient ischemic attack   . Murmur, cardiac   . Cholelithiasis   . Internal hemorrhoids   . Irritable bowel syndrome   . Depression   . Eustachian tube dysfunction     pt denies  . Appendicitis   . CVA (cerebral infarction)     Right retinal branch occlusion  . Carotid artery plaque     hx of per left side   . Stroke   . Legally blind in right eye, as defined in [place]     related to past CVA  . Anxiety   . Chronic kidney disease     hx of kidney stones  . GERD (gastroesophageal reflux disease)     Past Surgical History  Procedure Laterality Date  . Abdominal aortic aneurysm repair   2005  . Kidney stones removed    . Appendectomy    . Abdominal hysterectomy    . Cholecystectomy    . Shoulder surgery      Left  . Wrist surgery      Right  . Tee without cardioversion  12/18/2011    Procedure: TRANSESOPHAGEAL ECHOCARDIOGRAM (TEE);  Surgeon: Larey Dresser, MD;  Location: [facility];  Service: Cardiovascular;  Laterality: N/A;  . Eye surgery      hx of lazy eye     Social History History  Substance Use Topics  . Smoking status: Former Smoker -- 0.50 packs/day for 20 years    Types: Cigarettes    Quit date: 04/14/2014  . Smokeless tobacco: Never Used  . Alcohol Use: No    Family History Family History  Problem Relation Age of Onset  . Asthma Brother     died age 41  . Diabetes Mother   . Coronary artery disease Mother     died age 62  . Coronary artery disease Father     died age 17  . Coronary artery disease Brother     died age 35  . Coronary artery disease Sister     died age 80    Allergies  Allergies  Allergen Reactions  . Shellfish Allergy Shortness Of Breath and Rash    All seafood  . Xylocaine [Lidocaine] Shortness  Of Breath  . Ampicillin     unknown  . Biaxin [Clarithromycin]     unknown  . Celebrex [Celecoxib]     Unknown   . Cozaar [Losartan]     High blood pressure  . Demerol [Meperidine]     unknown  . Elavil [Amitriptyline]     unknown  . Fosamax [Alendronate]     unknown  . Hydromorphone     Hives   . Inderal [Propranolol]     unknown  . Ivp Dye [Iodinated Diagnostic Agents]     rash  . Levaquin [Levofloxacin]     unknown  . Macrodantin [Nitrofurantoin]     unknown  . Mycinette [Phenol]     Unknown   . Neggram [Nalidixic Acid]     unknown  . Novocain [Procaine]     unknown  . Pamelor [Nortriptyline]     unknown  . Penicillins     rash  . Robaxin [Methocarbamol]     unknown  . Tamiflu [Oseltamivir]     Unknown   . Tenormin [Atenolol]     unknown  . Triavil [Perphenazine-Amitriptyline]      Unknown   . Paxil [Paroxetine] Rash  . Wellbutrin [Bupropion] Rash  . Zoloft [Sertraline] Rash     Current Outpatient Prescriptions  Medication Sig Dispense Refill  . acetaminophen (TYLENOL) 500 MG tablet Take 500 mg by mouth every 6 (six) hours as needed for moderate pain or headache.    Marland Kitchen amLODipine (NORVASC) 5 MG tablet Take 5 mg by mouth every morning.     Marland Kitchen aspirin EC 81 MG EC tablet Take 1 tablet (81 mg total) by mouth daily. 30 tablet 0  . Cholecalciferol (VITAMIN D PO) Take 1 tablet by mouth every morning.     . clopidogrel (PLAVIX) 75 MG tablet Take 1 tablet by mouth every morning.     . Coenzyme Q10 (CO Q-10) 50 MG CAPS Take 1 capsule by mouth every morning. Pt states takes 30 mgs    . cycloSPORINE (RESTASIS) 0.05 % ophthalmic emulsion Place 1 drop into both eyes 2 (two) times daily.    Marland Kitchen escitalopram (LEXAPRO) 10 MG tablet Take 5 mg by mouth every morning.     . ezetimibe (ZETIA) 10 MG tablet Take 5 mg by mouth at bedtime.     Marland Kitchen OLANZapine (ZYPREXA) 2.5 MG tablet Take 5 mg by mouth at bedtime.     . pantoprazole (PROTONIX) 40 MG tablet Take 40 mg by mouth every morning.     Vladimir Faster Glycol-Propyl Glycol (SYSTANE FREE OP) Place 1 drop into both eyes daily as needed (dry eyes).     . rosuvastatin (CRESTOR) 10 MG tablet Take 10 mg by mouth at bedtime.      No current facility-administered medications for this visit.    ROS:   General:  No weight loss, Fever, chills  HEENT: No recent headaches, no nasal bleeding, no visual changes, no sore throat  Neurologic: No dizziness, blackouts, seizures. + recent symptoms of stroke or mini- stroke. + recent episodes of slurred speech, or temporary blindness.  Cardiac: No recent episodes of chest pain/pressure, no shortness of breath at rest.  No shortness of breath with exertion.  Denies history of atrial fibrillation or irregular heartbeat  Vascular: No history of rest pain in feet.  No history of claudication.  No history of  non-healing ulcer, No history of DVT   Pulmonary: No home oxygen, no productive cough, no hemoptysis,  No asthma  or wheezing  Musculoskeletal:  [ ]  Arthritis, [ ]  Low back pain,  [ ]  Joint pain  Hematologic:No history of hypercoagulable state.  No history of easy bleeding.  No history of anemia  Gastrointestinal: No hematochezia or melena,  No gastroesophageal reflux, no trouble swallowing  Urinary: [ ]  chronic Kidney disease, [ ]  on HD - [ ]  MWF or [ ]  TTHS, [ ]  Burning with urination, [ ]  Frequent urination, [ ]  Difficulty urinating;   Skin: No rashes  Psychological: No history of anxiety,  No history of depression   Physical Examination  Filed Vitals:   04/18/14 1226  BP: 113/60  Pulse: 73  Resp: 18  Height: 5' 0.5" (1.537 m)  Weight: 136 lb 9.6 oz (61.961 kg)    Body mass index is 26.23 kg/(m^2).  General:  Alert and oriented, no acute distress HEENT: Normal Neck: Left-sided carotid and infraclavicular bruit Pulmonary: Clear to auscultation bilaterally Cardiac: Regular Rate and Rhythm with 3/6 systolic murmur heard best at the right second interspace Abdomen: Soft, non-tender, non-distended, no mass Skin: No rash Extremity Pulses:  2+ radial, brachial right side, absent left brachial radial pulse, 2+ femoral, dorsalis pedis, posterior tibial pulses bilaterally Musculoskeletal: No deformity or edema  Neurologic: Upper and lower extremity motor 5/5 and symmetric, mild droop right corner of mouth otherwise no facial asymmetry  DATA:  A duplex scan reviewed from recent Hospitalization Less Than 40% Stenosis Bilaterally Reversible Flow Left Vertebral Artery.  MRA reviewed which shows possible narrowing origin right vertebral artery high-grade left subclavian artery stenosis some atherosclerotic change of all great vessels including the proximal right subclavian artery   ASSESSMENT:  Recent left brain TIA. Evidence on MRI scan of posterior circulation event in the past.  Patient is currently on Plavix and aspirin. She does not really appear to be having symptoms from her left subclavian stenosis. She does have some dizziness but this seems more postural related. I discussed with the patient today the possibility of aortogram and possible left subclavian angioplasty and stenting to improve flow to her posterior circulation. I discussed with her the risk of recurrent stroke of about 5% per year. I also discussed with her the risk of stroke from the arteriogram and possible angioplasty or stenting which would be about 1%. She currently wishes to think about whether or not she wishes to have an intervention. She will call in the future if she wishes to have an intervention. Since she previously had her aortobifemoral bypass with Dr. Donnetta Hutching. We would schedule her for follow-up with him if she wishes further intervention to maintain continuity of care.   PLAN:  See above  Ruta Hinds, MD [facility] Office: [phone] Pager: [phone]

## 2014-04-18 NOTE — Progress Notes (Signed)
Patient ID: Allison [name], female   DOB: 12/15/1926, 81 y.o.   MRN: [id number] [employer] verite 30 day cardiac event monitor applied to patient.  Patient s/p TIA, rule out A-Fib.

## 2014-04-23 DIAGNOSIS — R350 Frequency of micturition: Secondary | ICD-10-CM | POA: Diagnosis not present

## 2014-04-23 DIAGNOSIS — Z8673 Personal history of transient ischemic attack (TIA), and cerebral infarction without residual deficits: Secondary | ICD-10-CM | POA: Diagnosis not present

## 2014-04-23 DIAGNOSIS — R5381 Other malaise: Secondary | ICD-10-CM | POA: Diagnosis not present

## 2014-05-04 ENCOUNTER — Ambulatory Visit (INDEPENDENT_AMBULATORY_CARE_PROVIDER_SITE_OTHER): Payer: Medicare Other | Admitting: Family Medicine

## 2014-05-04 VITALS — BP 128/68 | HR 70 | Temp 98.1°F | Resp 16 | Ht 60.5 in | Wt 137.0 lb

## 2014-05-04 DIAGNOSIS — R350 Frequency of micturition: Secondary | ICD-10-CM

## 2014-05-04 DIAGNOSIS — R5383 Other fatigue: Secondary | ICD-10-CM

## 2014-05-04 DIAGNOSIS — I639 Cerebral infarction, unspecified: Secondary | ICD-10-CM

## 2014-05-04 LAB — POCT URINALYSIS DIPSTICK
GLUCOSE UA: NEGATIVE
Nitrite, UA: NEGATIVE
Protein, UA: 30
Spec Grav, UA: 1.015
Urobilinogen, UA: 1
pH, UA: 5.5

## 2014-05-04 LAB — GLUCOSE, POCT (MANUAL RESULT ENTRY): POC Glucose: 99 mg/dl (ref 70–99)

## 2014-05-04 LAB — POCT CBC
Granulocyte percent: 67.3 %G (ref 37–80)
HCT, POC: 42.6 % (ref 37.7–47.9)
HEMOGLOBIN: 13.4 g/dL (ref 12.2–16.2)
LYMPH, POC: 2 (ref 0.6–3.4)
MCH, POC: 28.1 pg (ref 27–31.2)
MCHC: 31.4 g/dL — AB (ref 31.8–35.4)
MCV: 89.4 fL (ref 80–97)
MID (CBC): 0.7 (ref 0–0.9)
MPV: 8.9 fL (ref 0–99.8)
PLATELET COUNT, POC: 261 10*3/uL (ref 142–424)
POC GRANULOCYTE: 5.5 (ref 2–6.9)
POC LYMPH PERCENT: 24.3 %L (ref 10–50)
POC MID %: 8.4 % (ref 0–12)
RBC: 4.77 M/uL (ref 4.04–5.48)
RDW, POC: 15.1 %
WBC: 8.1 10*3/uL (ref 4.6–10.2)

## 2014-05-04 LAB — POCT UA - MICROSCOPIC ONLY
CASTS, UR, LPF, POC: NEGATIVE
CRYSTALS, UR, HPF, POC: NEGATIVE
Mucus, UA: NEGATIVE
Yeast, UA: NEGATIVE

## 2014-05-04 NOTE — Patient Instructions (Signed)
Because you have so many drug allergies and your urine is borderline, let's wait for your urine culture prior to treating you for a UTI. I will be in touch with your urine culture results asap, and we will figure out an antibiotic that you can use as needed In the meantime please drink plenty of water and let me know if you are getting worse!

## 2014-05-04 NOTE — Progress Notes (Addendum)
[facility] [address] [place] [address] [phone]  Date:  05/04/2014   Name:  Allison [name]   DOB:  11/21/1926   MRN:  [id number]  PCP:  Horatio Pel, MD    Chief Complaint: Fatigue and Medication Problem   History of Present Illness:  ISHI DANSER is a 81 y.o. very pleasant female patient who presents with the following:  She is here today as a new patient. Recently admitted with a TIA from 3/12 to 04/17/14.  Also noted to have subclavian steal, HTN.  She is not sure if she wants to do anything about the subclavian steal yet but has consulted with vascular surgery.   She is here today with left earache for one day, and fatigue for 2.5 weeks approx  She feels "like I just want to lay down and sleep."  She has not noted any slurred speech. No discrete weakness, no facial drooping.   She notes that the manufacturer of her zypreza changed; the drug store changed back to her old brand about one week ago.  However she wonders if perhaps her zyprexa is why she is feeling so tired.  Her PCP is Dr. Shelia Media; his office was not able to see her today so he recommended that she come here for eval.    She states that she has been eating ok.    She notes that "I don't have any get up and go."   She does not have any CP or SOB.    She is wearing a heart monitor right now- Dr. Percival Spanish.  She is blind in her right eye from a CVA in the 2013  She was started on zyprexa about 2 years ago- this was started for depression.  She has been on 5mg  for some time.    She also notes some urinary frequency and urgency over the last few days- wonders if she might have a UTI  Patient Active Problem List   Diagnosis Date Noted  . Palpitations   . Cerebral infarction due to unspecified mechanism   . TIA (transient ischemic attack) 04/15/2014  . Weakness 04/14/2014  . CVA (cerebral infarction) 04/14/2014  . Dizziness and giddiness   . Dysuria 10/08/2011  . HTN  (hypertension) 10/07/2011  . Embolic stroke 73/22/0254  . acute Blindness  right eye 10/07/2011  . Hyperlipemia 10/07/2011  . Depression 10/07/2011  . PUD (peptic ulcer disease) 10/07/2011  . Vasculopathy 10/07/2011  . Tobacco abuse 10/07/2011  . Retinal artery branch occlusion of right eye 10/07/2011  . Internal hemorrhoids   . Cholelithiasis     Past Medical History  Diagnosis Date  . HTN (hypertension)   . Aortic aneurysm   . Transient ischemic attack   . Murmur, cardiac   . Cholelithiasis   . Internal hemorrhoids   . Irritable bowel syndrome   . Depression   . Eustachian tube dysfunction     pt denies  . Appendicitis   . CVA (cerebral infarction)     Right retinal branch occlusion  . Carotid artery plaque     hx of per left side   . Stroke   . Legally blind in right eye, as defined in [place]     related to past CVA  . Anxiety   . Chronic kidney disease     hx of kidney stones  . GERD (gastroesophageal reflux disease)   . Allergy   . Arthritis   . Cataract   .  Clotting disorder   . Osteoporosis     Past Surgical History  Procedure Laterality Date  . Abdominal aortic aneurysm repair  2005  . Kidney stones removed    . Appendectomy    . Abdominal hysterectomy    . Cholecystectomy    . Shoulder surgery      Left  . Wrist surgery      Right  . Tee without cardioversion  12/18/2011    Procedure: TRANSESOPHAGEAL ECHOCARDIOGRAM (TEE);  Surgeon: Larey Dresser, MD;  Location: [facility];  Service: Cardiovascular;  Laterality: N/A;  . Eye surgery      hx of lazy eye     History  Substance Use Topics  . Smoking status: Former Smoker -- 0.50 packs/day for 20 years    Types: Cigarettes    Quit date: 04/14/2014  . Smokeless tobacco: Never Used  . Alcohol Use: No    Family History  Problem Relation Age of Onset  . Asthma Brother     died age 23  . Diabetes Mother   . Coronary artery disease Mother     died age 45  . Coronary artery disease Father      died age 35  . Coronary artery disease Brother     died age 84  . Coronary artery disease Sister     died age 45    Allergies  Allergen Reactions  . Shellfish Allergy Shortness Of Breath and Rash    All seafood  . Xylocaine [Lidocaine] Shortness Of Breath  . Ampicillin     unknown  . Biaxin [Clarithromycin]     unknown  . Celebrex [Celecoxib]     Unknown   . Cozaar [Losartan]     High blood pressure  . Demerol [Meperidine]     unknown  . Elavil [Amitriptyline]     unknown  . Fosamax [Alendronate]     unknown  . Hydromorphone     Hives   . Inderal [Propranolol]     unknown  . Ivp Dye [Iodinated Diagnostic Agents]     rash  . Levaquin [Levofloxacin]     unknown  . Macrodantin [Nitrofurantoin]     unknown  . Mycinette [Phenol]     Unknown   . Neggram [Nalidixic Acid]     unknown  . Novocain [Procaine]     unknown  . Pamelor [Nortriptyline]     unknown  . Penicillins     rash  . Robaxin [Methocarbamol]     unknown  . Tamiflu [Oseltamivir]     Unknown   . Tenormin [Atenolol]     unknown  . Triavil [Perphenazine-Amitriptyline]     Unknown   . Paxil [Paroxetine] Rash  . Wellbutrin [Bupropion] Rash  . Zoloft [Sertraline] Rash    Medication list has been reviewed and updated.  Current Outpatient Prescriptions on File Prior to Visit  Medication Sig Dispense Refill  . amLODipine (NORVASC) 5 MG tablet Take 5 mg by mouth every morning.     Marland Kitchen aspirin EC 81 MG EC tablet Take 1 tablet (81 mg total) by mouth daily. 30 tablet 0  . Cholecalciferol (VITAMIN D PO) Take 1 tablet by mouth every morning.     . clopidogrel (PLAVIX) 75 MG tablet Take 1 tablet by mouth every morning.     . Coenzyme Q10 (CO Q-10) 50 MG CAPS Take 1 capsule by mouth every morning. Pt states takes 30 mgs    . cycloSPORINE (RESTASIS) 0.05 % ophthalmic emulsion Place  1 drop into both eyes 2 (two) times daily.    Marland Kitchen escitalopram (LEXAPRO) 10 MG tablet Take 5 mg by mouth every morning.     .  ezetimibe (ZETIA) 10 MG tablet Take 5 mg by mouth at bedtime.     Marland Kitchen OLANZapine (ZYPREXA) 2.5 MG tablet Take 5 mg by mouth at bedtime.     . pantoprazole (PROTONIX) 40 MG tablet Take 40 mg by mouth every morning.     Vladimir Faster Glycol-Propyl Glycol (SYSTANE FREE OP) Place 1 drop into both eyes daily as needed (dry eyes).     . rosuvastatin (CRESTOR) 10 MG tablet Take 10 mg by mouth at bedtime.     Marland Kitchen acetaminophen (TYLENOL) 500 MG tablet Take 500 mg by mouth every 6 (six) hours as needed for moderate pain or headache.     No current facility-administered medications on file prior to visit.    Review of Systems:  As per HPI- otherwise negative.   Physical Examination: Filed Vitals:   05/04/14 1545  BP: 98/66  Pulse: 70  Temp: 98.1 F (36.7 C)  Resp: 16   Filed Vitals:   05/04/14 1545  Height: 5' 0.5" (1.537 m)  Weight: 137 lb (62.143 kg)   Body mass index is 26.31 kg/(m^2). Ideal Body Weight: Weight in (lb) to have BMI = 25: 129.9  GEN: WDWN, NAD, Non-toxic, A & O x 3, elderly lady who is not in distress.  Blind in right eye HEENT: Atraumatic, Normocephalic. Neck supple. No masses, No LAD.  Bilateral TM wnl, oropharynx normal.  PEERL,EOMI.   No evidence of otits Ears and Nose: No external deformity. CV: RRR, No M/G/R. No JVD. No thrill. No extra heart sounds. PULM: CTA B, no wheezes, crackles, rhonchi. No retractions. No resp. distress. No accessory muscle use. ABD: S, NT, ND EXTR: No c/c/e NEURO Normal gait.  PSYCH: Normally interactive. Conversant. Not depressed or anxious appearing.  Calm demeanor.   Results for orders placed or performed in visit on 05/04/14  POCT UA - Microscopic Only  Result Value Ref Range   WBC, Ur, HPF, POC 3-5    RBC, urine, microscopic 0-1    Bacteria, U Microscopic trace    Mucus, UA neg    Epithelial cells, urine per micros 0-1    Crystals, Ur, HPF, POC neg    Casts, Ur, LPF, POC neg    Yeast, UA neg   POCT urinalysis dipstick  Result  Value Ref Range   Color, UA yellow    Clarity, UA cloudy    Glucose, UA neg    Bilirubin, UA small    Ketones, UA trace    Spec Grav, UA 1.015    Blood, UA trace-lysed    pH, UA 5.5    Protein, UA 30    Urobilinogen, UA 1.0    Nitrite, UA negative    Leukocytes, UA small (1+)   POCT CBC  Result Value Ref Range   WBC 8.1 4.6 - 10.2 K/uL   Lymph, poc 2.0 0.6 - 3.4   POC LYMPH PERCENT 24.3 10 - 50 %L   MID (cbc) 0.7 0 - 0.9   POC MID % 8.4 0 - 12 %M   POC Granulocyte 5.5 2 - 6.9   Granulocyte percent 67.3 37 - 80 %G   RBC 4.77 4.04 - 5.48 M/uL   Hemoglobin 13.4 12.2 - 16.2 g/dL   HCT, POC 42.6 37.7 - 47.9 %   MCV 89.4 80 -  97 fL   MCH, POC 28.1 27 - 31.2 pg   MCHC 31.4 (A) 31.8 - 35.4 g/dL   RDW, POC 15.1 %   Platelet Count, POC 261 142 - 424 K/uL   MPV 8.9 0 - 99.8 fL  POCT glucose (manual entry)  Result Value Ref Range   POC Glucose 99 70 - 99 mg/dl    Assessment and Plan: Urinary frequency - Plan: POCT UA - Microscopic Only, POCT urinalysis dipstick, Urine culture, POCT glucose (manual entry)  Other fatigue - Plan: Comprehensive metabolic panel, POCT CBC  Here today with non- specific fatigue of more than 2 weeks.  Certainly this could be due to zyprexa, but she also is just getting over a TIA.  UTI is also a consideration- await urine culture as she has so many drug allergies.  I will follow-up with her pending her other labs and she will let me know if any worsening  Signed Lamar Blinks, MD  Received her urine culture on 4/3:  85K mixed colonies.  Called the listed number; her husband answered but Ilee is in [place], he does not know a number where she can be reached and does not know what day she will be home.  I will try back later  4/5- called back and was able to reach her.  Explained her unclear urine results.  We can treat her with fosfomycin as she is not allergic to this.  Will send to her drug store and she will let me know if not feeling better soon

## 2014-05-05 LAB — COMPREHENSIVE METABOLIC PANEL
ALT: 9 U/L (ref 0–35)
AST: 15 U/L (ref 0–37)
Albumin: 4.3 g/dL (ref 3.5–5.2)
Alkaline Phosphatase: 65 U/L (ref 39–117)
BUN: 11 mg/dL (ref 6–23)
CO2: 26 mEq/L (ref 19–32)
Calcium: 9.6 mg/dL (ref 8.4–10.5)
Chloride: 106 mEq/L (ref 96–112)
Creat: 0.91 mg/dL (ref 0.50–1.10)
Glucose, Bld: 77 mg/dL (ref 70–99)
POTASSIUM: 3.6 meq/L (ref 3.5–5.3)
SODIUM: 144 meq/L (ref 135–145)
Total Bilirubin: 0.5 mg/dL (ref 0.2–1.2)
Total Protein: 7 g/dL (ref 6.0–8.3)

## 2014-05-06 LAB — URINE CULTURE: Colony Count: 80000

## 2014-05-08 ENCOUNTER — Encounter: Payer: Self-pay | Admitting: Family Medicine

## 2014-05-08 MED ORDER — FOSFOMYCIN TROMETHAMINE 3 G PO PACK: 3.0000 g | PACK | Freq: Once | ORAL | Status: DC

## 2014-05-08 NOTE — Addendum Note (Signed)
Addended by: Lamar Blinks C on: 05/08/2014 05:43 PM   Modules accepted: Orders

## 2014-05-15 DIAGNOSIS — F328 Other depressive episodes: Secondary | ICD-10-CM | POA: Diagnosis not present

## 2014-05-15 DIAGNOSIS — N3010 Interstitial cystitis (chronic) without hematuria: Secondary | ICD-10-CM | POA: Diagnosis not present

## 2014-05-15 DIAGNOSIS — N390 Urinary tract infection, site not specified: Secondary | ICD-10-CM | POA: Diagnosis not present

## 2014-05-23 DIAGNOSIS — R300 Dysuria: Secondary | ICD-10-CM | POA: Diagnosis not present

## 2014-05-23 DIAGNOSIS — F328 Other depressive episodes: Secondary | ICD-10-CM | POA: Diagnosis not present

## 2014-05-31 DIAGNOSIS — F329 Major depressive disorder, single episode, unspecified: Secondary | ICD-10-CM | POA: Diagnosis not present

## 2014-05-31 DIAGNOSIS — N390 Urinary tract infection, site not specified: Secondary | ICD-10-CM | POA: Diagnosis not present

## 2014-06-07 ENCOUNTER — Ambulatory Visit: Payer: BLUE CROSS/BLUE SHIELD | Admitting: Neurology

## 2014-06-18 ENCOUNTER — Telehealth: Payer: Self-pay | Admitting: Neurology

## 2014-06-18 NOTE — Telephone Encounter (Signed)
Got the fax result of cardiac event monitoring which is negative for Afib.   Rosalin Hawking, MD PhD Stroke Neurology 06/18/2014 8:25 AM

## 2014-07-04 DIAGNOSIS — E780 Pure hypercholesterolemia: Secondary | ICD-10-CM | POA: Diagnosis not present

## 2014-07-04 DIAGNOSIS — I10 Essential (primary) hypertension: Secondary | ICD-10-CM | POA: Diagnosis not present

## 2014-07-09 DIAGNOSIS — Z0000 Encounter for general adult medical examination without abnormal findings: Secondary | ICD-10-CM | POA: Diagnosis not present

## 2014-07-10 DIAGNOSIS — I771 Stricture of artery: Secondary | ICD-10-CM | POA: Diagnosis not present

## 2014-07-10 DIAGNOSIS — R0989 Other specified symptoms and signs involving the circulatory and respiratory systems: Secondary | ICD-10-CM | POA: Diagnosis not present

## 2014-07-10 DIAGNOSIS — F339 Major depressive disorder, recurrent, unspecified: Secondary | ICD-10-CM | POA: Diagnosis not present

## 2014-07-10 DIAGNOSIS — H5441 Blindness, right eye, normal vision left eye: Secondary | ICD-10-CM | POA: Diagnosis not present

## 2014-07-11 ENCOUNTER — Ambulatory Visit (INDEPENDENT_AMBULATORY_CARE_PROVIDER_SITE_OTHER): Payer: Medicare Other | Admitting: Cardiology

## 2014-07-11 ENCOUNTER — Encounter: Payer: Self-pay | Admitting: Cardiology

## 2014-07-11 VITALS — BP 130/76 | HR 64 | Ht 61.0 in | Wt 140.0 lb

## 2014-07-11 DIAGNOSIS — H34231 Retinal artery branch occlusion, right eye: Secondary | ICD-10-CM | POA: Diagnosis not present

## 2014-07-11 DIAGNOSIS — I639 Cerebral infarction, unspecified: Secondary | ICD-10-CM

## 2014-07-11 DIAGNOSIS — I10 Essential (primary) hypertension: Secondary | ICD-10-CM

## 2014-07-11 NOTE — Progress Notes (Signed)
HPI The patient presents for evaluation after a recent hospitalization for TIA. She has had a previous stroke in 2014 secondary to emboli to the right retinal artery. Her work up in the past included carotid Doppler which demonstrated nonobstructive disease.  She has had an echocardiogram which demonstrated LVH with an EF of 60% and moderate tricuspid regurgitation. She did have a CT angiogram which demonstrated moderate distal vertebral artery stenosis. TEE scheduled after the last appointment demonstrated normal LV size with moderate LV hypertrophy. EF 55%.  She had no LAA thrombus. There was a small PFO with bubbles crossing at rest. There was extensive aortic atherosclerosis noted from the arch into the descending thoracic aorta.   With the last hospitalization she had an event monitor which demonstrated no atrial fibrillation. I did review these studies and records. She also had follow-up with the vascular surgeons because of subclavian stenosis. However, the patient did not want further angiography and so no further imaging is planned.  The patient reports that since her last hospitalization she's had no acute problems. She denies any chest pressure, neck or arm discomfort. She has no palpitations, presyncope or syncope. She has no new shortness of breath, PND or orthopnea. She's had no weight gain or edema. Unfortunately she is unable to quit smoking although she did for a very short period.   Since I last saw her she has done well.  The patient denies any new symptoms such as chest discomfort, neck or arm discomfort. There has been no new shortness of breath, PND or orthopnea. There have been no reported palpitations, presyncope or syncope.  She is slightly limited by back pain.    Allergies  Allergen Reactions  . Shellfish Allergy Shortness Of Breath and Rash    All seafood  . Xylocaine [Lidocaine] Shortness Of Breath  . Ampicillin     unknown  . Biaxin [Clarithromycin]     unknown  .  Celebrex [Celecoxib]     Unknown   . Cozaar [Losartan]     High blood pressure  . Demerol [Meperidine]     unknown  . Elavil [Amitriptyline]     unknown  . Fosamax [Alendronate]     unknown  . Hydromorphone     Hives   . Inderal [Propranolol]     unknown  . Ivp Dye [Iodinated Diagnostic Agents]     rash  . Levaquin [Levofloxacin]     unknown  . Macrodantin [Nitrofurantoin]     unknown  . Mycinette [Phenol]     Unknown   . Neggram [Nalidixic Acid]     unknown  . Novocain [Procaine]     unknown  . Pamelor [Nortriptyline]     unknown  . Penicillins     rash  . Robaxin [Methocarbamol]     unknown  . Tamiflu [Oseltamivir]     Unknown   . Tenormin [Atenolol]     unknown  . Triavil [Perphenazine-Amitriptyline]     Unknown   . Paxil [Paroxetine] Rash  . Wellbutrin [Bupropion] Rash  . Zoloft [Sertraline] Rash    Current Outpatient Prescriptions  Medication Sig Dispense Refill  . acetaminophen (TYLENOL) 500 MG tablet Take 500 mg by mouth every 6 (six) hours as needed for moderate pain or headache.    Marland Kitchen amLODipine (NORVASC) 5 MG tablet Take 5 mg by mouth every morning.     . Cholecalciferol (VITAMIN D PO) Take 1 tablet by mouth every morning.     . clopidogrel (PLAVIX) 75  MG tablet Take 1 tablet by mouth every morning.     . Coenzyme Q10 (CO Q-10) 50 MG CAPS Take 1 capsule by mouth every morning. Pt states takes 30 mgs    . cycloSPORINE (RESTASIS) 0.05 % ophthalmic emulsion Place 1 drop into both eyes 2 (two) times daily.    . diazepam (VALIUM) 2 MG tablet Take 2 mg by mouth every 12 (twelve) hours as needed for anxiety (1/2 to 1 tablet twice daily).    Marland Kitchen escitalopram (LEXAPRO) 20 MG tablet Take 20 mg by mouth daily.   1  . ezetimibe (ZETIA) 10 MG tablet Take 5 mg by mouth at bedtime.     Marland Kitchen OLANZapine (ZYPREXA) 10 MG tablet Take 10 mg by mouth daily.  2  . pantoprazole (PROTONIX) 40 MG tablet Take 40 mg by mouth every morning.     Vladimir Faster Glycol-Propyl Glycol  (SYSTANE FREE OP) Place 1 drop into both eyes daily as needed (dry eyes).     . rosuvastatin (CRESTOR) 10 MG tablet Take 10 mg by mouth at bedtime.      No current facility-administered medications for this visit.    Past Medical History  Diagnosis Date  . HTN (hypertension)   . Aortic aneurysm   . Transient ischemic attack   . Murmur, cardiac   . Cholelithiasis   . Internal hemorrhoids   . Irritable bowel syndrome   . Depression   . Eustachian tube dysfunction     pt denies  . Appendicitis   . CVA (cerebral infarction)     Right retinal branch occlusion  . Carotid artery plaque     hx of per left side   . Stroke   . Legally blind in right eye, as defined in [place]     related to past CVA  . Anxiety   . Chronic kidney disease     hx of kidney stones  . GERD (gastroesophageal reflux disease)   . Allergy   . Arthritis   . Cataract   . Clotting disorder   . Osteoporosis     Past Surgical History  Procedure Laterality Date  . Abdominal aortic aneurysm repair  2005  . Kidney stones removed    . Appendectomy    . Abdominal hysterectomy    . Cholecystectomy    . Shoulder surgery      Left  . Wrist surgery      Right  . Tee without cardioversion  12/18/2011    Procedure: TRANSESOPHAGEAL ECHOCARDIOGRAM (TEE);  Surgeon: Larey Dresser, MD;  Location: [facility];  Service: Cardiovascular;  Laterality: N/A;  . Eye surgery      hx of lazy eye     ROS:  As stated in the history of present illness and negative for all other systems.  PHYSICAL EXAM BP 130/76 mmHg  Pulse 64  Ht 5\' 1"  (1.549 m)  Wt 140 lb (63.504 kg)  BMI 26.47 kg/m2 GENERAL:  Well appearing HEENT:  Pupils equal round and reactive, fundi not visualized, oral mucosa unremarkable, dentures NECK:  No jugular venous distention, waveform within normal limits, carotid upstroke brisk and symmetric, positive bilateral bruits, no thyromegaly LYMPHATICS:  No cervical, inguinal adenopathy LUNGS:  Clear to  auscultation bilaterally CHEST:  Unremarkable HEART:  PMI not displaced or sustained,S1 and S2 within normal limits, no S3, no S4, no clicks, no rubs, 2/6 holosystolic apical and left sternal border murmur ABD:  Flat, positive bowel sounds normal in frequency in pitch, positive  bruits, no rebound, no guarding, no midline pulsatile mass, no hepatomegaly, no splenomegaly EXT:  2 plus pulses throughout, no edema, no cyanosis no clubbing SKIN:  No rashes no nodules    ASSESSMENT AND PLAN  Embolic CVA - It is likely that her embolic event was secondary to atheroemboli.  I reviewed the records from her hospitalization in March. She has treatment of her risk factors by her primary providers in follow-up of her peripheral vascular disease by [facility].  There is no evidence of dysrhythmia. I don't think that further cardiac workup is indicated.  Tobacco abuse - We discussed this. She doesn't think she would at this time.    Dyslipidemia - This is followed by Dr. Shelia Media.  She will remain on Crestor.    Extensive records reviewed.

## 2014-07-11 NOTE — Patient Instructions (Signed)
Medication Instructions:  Your physician recommends that you continue on your current medications as directed. Please refer to the Current Medication list given to you today.  Follow-Up: Follow up in 1 year with Dr. Percival Spanish in [place].  You will receive a letter in the mail 2 months before you are due.  Please call us when you receive this letter to schedule your follow up appointment.  Thank you for choosing [facility]!!

## 2014-07-30 ENCOUNTER — Other Ambulatory Visit: Payer: Self-pay

## 2014-07-30 DIAGNOSIS — R7309 Other abnormal glucose: Secondary | ICD-10-CM | POA: Diagnosis not present

## 2014-07-30 DIAGNOSIS — E559 Vitamin D deficiency, unspecified: Secondary | ICD-10-CM | POA: Diagnosis not present

## 2014-08-04 ENCOUNTER — Emergency Department (HOSPITAL_BASED_OUTPATIENT_CLINIC_OR_DEPARTMENT_OTHER)
Admit: 2014-08-04 | Discharge: 2014-08-04 | Disposition: A | Discharge status: Home / Self Care | Payer: Medicare Other | Attending: Emergency Medicine | Admitting: Emergency Medicine

## 2014-08-04 ENCOUNTER — Emergency Department (HOSPITAL_COMMUNITY)
Admission: EM | Admit: 2014-08-04 | Discharge: 2014-08-04 | Disposition: A | Discharge status: Home / Self Care | Payer: Medicare Other | Attending: Emergency Medicine | Admitting: Emergency Medicine

## 2014-08-04 ENCOUNTER — Emergency Department (HOSPITAL_COMMUNITY): Payer: Medicare Other

## 2014-08-04 ENCOUNTER — Encounter (HOSPITAL_COMMUNITY): Payer: Self-pay | Admitting: Nurse Practitioner

## 2014-08-04 DIAGNOSIS — I129 Hypertensive chronic kidney disease with stage 1 through stage 4 chronic kidney disease, or unspecified chronic kidney disease: Secondary | ICD-10-CM | POA: Diagnosis not present

## 2014-08-04 DIAGNOSIS — R011 Cardiac murmur, unspecified: Secondary | ICD-10-CM | POA: Diagnosis not present

## 2014-08-04 DIAGNOSIS — R2240 Localized swelling, mass and lump, unspecified lower limb: Secondary | ICD-10-CM | POA: Diagnosis not present

## 2014-08-04 DIAGNOSIS — Z7902 Long term (current) use of antithrombotics/antiplatelets: Secondary | ICD-10-CM | POA: Diagnosis not present

## 2014-08-04 DIAGNOSIS — M545 Low back pain: Secondary | ICD-10-CM | POA: Diagnosis not present

## 2014-08-04 DIAGNOSIS — M1990 Unspecified osteoarthritis, unspecified site: Secondary | ICD-10-CM | POA: Diagnosis not present

## 2014-08-04 DIAGNOSIS — H5441 Blindness, right eye, normal vision left eye: Secondary | ICD-10-CM | POA: Insufficient documentation

## 2014-08-04 DIAGNOSIS — R109 Unspecified abdominal pain: Secondary | ICD-10-CM | POA: Insufficient documentation

## 2014-08-04 DIAGNOSIS — M47816 Spondylosis without myelopathy or radiculopathy, lumbar region: Secondary | ICD-10-CM | POA: Diagnosis not present

## 2014-08-04 DIAGNOSIS — N189 Chronic kidney disease, unspecified: Secondary | ICD-10-CM | POA: Diagnosis not present

## 2014-08-04 DIAGNOSIS — M79609 Pain in unspecified limb: Secondary | ICD-10-CM

## 2014-08-04 DIAGNOSIS — Z79899 Other long term (current) drug therapy: Secondary | ICD-10-CM | POA: Insufficient documentation

## 2014-08-04 DIAGNOSIS — F419 Anxiety disorder, unspecified: Secondary | ICD-10-CM | POA: Diagnosis not present

## 2014-08-04 DIAGNOSIS — Z880 Allergy status to penicillin: Secondary | ICD-10-CM | POA: Insufficient documentation

## 2014-08-04 DIAGNOSIS — M4186 Other forms of scoliosis, lumbar region: Secondary | ICD-10-CM | POA: Diagnosis not present

## 2014-08-04 DIAGNOSIS — K219 Gastro-esophageal reflux disease without esophagitis: Secondary | ICD-10-CM | POA: Insufficient documentation

## 2014-08-04 DIAGNOSIS — I700 Atherosclerosis of aorta: Secondary | ICD-10-CM | POA: Diagnosis not present

## 2014-08-04 DIAGNOSIS — M7989 Other specified soft tissue disorders: Secondary | ICD-10-CM | POA: Diagnosis not present

## 2014-08-04 DIAGNOSIS — M549 Dorsalgia, unspecified: Secondary | ICD-10-CM | POA: Diagnosis not present

## 2014-08-04 DIAGNOSIS — Z87442 Personal history of urinary calculi: Secondary | ICD-10-CM | POA: Insufficient documentation

## 2014-08-04 DIAGNOSIS — I10 Essential (primary) hypertension: Secondary | ICD-10-CM | POA: Diagnosis not present

## 2014-08-04 DIAGNOSIS — Z87891 Personal history of nicotine dependence: Secondary | ICD-10-CM | POA: Insufficient documentation

## 2014-08-04 DIAGNOSIS — Z8673 Personal history of transient ischemic attack (TIA), and cerebral infarction without residual deficits: Secondary | ICD-10-CM | POA: Insufficient documentation

## 2014-08-04 DIAGNOSIS — R1031 Right lower quadrant pain: Secondary | ICD-10-CM

## 2014-08-04 DIAGNOSIS — M79604 Pain in right leg: Secondary | ICD-10-CM | POA: Insufficient documentation

## 2014-08-04 DIAGNOSIS — F329 Major depressive disorder, single episode, unspecified: Secondary | ICD-10-CM | POA: Diagnosis not present

## 2014-08-04 DIAGNOSIS — Z90710 Acquired absence of both cervix and uterus: Secondary | ICD-10-CM | POA: Insufficient documentation

## 2014-08-04 DIAGNOSIS — R1030 Lower abdominal pain, unspecified: Secondary | ICD-10-CM | POA: Diagnosis not present

## 2014-08-04 DIAGNOSIS — Z9049 Acquired absence of other specified parts of digestive tract: Secondary | ICD-10-CM | POA: Insufficient documentation

## 2014-08-04 NOTE — ED Notes (Signed)
Pt hasn't returned from vascular at this time.

## 2014-08-04 NOTE — ED Provider Notes (Signed)
CSN: [id number]     Arrival date & time 08/04/14  1118 History   First MD Initiated Contact with Patient 08/04/14 1148     Chief Complaint  Patient presents with  . Leg Pain     (Consider location/radiation/quality/duration/timing/severity/associated sxs/prior Treatment) Patient is a 81 y.o. female presenting with leg pain.  Leg Pain Associated symptoms: back pain   patient has had pain in her right lower extremity for the last few days. Worse with walking. Started in her calf and now is more in her groin. Now more severe. No trauma.no fevers. No chest pain. Some swelling in her thigh. Previous stroke. No rash. Also some lower back. Past Medical History  Diagnosis Date  . HTN (hypertension)   . Aortic aneurysm   . Transient ischemic attack   . Murmur, cardiac   . Cholelithiasis   . Internal hemorrhoids   . Irritable bowel syndrome   . Depression   . Eustachian tube dysfunction     pt denies  . Appendicitis   . CVA (cerebral infarction)     Right retinal branch occlusion  . Carotid artery plaque     hx of per left side   . Stroke   . Legally blind in right eye, as defined in [place]     related to past CVA  . Anxiety   . Chronic kidney disease     hx of kidney stones  . GERD (gastroesophageal reflux disease)   . Allergy   . Arthritis   . Cataract   . Clotting disorder   . Osteoporosis    Past Surgical History  Procedure Laterality Date  . Abdominal aortic aneurysm repair  2005  . Kidney stones removed    . Appendectomy    . Abdominal hysterectomy    . Cholecystectomy    . Shoulder surgery      Left  . Wrist surgery      Right  . Tee without cardioversion  12/18/2011    Procedure: TRANSESOPHAGEAL ECHOCARDIOGRAM (TEE);  Surgeon: Larey Dresser, MD;  Location: [facility];  Service: Cardiovascular;  Laterality: N/A;  . Eye surgery      hx of lazy eye    Family History  Problem Relation Age of Onset  . Asthma Brother     died age 24  . Diabetes Mother   .  Coronary artery disease Mother     died age 54  . Coronary artery disease Father     died age 71  . Coronary artery disease Brother     died age 78  . Coronary artery disease Sister     died age 8   History  Substance Use Topics  . Smoking status: Former Smoker -- 0.50 packs/day for 20 years    Types: Cigarettes    Quit date: 04/14/2014  . Smokeless tobacco: Never Used  . Alcohol Use: No   OB History    No data available     Review of Systems  Constitutional: Negative for activity change and appetite change.  Eyes: Negative for pain.  Respiratory: Negative for chest tightness and shortness of breath.   Cardiovascular: Positive for leg swelling. Negative for chest pain.  Gastrointestinal: Negative for nausea, vomiting, abdominal pain and diarrhea.  Genitourinary: Negative for flank pain.  Musculoskeletal: Positive for back pain. Negative for neck stiffness.  Skin: Negative for rash and wound.  Neurological: Negative for weakness, numbness and headaches.  Psychiatric/Behavioral: Negative for behavioral problems.  Allergies  Shellfish allergy; Xylocaine; Biaxin; Celebrex; Cozaar; Demerol; Elavil; Fosamax; Hydromorphone; Inderal; Ivp dye; Levaquin; Macrodantin; Mycinette; Neggram; Novocain; Pamelor; Penicillins; Robaxin; Tamiflu; Tenormin; Triavil; Ampicillin; Paxil; Wellbutrin; and Zoloft  Home Medications   Prior to Admission medications   Medication Sig Start Date End Date Taking? Authorizing Provider  amLODipine (NORVASC) 5 MG tablet Take 5 mg by mouth every morning.    Yes Historical Provider, MD  Cholecalciferol (VITAMIN D PO) Take 1 tablet by mouth every morning.    Yes Historical Provider, MD  clopidogrel (PLAVIX) 75 MG tablet Take 1 tablet by mouth every morning.  10/28/12  Yes Historical Provider, MD  Coenzyme Q10 (CO Q-10) 50 MG CAPS Take 1 capsule by mouth every morning. Pt states takes 30 mgs   Yes Historical Provider, MD  cycloSPORINE (RESTASIS) 0.05 %  ophthalmic emulsion Place 1 drop into both eyes 2 (two) times daily.   Yes Historical Provider, MD  diazepam (VALIUM) 2 MG tablet Take 1-2 mg by mouth every 12 (twelve) hours as needed for anxiety.    Yes Historical Provider, MD  escitalopram (LEXAPRO) 20 MG tablet Take 20 mg by mouth daily.  07/06/14  Yes Historical Provider, MD  ezetimibe (ZETIA) 10 MG tablet Take 5 mg by mouth at bedtime.    Yes Historical Provider, MD  OLANZapine (ZYPREXA) 10 MG tablet Take 10 mg by mouth daily. 07/07/14  Yes Historical Provider, MD  pantoprazole (PROTONIX) 40 MG tablet Take 40 mg by mouth every morning.    Yes Historical Provider, MD  rosuvastatin (CRESTOR) 10 MG tablet Take 10 mg by mouth at bedtime.    Yes Historical Provider, MD  Polyethyl Glycol-Propyl Glycol (SYSTANE FREE OP) Place 1 drop into both eyes daily as needed (dry eyes).     Historical Provider, MD   BP 123/41 mmHg  Pulse 66  Temp(Src) 98.3 F (36.8 C) (Oral)  Resp 22  SpO2 94% Physical Exam  Constitutional: She appears well-developed and well-nourished.  Cardiovascular: Normal rate and regular rhythm.   Pulmonary/Chest: Effort normal.  Abdominal: Soft. There is no tenderness.  Musculoskeletal:  Lower lumbar tenderness without deformity. Pain with straight leg raise on right. No right groin mass. Femoral pulse intact. DP pulse intact on right. Right thigh seems somewhat larger than left.   Skin: Skin is warm.    ED Course  Procedures (including critical care time) Labs Review Labs Reviewed - No data to display  Imaging Review Dg Lumbar Spine Complete  08/04/2014   CLINICAL DATA:  Anterior RIGHT groin pain, pain radiating down to foot, back pain after walking  EXAM: LUMBAR SPINE - COMPLETE 4+ VIEW  COMPARISON:  None.  FINDINGS: Osseous demineralization.  Five non-rib-bearing lumbar type segments.  Dextro convex thoracolumbar scoliosis apex L1.  Multilevel disc space narrowing.  Minimal scattered endplate spur formation.  Facet  degenerative changes lower lumbar spine.  Vertebral body heights maintained without fracture or subluxation.  Extensive atherosclerotic calcifications with question mild dilatation of the mid to distal abdominal aorta, 3.2 cm AP on lateral view though this contains inherent radiographic magnification.  IMPRESSION: Osseous demineralization with dextro convex scoliosis and mild scattered degenerative changes of the lumbar spine.  No acute osseous findings.  Significant atherosclerotic calcification with question aneurysmal dilatation of abdominal aorta; recommend followup aortic ultrasound assessment.   Electronically Signed   By: Lavonia Dana M.D.   On: 08/04/2014 13:18   Dg Hip Unilat With Pelvis 2-3 Views Right  08/04/2014   CLINICAL DATA:  RIGHT  anterior groin pain radiating to foot for 2 days, back pain after walking  EXAM: RIGHT HIP (WITH PELVIS) 2-3 VIEWS  COMPARISON:  None  FINDINGS: Diffuse osseous demineralization.  Hip and SI joints symmetric and preserved.  No acute fracture, dislocation or bone destruction.  Degenerative disc and facet disease changes at visualized lower lumbar spine.  Scattered pelvic phleboliths and a few atherosclerotic calcifications.  IMPRESSION: No acute osseous findings.  Osseous demineralization with degenerative changes at visualized lower lumbar spine.   Electronically Signed   By: Lavonia Dana M.D.   On: 08/04/2014 13:16     EKG Interpretation None      MDM   Final diagnoses:  Groin pain, right  Pain of right lower extremity    Patient with leg pain and pain to the back. X-rays reassuring for bony injury and no DVT, however there was some question about her aortic calcifications. Has had previous aortic surgery. Ultrasound to be done.    Davonna Belling, MD 08/04/14 [phone]

## 2014-08-04 NOTE — ED Notes (Signed)
Patient transported to Ultrasound 

## 2014-08-04 NOTE — Progress Notes (Addendum)
VASCULAR LAB PRELIMINARY  PRELIMINARY  PRELIMINARY  PRELIMINARY  Right lower extremity venous Doppler completed.    Preliminary report:  There is no DVT or SVT noted in the right lower extremity.  Incidentally, there is significant calcific plaque noted in the bilateral common femoral arteries with waveforms suggestive of possible iliac disease.   Jameca Chumley, RVT 08/04/2014, 12:41 PM

## 2014-08-04 NOTE — ED Notes (Signed)
Pt is in stable condition upon d/c and is escorted from ED via wheelchair. 

## 2014-08-04 NOTE — Discharge Instructions (Signed)

## 2014-08-04 NOTE — ED Notes (Signed)
She had onset R knee and R lower leg pain on Monday. The pain has worsened over the week and is now in her R upper leg into her groin. She denies any injury. She does have history of stroke and family is concerned for DVT. Skin warm/dry, pulses intact, skin color WNL

## 2014-08-09 DIAGNOSIS — M25561 Pain in right knee: Secondary | ICD-10-CM | POA: Diagnosis not present

## 2014-08-09 DIAGNOSIS — M25461 Effusion, right knee: Secondary | ICD-10-CM | POA: Diagnosis not present

## 2014-08-15 DIAGNOSIS — M25461 Effusion, right knee: Secondary | ICD-10-CM | POA: Diagnosis not present

## 2014-08-22 DIAGNOSIS — J439 Emphysema, unspecified: Secondary | ICD-10-CM | POA: Diagnosis not present

## 2014-08-22 DIAGNOSIS — I69398 Other sequelae of cerebral infarction: Secondary | ICD-10-CM | POA: Diagnosis not present

## 2014-08-22 DIAGNOSIS — M1990 Unspecified osteoarthritis, unspecified site: Secondary | ICD-10-CM | POA: Diagnosis not present

## 2014-08-22 DIAGNOSIS — H5441 Blindness, right eye, normal vision left eye: Secondary | ICD-10-CM | POA: Diagnosis not present

## 2014-08-22 DIAGNOSIS — G629 Polyneuropathy, unspecified: Secondary | ICD-10-CM | POA: Diagnosis not present

## 2014-08-22 DIAGNOSIS — I10 Essential (primary) hypertension: Secondary | ICD-10-CM | POA: Diagnosis not present

## 2014-08-27 DIAGNOSIS — M1990 Unspecified osteoarthritis, unspecified site: Secondary | ICD-10-CM | POA: Diagnosis not present

## 2014-08-27 DIAGNOSIS — G629 Polyneuropathy, unspecified: Secondary | ICD-10-CM | POA: Diagnosis not present

## 2014-08-27 DIAGNOSIS — I69398 Other sequelae of cerebral infarction: Secondary | ICD-10-CM | POA: Diagnosis not present

## 2014-08-27 DIAGNOSIS — H5441 Blindness, right eye, normal vision left eye: Secondary | ICD-10-CM | POA: Diagnosis not present

## 2014-08-27 DIAGNOSIS — J439 Emphysema, unspecified: Secondary | ICD-10-CM | POA: Diagnosis not present

## 2014-08-27 DIAGNOSIS — I10 Essential (primary) hypertension: Secondary | ICD-10-CM | POA: Diagnosis not present

## 2014-08-29 DIAGNOSIS — M810 Age-related osteoporosis without current pathological fracture: Secondary | ICD-10-CM | POA: Diagnosis not present

## 2014-08-30 DIAGNOSIS — J439 Emphysema, unspecified: Secondary | ICD-10-CM | POA: Diagnosis not present

## 2014-08-30 DIAGNOSIS — H5441 Blindness, right eye, normal vision left eye: Secondary | ICD-10-CM | POA: Diagnosis not present

## 2014-08-30 DIAGNOSIS — G629 Polyneuropathy, unspecified: Secondary | ICD-10-CM | POA: Diagnosis not present

## 2014-08-30 DIAGNOSIS — I10 Essential (primary) hypertension: Secondary | ICD-10-CM | POA: Diagnosis not present

## 2014-08-30 DIAGNOSIS — I69398 Other sequelae of cerebral infarction: Secondary | ICD-10-CM | POA: Diagnosis not present

## 2014-08-30 DIAGNOSIS — M1990 Unspecified osteoarthritis, unspecified site: Secondary | ICD-10-CM | POA: Diagnosis not present

## 2014-09-06 DIAGNOSIS — I10 Essential (primary) hypertension: Secondary | ICD-10-CM | POA: Diagnosis not present

## 2014-09-06 DIAGNOSIS — M1990 Unspecified osteoarthritis, unspecified site: Secondary | ICD-10-CM | POA: Diagnosis not present

## 2014-09-06 DIAGNOSIS — G629 Polyneuropathy, unspecified: Secondary | ICD-10-CM | POA: Diagnosis not present

## 2014-09-06 DIAGNOSIS — I69398 Other sequelae of cerebral infarction: Secondary | ICD-10-CM | POA: Diagnosis not present

## 2014-09-06 DIAGNOSIS — J439 Emphysema, unspecified: Secondary | ICD-10-CM | POA: Diagnosis not present

## 2014-09-06 DIAGNOSIS — H5441 Blindness, right eye, normal vision left eye: Secondary | ICD-10-CM | POA: Diagnosis not present

## 2014-09-11 DIAGNOSIS — H5441 Blindness, right eye, normal vision left eye: Secondary | ICD-10-CM | POA: Diagnosis not present

## 2014-09-11 DIAGNOSIS — J439 Emphysema, unspecified: Secondary | ICD-10-CM | POA: Diagnosis not present

## 2014-09-11 DIAGNOSIS — I69398 Other sequelae of cerebral infarction: Secondary | ICD-10-CM | POA: Diagnosis not present

## 2014-09-11 DIAGNOSIS — G629 Polyneuropathy, unspecified: Secondary | ICD-10-CM | POA: Diagnosis not present

## 2014-09-11 DIAGNOSIS — M1990 Unspecified osteoarthritis, unspecified site: Secondary | ICD-10-CM | POA: Diagnosis not present

## 2014-09-11 DIAGNOSIS — I10 Essential (primary) hypertension: Secondary | ICD-10-CM | POA: Diagnosis not present

## 2014-09-13 DIAGNOSIS — I10 Essential (primary) hypertension: Secondary | ICD-10-CM | POA: Diagnosis not present

## 2014-09-13 DIAGNOSIS — G629 Polyneuropathy, unspecified: Secondary | ICD-10-CM | POA: Diagnosis not present

## 2014-09-13 DIAGNOSIS — I69398 Other sequelae of cerebral infarction: Secondary | ICD-10-CM | POA: Diagnosis not present

## 2014-09-13 DIAGNOSIS — H5441 Blindness, right eye, normal vision left eye: Secondary | ICD-10-CM | POA: Diagnosis not present

## 2014-09-13 DIAGNOSIS — M1990 Unspecified osteoarthritis, unspecified site: Secondary | ICD-10-CM | POA: Diagnosis not present

## 2014-09-13 DIAGNOSIS — J439 Emphysema, unspecified: Secondary | ICD-10-CM | POA: Diagnosis not present

## 2014-09-18 DIAGNOSIS — M1990 Unspecified osteoarthritis, unspecified site: Secondary | ICD-10-CM | POA: Diagnosis not present

## 2014-09-18 DIAGNOSIS — G629 Polyneuropathy, unspecified: Secondary | ICD-10-CM | POA: Diagnosis not present

## 2014-09-18 DIAGNOSIS — I10 Essential (primary) hypertension: Secondary | ICD-10-CM | POA: Diagnosis not present

## 2014-09-18 DIAGNOSIS — I69398 Other sequelae of cerebral infarction: Secondary | ICD-10-CM | POA: Diagnosis not present

## 2014-09-18 DIAGNOSIS — H5441 Blindness, right eye, normal vision left eye: Secondary | ICD-10-CM | POA: Diagnosis not present

## 2014-09-18 DIAGNOSIS — J439 Emphysema, unspecified: Secondary | ICD-10-CM | POA: Diagnosis not present

## 2014-09-20 DIAGNOSIS — I10 Essential (primary) hypertension: Secondary | ICD-10-CM | POA: Diagnosis not present

## 2014-09-20 DIAGNOSIS — J439 Emphysema, unspecified: Secondary | ICD-10-CM | POA: Diagnosis not present

## 2014-09-20 DIAGNOSIS — M1990 Unspecified osteoarthritis, unspecified site: Secondary | ICD-10-CM | POA: Diagnosis not present

## 2014-09-20 DIAGNOSIS — I69398 Other sequelae of cerebral infarction: Secondary | ICD-10-CM | POA: Diagnosis not present

## 2014-09-20 DIAGNOSIS — G629 Polyneuropathy, unspecified: Secondary | ICD-10-CM | POA: Diagnosis not present

## 2014-09-20 DIAGNOSIS — H5441 Blindness, right eye, normal vision left eye: Secondary | ICD-10-CM | POA: Diagnosis not present

## 2014-09-24 DIAGNOSIS — H3531 Nonexudative age-related macular degeneration: Secondary | ICD-10-CM | POA: Diagnosis not present

## 2014-09-24 DIAGNOSIS — H34811 Central retinal vein occlusion, right eye: Secondary | ICD-10-CM | POA: Diagnosis not present

## 2014-09-24 DIAGNOSIS — Z961 Presence of intraocular lens: Secondary | ICD-10-CM | POA: Diagnosis not present

## 2014-10-01 DIAGNOSIS — J439 Emphysema, unspecified: Secondary | ICD-10-CM | POA: Diagnosis not present

## 2014-10-01 DIAGNOSIS — I10 Essential (primary) hypertension: Secondary | ICD-10-CM | POA: Diagnosis not present

## 2014-10-01 DIAGNOSIS — M1990 Unspecified osteoarthritis, unspecified site: Secondary | ICD-10-CM | POA: Diagnosis not present

## 2014-10-01 DIAGNOSIS — G629 Polyneuropathy, unspecified: Secondary | ICD-10-CM | POA: Diagnosis not present

## 2014-10-12 DIAGNOSIS — H35372 Puckering of macula, left eye: Secondary | ICD-10-CM | POA: Diagnosis not present

## 2014-10-12 DIAGNOSIS — H3411 Central retinal artery occlusion, right eye: Secondary | ICD-10-CM | POA: Diagnosis not present

## 2014-10-19 DIAGNOSIS — Z23 Encounter for immunization: Secondary | ICD-10-CM | POA: Diagnosis not present

## 2015-01-16 DIAGNOSIS — R269 Unspecified abnormalities of gait and mobility: Secondary | ICD-10-CM | POA: Diagnosis not present

## 2015-01-16 DIAGNOSIS — I6523 Occlusion and stenosis of bilateral carotid arteries: Secondary | ICD-10-CM | POA: Diagnosis not present

## 2015-01-16 DIAGNOSIS — R7303 Prediabetes: Secondary | ICD-10-CM | POA: Diagnosis not present

## 2015-01-16 DIAGNOSIS — I771 Stricture of artery: Secondary | ICD-10-CM | POA: Diagnosis not present

## 2015-01-16 DIAGNOSIS — M8580 Other specified disorders of bone density and structure, unspecified site: Secondary | ICD-10-CM | POA: Diagnosis not present

## 2015-01-16 DIAGNOSIS — F339 Major depressive disorder, recurrent, unspecified: Secondary | ICD-10-CM | POA: Diagnosis not present

## 2015-01-17 ENCOUNTER — Other Ambulatory Visit: Payer: Self-pay | Admitting: Internal Medicine

## 2015-01-17 DIAGNOSIS — I6529 Occlusion and stenosis of unspecified carotid artery: Secondary | ICD-10-CM

## 2015-01-25 ENCOUNTER — Emergency Department (HOSPITAL_COMMUNITY): Payer: Medicare Other

## 2015-01-25 ENCOUNTER — Encounter (HOSPITAL_COMMUNITY): Payer: Self-pay | Admitting: Emergency Medicine

## 2015-01-25 ENCOUNTER — Inpatient Hospital Stay (HOSPITAL_COMMUNITY)
Admission: EM | Admit: 2015-01-25 | Discharge: 2015-01-30 | DRG: 510 | Disposition: A | Discharge status: Home / Self Care | Payer: Medicare Other | Attending: General Surgery | Admitting: General Surgery

## 2015-01-25 DIAGNOSIS — S0240FA Zygomatic fracture, left side, initial encounter for closed fracture: Secondary | ICD-10-CM | POA: Diagnosis present

## 2015-01-25 DIAGNOSIS — Z881 Allergy status to other antibiotic agents status: Secondary | ICD-10-CM

## 2015-01-25 DIAGNOSIS — Z7902 Long term (current) use of antithrombotics/antiplatelets: Secondary | ICD-10-CM

## 2015-01-25 DIAGNOSIS — S52591A Other fractures of lower end of right radius, initial encounter for closed fracture: Secondary | ICD-10-CM | POA: Diagnosis not present

## 2015-01-25 DIAGNOSIS — S0292XA Unspecified fracture of facial bones, initial encounter for closed fracture: Secondary | ICD-10-CM | POA: Diagnosis not present

## 2015-01-25 DIAGNOSIS — S0232XA Fracture of orbital floor, left side, initial encounter for closed fracture: Secondary | ICD-10-CM

## 2015-01-25 DIAGNOSIS — Z91041 Radiographic dye allergy status: Secondary | ICD-10-CM | POA: Diagnosis not present

## 2015-01-25 DIAGNOSIS — Z79899 Other long term (current) drug therapy: Secondary | ICD-10-CM

## 2015-01-25 DIAGNOSIS — S0633AA Contusion and laceration of cerebrum, unspecified, with loss of consciousness status unknown, initial encounter: Secondary | ICD-10-CM | POA: Diagnosis present

## 2015-01-25 DIAGNOSIS — S062X9A Diffuse traumatic brain injury with loss of consciousness of unspecified duration, initial encounter: Secondary | ICD-10-CM | POA: Diagnosis present

## 2015-01-25 DIAGNOSIS — S199XXA Unspecified injury of neck, initial encounter: Secondary | ICD-10-CM | POA: Diagnosis not present

## 2015-01-25 DIAGNOSIS — Z978 Presence of other specified devices: Secondary | ICD-10-CM

## 2015-01-25 DIAGNOSIS — S0990XA Unspecified injury of head, initial encounter: Secondary | ICD-10-CM | POA: Diagnosis not present

## 2015-01-25 DIAGNOSIS — S06339A Contusion and laceration of cerebrum, unspecified, with loss of consciousness of unspecified duration, initial encounter: Secondary | ICD-10-CM | POA: Diagnosis present

## 2015-01-25 DIAGNOSIS — I2510 Atherosclerotic heart disease of native coronary artery without angina pectoris: Secondary | ICD-10-CM | POA: Diagnosis present

## 2015-01-25 DIAGNOSIS — S0280XA Fracture of other specified skull and facial bones, unspecified side, initial encounter for closed fracture: Secondary | ICD-10-CM | POA: Diagnosis not present

## 2015-01-25 DIAGNOSIS — T148 Other injury of unspecified body region: Secondary | ICD-10-CM | POA: Diagnosis not present

## 2015-01-25 DIAGNOSIS — W19XXXA Unspecified fall, initial encounter: Secondary | ICD-10-CM | POA: Diagnosis present

## 2015-01-25 DIAGNOSIS — S62102A Fracture of unspecified carpal bone, left wrist, initial encounter for closed fracture: Secondary | ICD-10-CM

## 2015-01-25 DIAGNOSIS — S065X9A Traumatic subdural hemorrhage with loss of consciousness of unspecified duration, initial encounter: Secondary | ICD-10-CM

## 2015-01-25 DIAGNOSIS — S62121A Displaced fracture of lunate [semilunar], right wrist, initial encounter for closed fracture: Secondary | ICD-10-CM | POA: Diagnosis not present

## 2015-01-25 DIAGNOSIS — K219 Gastro-esophageal reflux disease without esophagitis: Secondary | ICD-10-CM | POA: Diagnosis present

## 2015-01-25 DIAGNOSIS — S06890A Other specified intracranial injury without loss of consciousness, initial encounter: Secondary | ICD-10-CM | POA: Diagnosis not present

## 2015-01-25 DIAGNOSIS — Z888 Allergy status to other drugs, medicaments and biological substances status: Secondary | ICD-10-CM

## 2015-01-25 DIAGNOSIS — W1830XA Fall on same level, unspecified, initial encounter: Secondary | ICD-10-CM | POA: Diagnosis present

## 2015-01-25 DIAGNOSIS — Z8673 Personal history of transient ischemic attack (TIA), and cerebral infarction without residual deficits: Secondary | ICD-10-CM

## 2015-01-25 DIAGNOSIS — S52551B Other extraarticular fracture of lower end of right radius, initial encounter for open fracture type I or II: Secondary | ICD-10-CM | POA: Diagnosis not present

## 2015-01-25 DIAGNOSIS — S0230XA Fracture of orbital floor, unspecified side, initial encounter for closed fracture: Secondary | ICD-10-CM | POA: Diagnosis not present

## 2015-01-25 DIAGNOSIS — S62122A Displaced fracture of lunate [semilunar], left wrist, initial encounter for closed fracture: Secondary | ICD-10-CM | POA: Diagnosis not present

## 2015-01-25 DIAGNOSIS — I129 Hypertensive chronic kidney disease with stage 1 through stage 4 chronic kidney disease, or unspecified chronic kidney disease: Secondary | ICD-10-CM | POA: Diagnosis present

## 2015-01-25 DIAGNOSIS — H5441 Blindness, right eye, normal vision left eye: Secondary | ICD-10-CM | POA: Diagnosis present

## 2015-01-25 DIAGNOSIS — R40241 Glasgow coma scale score 13-15, unspecified time: Secondary | ICD-10-CM | POA: Diagnosis present

## 2015-01-25 DIAGNOSIS — Y92002 Bathroom of unspecified non-institutional (private) residence single-family (private) house as the place of occurrence of the external cause: Secondary | ICD-10-CM | POA: Diagnosis not present

## 2015-01-25 DIAGNOSIS — S065XAA Traumatic subdural hemorrhage with loss of consciousness status unknown, initial encounter: Secondary | ICD-10-CM

## 2015-01-25 DIAGNOSIS — S62101A Fracture of unspecified carpal bone, right wrist, initial encounter for closed fracture: Secondary | ICD-10-CM

## 2015-01-25 DIAGNOSIS — I739 Peripheral vascular disease, unspecified: Secondary | ICD-10-CM | POA: Diagnosis present

## 2015-01-25 DIAGNOSIS — R51 Headache: Secondary | ICD-10-CM | POA: Diagnosis not present

## 2015-01-25 DIAGNOSIS — Z886 Allergy status to analgesic agent status: Secondary | ICD-10-CM | POA: Diagnosis not present

## 2015-01-25 DIAGNOSIS — Z87891 Personal history of nicotine dependence: Secondary | ICD-10-CM | POA: Diagnosis not present

## 2015-01-25 DIAGNOSIS — S299XXA Unspecified injury of thorax, initial encounter: Secondary | ICD-10-CM | POA: Diagnosis not present

## 2015-01-25 DIAGNOSIS — Z880 Allergy status to penicillin: Secondary | ICD-10-CM | POA: Diagnosis not present

## 2015-01-25 DIAGNOSIS — R52 Pain, unspecified: Secondary | ICD-10-CM | POA: Diagnosis not present

## 2015-01-25 DIAGNOSIS — S52501A Unspecified fracture of the lower end of right radius, initial encounter for closed fracture: Secondary | ICD-10-CM | POA: Diagnosis present

## 2015-01-25 DIAGNOSIS — S6412XA Injury of median nerve at wrist and hand level of left arm, initial encounter: Secondary | ICD-10-CM | POA: Diagnosis not present

## 2015-01-25 DIAGNOSIS — S0240DA Maxillary fracture, left side, initial encounter for closed fracture: Secondary | ICD-10-CM | POA: Diagnosis present

## 2015-01-25 DIAGNOSIS — F329 Major depressive disorder, single episode, unspecified: Secondary | ICD-10-CM | POA: Diagnosis present

## 2015-01-25 DIAGNOSIS — H2513 Age-related nuclear cataract, bilateral: Secondary | ICD-10-CM | POA: Diagnosis not present

## 2015-01-25 DIAGNOSIS — R4182 Altered mental status, unspecified: Secondary | ICD-10-CM

## 2015-01-25 DIAGNOSIS — Z885 Allergy status to narcotic agent status: Secondary | ICD-10-CM | POA: Diagnosis not present

## 2015-01-25 DIAGNOSIS — S01112A Laceration without foreign body of left eyelid and periocular area, initial encounter: Secondary | ICD-10-CM | POA: Diagnosis present

## 2015-01-25 DIAGNOSIS — Z91013 Allergy to seafood: Secondary | ICD-10-CM | POA: Diagnosis not present

## 2015-01-25 DIAGNOSIS — R55 Syncope and collapse: Secondary | ICD-10-CM

## 2015-01-25 DIAGNOSIS — I609 Nontraumatic subarachnoid hemorrhage, unspecified: Secondary | ICD-10-CM

## 2015-01-25 DIAGNOSIS — S52502A Unspecified fracture of the lower end of left radius, initial encounter for closed fracture: Secondary | ICD-10-CM | POA: Diagnosis not present

## 2015-01-25 DIAGNOSIS — S52532A Colles' fracture of left radius, initial encounter for closed fracture: Principal | ICD-10-CM | POA: Diagnosis present

## 2015-01-25 DIAGNOSIS — S52551A Other extraarticular fracture of lower end of right radius, initial encounter for closed fracture: Secondary | ICD-10-CM | POA: Diagnosis not present

## 2015-01-25 DIAGNOSIS — M25532 Pain in left wrist: Secondary | ICD-10-CM | POA: Diagnosis not present

## 2015-01-25 DIAGNOSIS — S066X0A Traumatic subarachnoid hemorrhage without loss of consciousness, initial encounter: Secondary | ICD-10-CM | POA: Diagnosis not present

## 2015-01-25 DIAGNOSIS — Z90710 Acquired absence of both cervix and uterus: Secondary | ICD-10-CM

## 2015-01-25 DIAGNOSIS — S069X9A Unspecified intracranial injury with loss of consciousness of unspecified duration, initial encounter: Secondary | ICD-10-CM

## 2015-01-25 DIAGNOSIS — S069XAA Unspecified intracranial injury with loss of consciousness status unknown, initial encounter: Secondary | ICD-10-CM | POA: Diagnosis present

## 2015-01-25 DIAGNOSIS — S0181XA Laceration without foreign body of other part of head, initial encounter: Secondary | ICD-10-CM | POA: Diagnosis present

## 2015-01-25 DIAGNOSIS — S52572A Other intraarticular fracture of lower end of left radius, initial encounter for closed fracture: Secondary | ICD-10-CM | POA: Diagnosis not present

## 2015-01-25 DIAGNOSIS — S52572B Other intraarticular fracture of lower end of left radius, initial encounter for open fracture type I or II: Secondary | ICD-10-CM | POA: Diagnosis not present

## 2015-01-25 DIAGNOSIS — S06899A Other specified intracranial injury with loss of consciousness of unspecified duration, initial encounter: Secondary | ICD-10-CM | POA: Diagnosis not present

## 2015-01-25 LAB — CBC WITH DIFFERENTIAL/PLATELET
BASOS PCT: 0 %
Basophils Absolute: 0 10*3/uL (ref 0.0–0.1)
Eosinophils Absolute: 0.1 10*3/uL (ref 0.0–0.7)
Eosinophils Relative: 1 %
HEMATOCRIT: 40.1 % (ref 36.0–46.0)
Hemoglobin: 13.1 g/dL (ref 12.0–15.0)
LYMPHS ABS: 0.9 10*3/uL (ref 0.7–4.0)
Lymphocytes Relative: 7 %
MCH: 29.6 pg (ref 26.0–34.0)
MCHC: 32.7 g/dL (ref 30.0–36.0)
MCV: 90.7 fL (ref 78.0–100.0)
MONO ABS: 1.1 10*3/uL — AB (ref 0.1–1.0)
MONOS PCT: 9 %
NEUTROS ABS: 9.8 10*3/uL — AB (ref 1.7–7.7)
Neutrophils Relative %: 83 %
Platelets: 169 10*3/uL (ref 150–400)
RBC: 4.42 MIL/uL (ref 3.87–5.11)
RDW: 13.8 % (ref 11.5–15.5)
WBC: 11.9 10*3/uL — ABNORMAL HIGH (ref 4.0–10.5)

## 2015-01-25 LAB — BASIC METABOLIC PANEL
ANION GAP: 9 (ref 5–15)
BUN: 15 mg/dL (ref 6–20)
CALCIUM: 9.5 mg/dL (ref 8.9–10.3)
CHLORIDE: 107 mmol/L (ref 101–111)
CO2: 27 mmol/L (ref 22–32)
CREATININE: 0.95 mg/dL (ref 0.44–1.00)
GFR calc Af Amer: >60 mL/min (ref >60)
GFR calc non Af Amer: 53 mL/min — ABNORMAL LOW (ref >60)
GLUCOSE: 133 mg/dL — AB (ref 65–99)
Potassium: 3.8 mmol/L (ref 3.5–5.1)
Sodium: 143 mmol/L (ref 135–145)

## 2015-01-25 LAB — TROPONIN I: Troponin I: <0.03 ng/mL (ref <0.031)

## 2015-01-25 MED ORDER — FENTANYL CITRATE (PF) 100 MCG/2ML IJ SOLN
50.0000 ug | Freq: Once | INTRAMUSCULAR | Status: AC
  Administered 2015-01-26: 50 ug via INTRAVENOUS
  Filled 2015-01-25: qty 2

## 2015-01-25 NOTE — ED Notes (Signed)
Ralene Bathe, MD and surgery MD at bedside.

## 2015-01-25 NOTE — H&P (Addendum)
History   Allison [name] is an 81 y.o. female.   Chief Complaint:  Chief Complaint  Patient presents with  . Fall  . Wrist Pain  . Loss of Consciousness    Fall Pertinent negatives include no abdominal pain, chest pain, coughing, headaches, myalgias, nausea, neck pain or vomiting.  Wrist Pain Pertinent negatives include no abdominal pain, chest pain, coughing, headaches, myalgias, nausea, neck pain or vomiting.  Loss of Consciousness Associated symptoms: no chest pain, no dizziness, no focal weakness, no headaches, no nausea, no shortness of breath and no vomiting   This is an 26-yo female with multiple medical issues who presents after a fall in the bathroom at home.  Questionable LOC.  Patient is amnestic for the exact circumstances of the fall.  She has swelling and laceration over/ around the left orbit, bilateral wrist deformities.  She was evaluated by the EDP and has multiple injuries.  We are asked to admit the patient due to multi-system trauma.  She reports no chest pain, shortness of breath, or abdominal pain.    She is anticoagulated on Plavix for recurrent TIA's.  She lives at home with her elderly husband.  Her son is a Engineer, drilling.  Past Medical History  Diagnosis Date  . HTN (hypertension)   . Aortic aneurysm ([place])   . Transient ischemic attack   . Murmur, cardiac   . Cholelithiasis   . Internal hemorrhoids   . Irritable bowel syndrome   . Depression   . Eustachian tube dysfunction     pt denies  . Appendicitis   . CVA (cerebral infarction)     Right retinal branch occlusion  . Carotid artery plaque     hx of per left side   . Stroke ([place])   . Legally blind in right eye, as defined in [place]     related to past CVA  . Anxiety   . Chronic kidney disease     hx of kidney stones  . GERD (gastroesophageal reflux disease)   . Allergy   . Arthritis   . Cataract   . Clotting disorder ([place])   . Osteoporosis     Past Surgical History  Procedure Laterality Date   . Abdominal aortic aneurysm repair  2005  . Kidney stones removed    . Appendectomy    . Abdominal hysterectomy    . Cholecystectomy    . Shoulder surgery      Left  . Wrist surgery      Right  . Tee without cardioversion  12/18/2011    Procedure: TRANSESOPHAGEAL ECHOCARDIOGRAM (TEE);  Surgeon: Larey Dresser, MD;  Location: [facility];  Service: Cardiovascular;  Laterality: N/A;  . Eye surgery      hx of lazy eye     Family History  Problem Relation Age of Onset  . Asthma Brother     died age 2  . Diabetes Mother   . Coronary artery disease Mother     died age 38  . Coronary artery disease Father     died age 94  . Coronary artery disease Brother     died age 22  . Coronary artery disease Sister     died age 25   Social History:  reports that she quit smoking about 9 months ago. Her smoking use included Cigarettes. She has a 10 pack-year smoking history. She has never used smokeless tobacco. She reports that she does not drink alcohol or use illicit drugs.  Allergies  Allergies  Allergen Reactions  . Shellfish Allergy Shortness Of Breath and Rash    All seafood  . Xylocaine [Lidocaine] Shortness Of Breath  . Biaxin [Clarithromycin]     unknown  . Celebrex [Celecoxib]     Unknown   . Cozaar [Losartan]     High blood pressure  . Demerol [Meperidine]     unknown  . Elavil [Amitriptyline]     unknown  . Fosamax [Alendronate]     unknown  . Hydromorphone     Hives   . Inderal [Propranolol]     unknown  . Ivp Dye [Iodinated Diagnostic Agents]     rash  . Levaquin [Levofloxacin]     unknown  . Macrodantin [Nitrofurantoin]     unknown  . Mycinette [Phenol]     Unknown   . Neggram [Nalidixic Acid]     unknown  . Novocain [Procaine]     unknown  . Pamelor [Nortriptyline]     unknown  . Penicillins     rash  . Robaxin [Methocarbamol]     unknown  . Tamiflu [Oseltamivir]     Unknown   . Tenormin [Atenolol]     unknown  . Triavil  [Perphenazine-Amitriptyline]     Unknown   . Ampicillin Rash  . Paxil [Paroxetine] Rash  . Wellbutrin [Bupropion] Rash  . Zoloft [Sertraline] Rash    Home Medications   Prior to Admission medications   Medication Sig Start Date End Date Taking? Authorizing Provider  amLODipine (NORVASC) 5 MG tablet Take 5 mg by mouth every morning.     Historical Provider, MD  Cholecalciferol (VITAMIN D PO) Take 1 tablet by mouth every morning.     Historical Provider, MD  clopidogrel (PLAVIX) 75 MG tablet Take 1 tablet by mouth every morning.  10/28/12   Historical Provider, MD  Coenzyme Q10 (CO Q-10) 50 MG CAPS Take 1 capsule by mouth every morning. Pt states takes 30 mgs    Historical Provider, MD  cycloSPORINE (RESTASIS) 0.05 % ophthalmic emulsion Place 1 drop into both eyes 2 (two) times daily.    Historical Provider, MD  diazepam (VALIUM) 2 MG tablet Take 1-2 mg by mouth every 12 (twelve) hours as needed for anxiety.     Historical Provider, MD  escitalopram (LEXAPRO) 20 MG tablet Take 20 mg by mouth daily.  07/06/14   Historical Provider, MD  ezetimibe (ZETIA) 10 MG tablet Take 5 mg by mouth at bedtime.     Historical Provider, MD  OLANZapine (ZYPREXA) 10 MG tablet Take 10 mg by mouth daily. 07/07/14   Historical Provider, MD  pantoprazole (PROTONIX) 40 MG tablet Take 40 mg by mouth every morning.     Historical Provider, MD  Polyethyl Glycol-Propyl Glycol (SYSTANE FREE OP) Place 1 drop into both eyes daily as needed (dry eyes).     Historical Provider, MD  rosuvastatin (CRESTOR) 10 MG tablet Take 10 mg by mouth at bedtime.     Historical Provider, MD     Trauma Course  No results found for this or any previous visit (from the past 48 hour(s)). Dg Chest 1 View  01/25/2015  CLINICAL DATA:  Status post fall from standing, with concern for chest injury. Initial encounter. EXAM: CHEST 1 VIEW COMPARISON:  Chest radiograph performed 01/29/2014 FINDINGS: The lungs are well-aerated and clear. There is no  evidence of focal opacification, pleural effusion or pneumothorax. The cardiomediastinal silhouette is borderline normal in size. No acute osseous abnormalities are seen. IMPRESSION: No  acute cardiopulmonary process seen. No displaced rib fractures identified. Electronically Signed   By: Garald Balding M.D.   On: 01/25/2015 22:38   Dg Wrist Complete Left  01/25/2015  CLINICAL DATA:  Fall today.  Wrist pain, swelling and deformity. EXAM: LEFT WRIST - COMPLETE 3+ VIEW COMPARISON:  None. FINDINGS: There are acute fractures of the distal radius and ulna. The radial fracture demonstrates significant posterior displacement and apex anterior angulation. Intra-articular extension of this fracture is difficult to exclude. The fracture does extend into the distal radioulnar joint which is mildly widened. There is a fracture of the ulnar styloid. The carpal bones appear intact and still wall articulate with the distal radius on the lateral view. IMPRESSION: Impacted and posteriorly displaced fractures of the distal radius, most consistent with a Colles fracture. Involvement of the distal articular surface of the radius is difficult to completely exclude. Ulnar styloid fracture. Electronically Signed   By: Richardean Sale M.D.   On: 01/25/2015 22:38   Dg Wrist Complete Right  01/25/2015  CLINICAL DATA:  Status post fall from standing, with right wrist swelling. Initial encounter. EXAM: RIGHT WRIST - COMPLETE 3+ VIEW COMPARISON:  None. FINDINGS: There is a mildly comminuted fracture involving the distal radial metaphysis, with suggestion of extension to the radiocarpal joint. Minimal dorsal tilt is suggested. The carpal rows are intact, and demonstrate normal alignment. Soft tissue swelling is noted about the fracture sites. IMPRESSION: Mildly comminuted fracture involving the distal radial metaphysis, with suggestion of extension to the radiocarpal joint. Minimal dorsal tilt suggested, without significant displacement.  Electronically Signed   By: Garald Balding M.D.   On: 01/25/2015 22:37   Ct Head Wo Contrast  01/25/2015  ADDENDUM REPORT: 01/25/2015 23:06 ADDENDUM: Please note there is a small left frontal hemorrhage likely combination of a small subdural and subarachnoid. Left frontal cortical contusion also noted. A 5 mm round focus over the right parietal convexity (series 201, image 22) may also represent focal cortical contusion. Left occipital scalp hematoma. These results were called by telephone at the time of interpretation on 01/25/2015 at 11:06 pm to Dr. Sherian Maroon , who verbally acknowledged these results. Electronically Signed   By: Anner Crete M.D.   On: 01/25/2015 23:06  01/25/2015  CLINICAL DATA:  81 year old female with fall and left leg pain. EXAM: CT HEAD WITHOUT CONTRAST CT MAXILLOFACIAL WITHOUT CONTRAST CT CERVICAL SPINE WITHOUT CONTRAST TECHNIQUE: Multidetector CT imaging of the head, cervical spine, and maxillofacial structures were performed using the standard protocol without intravenous contrast. Multiplanar CT image reconstructions of the cervical spine and maxillofacial structures were also generated. COMPARISON:  Head CT dated 04/14/2014 FINDINGS: CT HEAD FINDINGS The ventricles are dilated and the sulci are prominent compatible with age-related atrophy. Periventricular and deep white matter hypodensities represent chronic microvascular ischemic changes. There is no intracranial hemorrhage. No mass effect or midline shift identified. The visualized mastoid air cells are well aerated. There is opacification of the visualized left maxillary sinus and partial opacification of the left sphenoid sinus. Left periorbital hematoma. The calvarium is intact. CT MAXILLOFACIAL FINDINGS The mandible is intact. There is a nondisplaced fracture of the left zygomatic arch. There are fractures of the posterior lateral wall of the left maxillary sinus. There is fracture of the left orbital floor with  fracture of the left inferior orbital rim. There is apparent abutment of the left inferior rectus muscle to the orbital floor fracture. Clinical correlation is recommended to evaluate for ocular muscle entrapment. There is  a dysconjugate gaze. There is slight ovoid appearance of the left globe. Correlation with ophthalmological exam recommended. There is stranding of the left retro-orbital fat . Linear high density noted along the left orbital floor as well as along the roof of the left orbit compatible with hematoma. The lamina papyracea is intact. The right orbit, right retro-orbital fat, and right orbital walls are intact. The nasal bone and nasal septum is intact. The pterygoid plates appear unremarkable. There is left periorbital hematoma. There is near complete opacification of the left maxillary sinus with partial opacification of the left sphenoid sinus. Remainder of the visualized paranasal sinuses and mastoid air cells are well aerated. CT CERVICAL SPINE FINDINGS There is no acute fracture or subluxation of the cervical spine.There is osteopenia with multilevel degenerative changes of the spine.The odontoid and spinous processes are intact.There is normal anatomic alignment of the C1-C2 lateral masses. The visualized soft tissues appear unremarkable. There is a 0.8 x 0.7 cm ground-glass nodular density in the right apical region (series 402 image 76). IMPRESSION: No acute intracranial hemorrhage. Age-related atrophy and chronic microvascular ischemic disease. No acute/traumatic cervical spine pathology. Left facial and orbital fractures as described. There is slight oval appearance of the left globe. Correlation with ophthalmological exam recommended. Electronically Signed: By: Anner Crete M.D. On: 01/25/2015 22:49   Ct Cervical Spine Wo Contrast  01/25/2015  ADDENDUM REPORT: 01/25/2015 23:06 ADDENDUM: Please note there is a small left frontal hemorrhage likely combination of a small subdural and  subarachnoid. Left frontal cortical contusion also noted. A 5 mm round focus over the right parietal convexity (series 201, image 22) may also represent focal cortical contusion. Left occipital scalp hematoma. These results were called by telephone at the time of interpretation on 01/25/2015 at 11:06 pm to Dr. Sherian Maroon , who verbally acknowledged these results. Electronically Signed   By: Anner Crete M.D.   On: 01/25/2015 23:06  01/25/2015  CLINICAL DATA:  81 year old female with fall and left leg pain. EXAM: CT HEAD WITHOUT CONTRAST CT MAXILLOFACIAL WITHOUT CONTRAST CT CERVICAL SPINE WITHOUT CONTRAST TECHNIQUE: Multidetector CT imaging of the head, cervical spine, and maxillofacial structures were performed using the standard protocol without intravenous contrast. Multiplanar CT image reconstructions of the cervical spine and maxillofacial structures were also generated. COMPARISON:  Head CT dated 04/14/2014 FINDINGS: CT HEAD FINDINGS The ventricles are dilated and the sulci are prominent compatible with age-related atrophy. Periventricular and deep white matter hypodensities represent chronic microvascular ischemic changes. There is no intracranial hemorrhage. No mass effect or midline shift identified. The visualized mastoid air cells are well aerated. There is opacification of the visualized left maxillary sinus and partial opacification of the left sphenoid sinus. Left periorbital hematoma. The calvarium is intact. CT MAXILLOFACIAL FINDINGS The mandible is intact. There is a nondisplaced fracture of the left zygomatic arch. There are fractures of the posterior lateral wall of the left maxillary sinus. There is fracture of the left orbital floor with fracture of the left inferior orbital rim. There is apparent abutment of the left inferior rectus muscle to the orbital floor fracture. Clinical correlation is recommended to evaluate for ocular muscle entrapment. There is a dysconjugate gaze. There is  slight ovoid appearance of the left globe. Correlation with ophthalmological exam recommended. There is stranding of the left retro-orbital fat . Linear high density noted along the left orbital floor as well as along the roof of the left orbit compatible with hematoma. The lamina papyracea is intact. The right orbit,  right retro-orbital fat, and right orbital walls are intact. The nasal bone and nasal septum is intact. The pterygoid plates appear unremarkable. There is left periorbital hematoma. There is near complete opacification of the left maxillary sinus with partial opacification of the left sphenoid sinus. Remainder of the visualized paranasal sinuses and mastoid air cells are well aerated. CT CERVICAL SPINE FINDINGS There is no acute fracture or subluxation of the cervical spine.There is osteopenia with multilevel degenerative changes of the spine.The odontoid and spinous processes are intact.There is normal anatomic alignment of the C1-C2 lateral masses. The visualized soft tissues appear unremarkable. There is a 0.8 x 0.7 cm ground-glass nodular density in the right apical region (series 402 image 76). IMPRESSION: No acute intracranial hemorrhage. Age-related atrophy and chronic microvascular ischemic disease. No acute/traumatic cervical spine pathology. Left facial and orbital fractures as described. There is slight oval appearance of the left globe. Correlation with ophthalmological exam recommended. Electronically Signed: By: Anner Crete M.D. On: 01/25/2015 22:49   Ct Maxillofacial Wo Cm  01/25/2015  ADDENDUM REPORT: 01/25/2015 23:06 ADDENDUM: Please note there is a small left frontal hemorrhage likely combination of a small subdural and subarachnoid. Left frontal cortical contusion also noted. A 5 mm round focus over the right parietal convexity (series 201, image 22) may also represent focal cortical contusion. Left occipital scalp hematoma. These results were called by telephone at the time  of interpretation on 01/25/2015 at 11:06 pm to Dr. Sherian Maroon , who verbally acknowledged these results. Electronically Signed   By: Anner Crete M.D.   On: 01/25/2015 23:06  01/25/2015  CLINICAL DATA:  81 year old female with fall and left leg pain. EXAM: CT HEAD WITHOUT CONTRAST CT MAXILLOFACIAL WITHOUT CONTRAST CT CERVICAL SPINE WITHOUT CONTRAST TECHNIQUE: Multidetector CT imaging of the head, cervical spine, and maxillofacial structures were performed using the standard protocol without intravenous contrast. Multiplanar CT image reconstructions of the cervical spine and maxillofacial structures were also generated. COMPARISON:  Head CT dated 04/14/2014 FINDINGS: CT HEAD FINDINGS The ventricles are dilated and the sulci are prominent compatible with age-related atrophy. Periventricular and deep white matter hypodensities represent chronic microvascular ischemic changes. There is no intracranial hemorrhage. No mass effect or midline shift identified. The visualized mastoid air cells are well aerated. There is opacification of the visualized left maxillary sinus and partial opacification of the left sphenoid sinus. Left periorbital hematoma. The calvarium is intact. CT MAXILLOFACIAL FINDINGS The mandible is intact. There is a nondisplaced fracture of the left zygomatic arch. There are fractures of the posterior lateral wall of the left maxillary sinus. There is fracture of the left orbital floor with fracture of the left inferior orbital rim. There is apparent abutment of the left inferior rectus muscle to the orbital floor fracture. Clinical correlation is recommended to evaluate for ocular muscle entrapment. There is a dysconjugate gaze. There is slight ovoid appearance of the left globe. Correlation with ophthalmological exam recommended. There is stranding of the left retro-orbital fat . Linear high density noted along the left orbital floor as well as along the roof of the left orbit compatible with  hematoma. The lamina papyracea is intact. The right orbit, right retro-orbital fat, and right orbital walls are intact. The nasal bone and nasal septum is intact. The pterygoid plates appear unremarkable. There is left periorbital hematoma. There is near complete opacification of the left maxillary sinus with partial opacification of the left sphenoid sinus. Remainder of the visualized paranasal sinuses and mastoid air cells are well  aerated. CT CERVICAL SPINE FINDINGS There is no acute fracture or subluxation of the cervical spine.There is osteopenia with multilevel degenerative changes of the spine.The odontoid and spinous processes are intact.There is normal anatomic alignment of the C1-C2 lateral masses. The visualized soft tissues appear unremarkable. There is a 0.8 x 0.7 cm ground-glass nodular density in the right apical region (series 402 image 76). IMPRESSION: No acute intracranial hemorrhage. Age-related atrophy and chronic microvascular ischemic disease. No acute/traumatic cervical spine pathology. Left facial and orbital fractures as described. There is slight oval appearance of the left globe. Correlation with ophthalmological exam recommended. Electronically Signed: By: Anner Crete M.D. On: 01/25/2015 22:49    Review of Systems  Constitutional: Negative for weight loss.  HENT: Negative for ear discharge, ear pain, hearing loss and tinnitus.   Eyes: Positive for pain. Negative for blurred vision, double vision and photophobia.  Respiratory: Negative for cough, sputum production and shortness of breath.   Cardiovascular: Positive for syncope. Negative for chest pain.  Gastrointestinal: Negative for nausea, vomiting and abdominal pain.  Genitourinary: Negative for dysuria, urgency, frequency and flank pain.  Musculoskeletal: Positive for joint pain and falls. Negative for myalgias, back pain and neck pain.  Neurological: Negative for dizziness, tingling, sensory change, focal weakness,  loss of consciousness and headaches.  Endo/Heme/Allergies: Does not bruise/bleed easily.  Psychiatric/Behavioral: Negative for depression, memory loss and substance abuse. The patient is not nervous/anxious.     Blood pressure 163/68, pulse 64, temperature 98.6 F (37 C), temperature source Oral, resp. rate 23, height 5\' 1"  (1.549 m), weight 61.009 kg (134 lb 8 oz), SpO2 95 %. Physical Exam  Constitutional: She is oriented to person, place, and time.  Elderly female in NAD - awake, conversant, oriented   HENT:  Head: Normocephalic.  Left periorbital ecchymosis and edema with small laceration over eyebrow  Eyes:  Right eye - blind; preexisting Left eye - limited extraocular movement   Neck: Normal range of motion. Neck supple.  Cardiovascular: Normal rate and regular rhythm.   Respiratory: Effort normal and breath sounds normal.  GI: Soft. Bowel sounds are normal.  Musculoskeletal:  Left wrist deformity/ swelling/ tenderness  Right wrist swelling/ tenderness  Neurological: She is alert and oriented to person, place, and time.  Skin: Skin is warm and dry.     Assessment/Plan Fall from level ground  1.  Left frontal hemorrhage - possible small subdural/ subarachnoid; neurologically intact 2.  Left occipital scalp hematoma 3. Left zygomatic arch fracture 4.  Left maxillary sinus fracture 5.  Left inferior orbital fracture with probable inferior rectus muscle entrapment 6.  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  Left periorbital hematoma 8.  Right distal radius fracture with extension to the radiocarpal joint 9.  Left distal radius and ulna fracture with posterior displacement and angulation  Admit to Trauma ICU  Consultants:   Neurosurgery - [place]  Ophthalmology - [facility] - Burney Gauze  I discussed the CT findings and neuro exam with Dr. Ronnald Ramp.  He will see her in the morning.  According to the ED resident, Dr. Buelah Manis and Dr. Maylene Roes do  not feel that there is anything urgent that needs to be done for the orbital fracture and inferior rectus muscle entrapment.  Dr. Burney Gauze has been consulted re: bilateral wrist fractures.  Since she is allergic to Lidocaine, the EDP is unable to perform hematoma block and reduction in the ED.     [place] K. 01/25/2015, 11:17  PM   Procedures

## 2015-01-25 NOTE — ED Provider Notes (Signed)
CSN: [id number]     Arrival date & time 01/25/15  2131 History   First MD Initiated Contact with Patient 01/25/15 2149     Chief Complaint  Patient presents with  . Fall  . Wrist Pain  . Loss of Consciousness     (Consider location/radiation/quality/duration/timing/severity/associated sxs/prior Treatment) Patient is a 81 y.o. female presenting with fall, wrist pain, and syncope. The history is provided by the patient.  Fall This is a new problem. The current episode started today. The problem occurs rarely. The problem has been resolved. Associated symptoms include headaches. Pertinent negatives include no abdominal pain, chest pain, chills, fever, nausea, vomiting or weakness. The symptoms are aggravated by walking and standing. She has tried nothing for the symptoms. The treatment provided no relief.  Wrist Pain Associated symptoms include headaches. Pertinent negatives include no abdominal pain, chest pain, chills, fever, nausea, vomiting or weakness.  Loss of Consciousness Associated symptoms: headaches   Associated symptoms: no chest pain, no dizziness, no fever, no nausea, no palpitations, no shortness of breath, no vomiting and no weakness     Past Medical History  Diagnosis Date  . HTN (hypertension)   . Aortic aneurysm ([place])   . Transient ischemic attack   . Murmur, cardiac   . Cholelithiasis   . Internal hemorrhoids   . Irritable bowel syndrome   . Depression   . Eustachian tube dysfunction     pt denies  . Appendicitis   . CVA (cerebral infarction)     Right retinal branch occlusion  . Carotid artery plaque     hx of per left side   . Stroke ([place])   . Legally blind in right eye, as defined in [place]     related to past CVA  . Anxiety   . Chronic kidney disease     hx of kidney stones  . GERD (gastroesophageal reflux disease)   . Allergy   . Arthritis   . Cataract   . Clotting disorder ([place])   . Osteoporosis    Past Surgical History  Procedure Laterality  Date  . Abdominal aortic aneurysm repair  2005  . Kidney stones removed    . Appendectomy    . Abdominal hysterectomy    . Cholecystectomy    . Shoulder surgery      Left  . Wrist surgery      Right  . Tee without cardioversion  12/18/2011    Procedure: TRANSESOPHAGEAL ECHOCARDIOGRAM (TEE);  Surgeon: Larey Dresser, MD;  Location: [facility];  Service: Cardiovascular;  Laterality: N/A;  . Eye surgery      hx of lazy eye    Family History  Problem Relation Age of Onset  . Asthma Brother     died age 60  . Diabetes Mother   . Coronary artery disease Mother     died age 66  . Coronary artery disease Father     died age 52  . Coronary artery disease Brother     died age 62  . Coronary artery disease Sister     died age 45   Social History  Substance Use Topics  . Smoking status: Former Smoker -- 0.50 packs/day for 20 years    Types: Cigarettes    Quit date: 04/14/2014  . Smokeless tobacco: Never Used  . Alcohol Use: No   OB History    No data available     Review of Systems  Constitutional: Negative for fever and chills.  Eyes:  Positive for visual disturbance (left eye swollen). Negative for photophobia.  Respiratory: Negative for shortness of breath.   Cardiovascular: Positive for syncope. Negative for chest pain, palpitations and leg swelling.  Gastrointestinal: Negative for nausea, vomiting, abdominal pain, diarrhea, constipation and abdominal distention.  Genitourinary: Negative for dysuria, frequency, flank pain and decreased urine volume.  Musculoskeletal:       Wrist pain  Skin: Positive for wound (left forehead cut).  Neurological: Positive for headaches. Negative for dizziness, speech difficulty, weakness and light-headedness.  All other systems reviewed and are negative.     Allergies  Shellfish allergy; Xylocaine; Biaxin; Celebrex; Cozaar; Demerol; Elavil; Fosamax; Hydromorphone; Inderal; Ivp dye; Levaquin; Macrodantin; Mycinette; Neggram; Novocain;  Pamelor; Penicillins; Robaxin; Tamiflu; Tenormin; Triavil; Ampicillin; Paxil; Wellbutrin; and Zoloft  Home Medications   Prior to Admission medications   Medication Sig Start Date End Date Taking? Authorizing Provider  amLODipine (NORVASC) 5 MG tablet Take 5 mg by mouth every morning.     Historical Provider, MD  Cholecalciferol (VITAMIN D PO) Take 1 tablet by mouth every morning.     Historical Provider, MD  clopidogrel (PLAVIX) 75 MG tablet Take 1 tablet by mouth every morning.  10/28/12   Historical Provider, MD  Coenzyme Q10 (CO Q-10) 50 MG CAPS Take 1 capsule by mouth every morning. Pt states takes 30 mgs    Historical Provider, MD  cycloSPORINE (RESTASIS) 0.05 % ophthalmic emulsion Place 1 drop into both eyes 2 (two) times daily.    Historical Provider, MD  diazepam (VALIUM) 2 MG tablet Take 1-2 mg by mouth every 12 (twelve) hours as needed for anxiety.     Historical Provider, MD  escitalopram (LEXAPRO) 20 MG tablet Take 20 mg by mouth daily.  07/06/14   Historical Provider, MD  ezetimibe (ZETIA) 10 MG tablet Take 5 mg by mouth at bedtime.     Historical Provider, MD  OLANZapine (ZYPREXA) 10 MG tablet Take 10 mg by mouth daily. 07/07/14   Historical Provider, MD  pantoprazole (PROTONIX) 40 MG tablet Take 40 mg by mouth every morning.     Historical Provider, MD  Polyethyl Glycol-Propyl Glycol (SYSTANE FREE OP) Place 1 drop into both eyes daily as needed (dry eyes).     Historical Provider, MD  rosuvastatin (CRESTOR) 10 MG tablet Take 10 mg by mouth at bedtime.     Historical Provider, MD   BP 157/61 mmHg  Pulse 70  Temp(Src) 98.6 F (37 C) (Oral)  Resp 21  Ht 5\' 1"  (1.549 m)  Wt 61.009 kg  BMI 25.43 kg/m2  SpO2 93% Physical Exam  Constitutional: She is oriented to person, place, and time. She appears well-developed and well-nourished. No distress.  HENT:  Left head/forehead and eye trauma with periorbital swelling and ecchymosis. No nasal septal hematoma. No sign of basilar skull fx.  No oral trauma. Right eye blind (baseline). Left eye vision intact but eyelid swollen. Left eye w/entrapment: cannot look left.  Small lac to left forehead/periorbital area.   Cardiovascular: Normal rate, regular rhythm, normal heart sounds and intact distal pulses.  Exam reveals no gallop and no friction rub.   No murmur heard. Pulmonary/Chest: Effort normal and breath sounds normal. No respiratory distress. She has no wheezes. She has no rales. She exhibits no tenderness.  Abdominal: Soft. Bowel sounds are normal. She exhibits no distension and no mass. There is no tenderness. There is no rebound and no guarding.  Musculoskeletal: She exhibits edema and tenderness.  Left wrist deformity. NVI, compartments  soft.  Right wrist ttp over radius. NVI, compartments soft.   Lymphadenopathy:    She has no cervical adenopathy.  Neurological: She is alert and oriented to person, place, and time. No cranial nerve deficit. Coordination normal.  Skin: Skin is warm and dry. She is not diaphoretic.  Nursing note and vitals reviewed.   ED Course  Procedures (including critical care time) Labs Review Labs Reviewed  BASIC METABOLIC PANEL - Abnormal; Notable for the following:    Glucose, Bld 133 (*)    GFR calc non Af Amer 53 (*)    All other components within normal limits  CBC WITH DIFFERENTIAL/PLATELET - Abnormal; Notable for the following:    WBC 11.9 (*)    Neutro Abs 9.8 (*)    Monocytes Absolute 1.1 (*)    All other components within normal limits  TROPONIN I  URINALYSIS, ROUTINE W REFLEX MICROSCOPIC (NOT AT [facility])  CBG MONITORING, ED    Imaging Review Dg Chest 1 View  01/25/2015  CLINICAL DATA:  Status post fall from standing, with concern for chest injury. Initial encounter. EXAM: CHEST 1 VIEW COMPARISON:  Chest radiograph performed 01/29/2014 FINDINGS: The lungs are well-aerated and clear. There is no evidence of focal opacification, pleural effusion or pneumothorax. The  cardiomediastinal silhouette is borderline normal in size. No acute osseous abnormalities are seen. IMPRESSION: No acute cardiopulmonary process seen. No displaced rib fractures identified. Electronically Signed   By: Garald Balding M.D.   On: 01/25/2015 22:38   Dg Wrist Complete Left  01/25/2015  CLINICAL DATA:  Fall today.  Wrist pain, swelling and deformity. EXAM: LEFT WRIST - COMPLETE 3+ VIEW COMPARISON:  None. FINDINGS: There are acute fractures of the distal radius and ulna. The radial fracture demonstrates significant posterior displacement and apex anterior angulation. Intra-articular extension of this fracture is difficult to exclude. The fracture does extend into the distal radioulnar joint which is mildly widened. There is a fracture of the ulnar styloid. The carpal bones appear intact and still wall articulate with the distal radius on the lateral view. IMPRESSION: Impacted and posteriorly displaced fractures of the distal radius, most consistent with a Colles fracture. Involvement of the distal articular surface of the radius is difficult to completely exclude. Ulnar styloid fracture. Electronically Signed   By: Richardean Sale M.D.   On: 01/25/2015 22:38   Dg Wrist Complete Right  01/25/2015  CLINICAL DATA:  Status post fall from standing, with right wrist swelling. Initial encounter. EXAM: RIGHT WRIST - COMPLETE 3+ VIEW COMPARISON:  None. FINDINGS: There is a mildly comminuted fracture involving the distal radial metaphysis, with suggestion of extension to the radiocarpal joint. Minimal dorsal tilt is suggested. The carpal rows are intact, and demonstrate normal alignment. Soft tissue swelling is noted about the fracture sites. IMPRESSION: Mildly comminuted fracture involving the distal radial metaphysis, with suggestion of extension to the radiocarpal joint. Minimal dorsal tilt suggested, without significant displacement. Electronically Signed   By: Garald Balding M.D.   On: 01/25/2015 22:37    Ct Head Wo Contrast  01/25/2015  ADDENDUM REPORT: 01/25/2015 23:06 ADDENDUM: Please note there is a small left frontal hemorrhage likely combination of a small subdural and subarachnoid. Left frontal cortical contusion also noted. A 5 mm round focus over the right parietal convexity (series 201, image 22) may also represent focal cortical contusion. Left occipital scalp hematoma. These results were called by telephone at the time of interpretation on 01/25/2015 at 11:06 pm to Dr. Sherian Maroon , who  verbally acknowledged these results. Electronically Signed   By: Anner Crete M.D.   On: 01/25/2015 23:06  01/25/2015  CLINICAL DATA:  81 year old female with fall and left leg pain. EXAM: CT HEAD WITHOUT CONTRAST CT MAXILLOFACIAL WITHOUT CONTRAST CT CERVICAL SPINE WITHOUT CONTRAST TECHNIQUE: Multidetector CT imaging of the head, cervical spine, and maxillofacial structures were performed using the standard protocol without intravenous contrast. Multiplanar CT image reconstructions of the cervical spine and maxillofacial structures were also generated. COMPARISON:  Head CT dated 04/14/2014 FINDINGS: CT HEAD FINDINGS The ventricles are dilated and the sulci are prominent compatible with age-related atrophy. Periventricular and deep white matter hypodensities represent chronic microvascular ischemic changes. There is no intracranial hemorrhage. No mass effect or midline shift identified. The visualized mastoid air cells are well aerated. There is opacification of the visualized left maxillary sinus and partial opacification of the left sphenoid sinus. Left periorbital hematoma. The calvarium is intact. CT MAXILLOFACIAL FINDINGS The mandible is intact. There is a nondisplaced fracture of the left zygomatic arch. There are fractures of the posterior lateral wall of the left maxillary sinus. There is fracture of the left orbital floor with fracture of the left inferior orbital rim. There is apparent abutment of the  left inferior rectus muscle to the orbital floor fracture. Clinical correlation is recommended to evaluate for ocular muscle entrapment. There is a dysconjugate gaze. There is slight ovoid appearance of the left globe. Correlation with ophthalmological exam recommended. There is stranding of the left retro-orbital fat . Linear high density noted along the left orbital floor as well as along the roof of the left orbit compatible with hematoma. The lamina papyracea is intact. The right orbit, right retro-orbital fat, and right orbital walls are intact. The nasal bone and nasal septum is intact. The pterygoid plates appear unremarkable. There is left periorbital hematoma. There is near complete opacification of the left maxillary sinus with partial opacification of the left sphenoid sinus. Remainder of the visualized paranasal sinuses and mastoid air cells are well aerated. CT CERVICAL SPINE FINDINGS There is no acute fracture or subluxation of the cervical spine.There is osteopenia with multilevel degenerative changes of the spine.The odontoid and spinous processes are intact.There is normal anatomic alignment of the C1-C2 lateral masses. The visualized soft tissues appear unremarkable. There is a 0.8 x 0.7 cm ground-glass nodular density in the right apical region (series 402 image 76). IMPRESSION: No acute intracranial hemorrhage. Age-related atrophy and chronic microvascular ischemic disease. No acute/traumatic cervical spine pathology. Left facial and orbital fractures as described. There is slight oval appearance of the left globe. Correlation with ophthalmological exam recommended. Electronically Signed: By: Anner Crete M.D. On: 01/25/2015 22:49   Ct Cervical Spine Wo Contrast  01/25/2015  ADDENDUM REPORT: 01/25/2015 23:06 ADDENDUM: Please note there is a small left frontal hemorrhage likely combination of a small subdural and subarachnoid. Left frontal cortical contusion also noted. A 5 mm round focus  over the right parietal convexity (series 201, image 22) may also represent focal cortical contusion. Left occipital scalp hematoma. These results were called by telephone at the time of interpretation on 01/25/2015 at 11:06 pm to Dr. Sherian Maroon , who verbally acknowledged these results. Electronically Signed   By: Anner Crete M.D.   On: 01/25/2015 23:06  01/25/2015  CLINICAL DATA:  81 year old female with fall and left leg pain. EXAM: CT HEAD WITHOUT CONTRAST CT MAXILLOFACIAL WITHOUT CONTRAST CT CERVICAL SPINE WITHOUT CONTRAST TECHNIQUE: Multidetector CT imaging of the head, cervical spine, and  maxillofacial structures were performed using the standard protocol without intravenous contrast. Multiplanar CT image reconstructions of the cervical spine and maxillofacial structures were also generated. COMPARISON:  Head CT dated 04/14/2014 FINDINGS: CT HEAD FINDINGS The ventricles are dilated and the sulci are prominent compatible with age-related atrophy. Periventricular and deep white matter hypodensities represent chronic microvascular ischemic changes. There is no intracranial hemorrhage. No mass effect or midline shift identified. The visualized mastoid air cells are well aerated. There is opacification of the visualized left maxillary sinus and partial opacification of the left sphenoid sinus. Left periorbital hematoma. The calvarium is intact. CT MAXILLOFACIAL FINDINGS The mandible is intact. There is a nondisplaced fracture of the left zygomatic arch. There are fractures of the posterior lateral wall of the left maxillary sinus. There is fracture of the left orbital floor with fracture of the left inferior orbital rim. There is apparent abutment of the left inferior rectus muscle to the orbital floor fracture. Clinical correlation is recommended to evaluate for ocular muscle entrapment. There is a dysconjugate gaze. There is slight ovoid appearance of the left globe. Correlation with ophthalmological exam  recommended. There is stranding of the left retro-orbital fat . Linear high density noted along the left orbital floor as well as along the roof of the left orbit compatible with hematoma. The lamina papyracea is intact. The right orbit, right retro-orbital fat, and right orbital walls are intact. The nasal bone and nasal septum is intact. The pterygoid plates appear unremarkable. There is left periorbital hematoma. There is near complete opacification of the left maxillary sinus with partial opacification of the left sphenoid sinus. Remainder of the visualized paranasal sinuses and mastoid air cells are well aerated. CT CERVICAL SPINE FINDINGS There is no acute fracture or subluxation of the cervical spine.There is osteopenia with multilevel degenerative changes of the spine.The odontoid and spinous processes are intact.There is normal anatomic alignment of the C1-C2 lateral masses. The visualized soft tissues appear unremarkable. There is a 0.8 x 0.7 cm ground-glass nodular density in the right apical region (series 402 image 76). IMPRESSION: No acute intracranial hemorrhage. Age-related atrophy and chronic microvascular ischemic disease. No acute/traumatic cervical spine pathology. Left facial and orbital fractures as described. There is slight oval appearance of the left globe. Correlation with ophthalmological exam recommended. Electronically Signed: By: Anner Crete M.D. On: 01/25/2015 22:49   Ct Maxillofacial Wo Cm  01/25/2015  ADDENDUM REPORT: 01/25/2015 23:06 ADDENDUM: Please note there is a small left frontal hemorrhage likely combination of a small subdural and subarachnoid. Left frontal cortical contusion also noted. A 5 mm round focus over the right parietal convexity (series 201, image 22) may also represent focal cortical contusion. Left occipital scalp hematoma. These results were called by telephone at the time of interpretation on 01/25/2015 at 11:06 pm to Dr. Sherian Maroon , who verbally  acknowledged these results. Electronically Signed   By: Anner Crete M.D.   On: 01/25/2015 23:06  01/25/2015  CLINICAL DATA:  81 year old female with fall and left leg pain. EXAM: CT HEAD WITHOUT CONTRAST CT MAXILLOFACIAL WITHOUT CONTRAST CT CERVICAL SPINE WITHOUT CONTRAST TECHNIQUE: Multidetector CT imaging of the head, cervical spine, and maxillofacial structures were performed using the standard protocol without intravenous contrast. Multiplanar CT image reconstructions of the cervical spine and maxillofacial structures were also generated. COMPARISON:  Head CT dated 04/14/2014 FINDINGS: CT HEAD FINDINGS The ventricles are dilated and the sulci are prominent compatible with age-related atrophy. Periventricular and deep white matter hypodensities represent chronic microvascular  ischemic changes. There is no intracranial hemorrhage. No mass effect or midline shift identified. The visualized mastoid air cells are well aerated. There is opacification of the visualized left maxillary sinus and partial opacification of the left sphenoid sinus. Left periorbital hematoma. The calvarium is intact. CT MAXILLOFACIAL FINDINGS The mandible is intact. There is a nondisplaced fracture of the left zygomatic arch. There are fractures of the posterior lateral wall of the left maxillary sinus. There is fracture of the left orbital floor with fracture of the left inferior orbital rim. There is apparent abutment of the left inferior rectus muscle to the orbital floor fracture. Clinical correlation is recommended to evaluate for ocular muscle entrapment. There is a dysconjugate gaze. There is slight ovoid appearance of the left globe. Correlation with ophthalmological exam recommended. There is stranding of the left retro-orbital fat . Linear high density noted along the left orbital floor as well as along the roof of the left orbit compatible with hematoma. The lamina papyracea is intact. The right orbit, right retro-orbital  fat, and right orbital walls are intact. The nasal bone and nasal septum is intact. The pterygoid plates appear unremarkable. There is left periorbital hematoma. There is near complete opacification of the left maxillary sinus with partial opacification of the left sphenoid sinus. Remainder of the visualized paranasal sinuses and mastoid air cells are well aerated. CT CERVICAL SPINE FINDINGS There is no acute fracture or subluxation of the cervical spine.There is osteopenia with multilevel degenerative changes of the spine.The odontoid and spinous processes are intact.There is normal anatomic alignment of the C1-C2 lateral masses. The visualized soft tissues appear unremarkable. There is a 0.8 x 0.7 cm ground-glass nodular density in the right apical region (series 402 image 76). IMPRESSION: No acute intracranial hemorrhage. Age-related atrophy and chronic microvascular ischemic disease. No acute/traumatic cervical spine pathology. Left facial and orbital fractures as described. There is slight oval appearance of the left globe. Correlation with ophthalmological exam recommended. Electronically Signed: By: Anner Crete M.D. On: 01/25/2015 22:49   I have personally reviewed and evaluated these images and lab results as part of my medical decision-making.   EKG Interpretation   Date/Time:  Friday January 25 2015 21:43:29 EST Ventricular Rate:  61 PR Interval:  172 QRS Duration: 87 QT Interval:  403 QTC Calculation: 406 R Axis:   61 Text Interpretation:  Sinus rhythm Confirmed by Hazle Coca [phone]) on  01/25/2015 11:08:29 PM      MDM   Final diagnoses:  Wrist fracture, bilateral, closed, initial encounter  SDH (subdural hematoma) (HCC)  SAH (subarachnoid hemorrhage) (HCC)  Face lacerations, initial encounter  Syncope and collapse  Fracture of left orbital floor ([place])   Fall from standing with head trauma. Arrived in NAD, AFVSS aside from HTN. Pt doesn't remember the fall. Has HA now.  Left eyelid swollen shut. Cannot move left eye laterally, concerned for entrapment. Pt has obvious deformity to left wrist. Dopplered pulses in radial and ulnar arteries. Compartments soft. NVI though ROM limited 2/2 pain. Pt in [place]. Stat CT head, face, xr of bilateral wrists.   Tylenol and fentanyl given for HA.   Tonopin reveals right eye with 66mmHg and left eye with 81mmHg. However, vision remains intact in left eye despite trauma. Not c/w retrobulbar hematoma. Likely increased pressures secondary to tremendous amount of superficial swelling and my attempts at holding the eye open.   CT Head shows left frontal subdural/SAH: consulted NSU. CT face shows left zygomatic arch fracture and left  orbital frx: consulted ophtho and face trauma. Per face trauma: plan to start decadron TID for 3 days. If swelling subsides and entrapment still exists will need surgical repair later in the week w/Dr. Buelah Manis. Cspine negative for injury. Cleared. Ccollar removed.  XR wrists: bilateral wrist fractures. NVI, compartments soft. Hand trauma consulted, will see on inpt. Given left wrist fx needs reduction, planned on doing hematoma block as do not feel good about sedation with head trauma. However, pt is allergic to several medications including lidocaines. They seem to be real allergies with prior lidocaine product causing SOB and syncope. Thereforee, wrists placed in bilateral splints.   Small lac repaired by Dr. Ralene Bathe.   Admit to trauma surgery.   Pt was seen under the supervision of Dr. Ralene Bathe.     Sherian Maroon, MD 01/26/15 [id number]  Quintella Reichert, MD 01/26/15 [phone]

## 2015-01-25 NOTE — ED Notes (Signed)
This RN transported the pt to CT.

## 2015-01-25 NOTE — ED Notes (Signed)
Per EMS, pt fell today. Pt fell from standing, and does not remember falling. Pt with laceration to left eye, and deformity to left wrist, and swelling to right wrist. Pt received 4mg  morphine en route.

## 2015-01-26 ENCOUNTER — Encounter: Payer: Self-pay | Admitting: Ophthalmology

## 2015-01-26 ENCOUNTER — Inpatient Hospital Stay (HOSPITAL_COMMUNITY): Payer: Medicare Other

## 2015-01-26 LAB — CBC
HEMATOCRIT: 40.8 % (ref 36.0–46.0)
Hemoglobin: 13.2 g/dL (ref 12.0–15.0)
MCH: 29.7 pg (ref 26.0–34.0)
MCHC: 32.4 g/dL (ref 30.0–36.0)
MCV: 91.7 fL (ref 78.0–100.0)
Platelets: 169 10*3/uL (ref 150–400)
RBC: 4.45 MIL/uL (ref 3.87–5.11)
RDW: 13.7 % (ref 11.5–15.5)
WBC: 9.3 10*3/uL (ref 4.0–10.5)

## 2015-01-26 LAB — URINE MICROSCOPIC-ADD ON: RBC / HPF: NONE SEEN RBC/hpf (ref 0–5)

## 2015-01-26 LAB — URINALYSIS, ROUTINE W REFLEX MICROSCOPIC
BILIRUBIN URINE: NEGATIVE
Glucose, UA: NEGATIVE mg/dL
HGB URINE DIPSTICK: NEGATIVE
KETONES UR: 15 mg/dL — AB
Nitrite: NEGATIVE
Protein, ur: 30 mg/dL — AB
SPECIFIC GRAVITY, URINE: 1.016 (ref 1.005–1.030)
pH: 7 (ref 5.0–8.0)

## 2015-01-26 LAB — BASIC METABOLIC PANEL
ANION GAP: 8 (ref 5–15)
BUN: 13 mg/dL (ref 6–20)
CALCIUM: 9.2 mg/dL (ref 8.9–10.3)
CO2: 29 mmol/L (ref 22–32)
Chloride: 108 mmol/L (ref 101–111)
Creatinine, Ser: 0.96 mg/dL (ref 0.44–1.00)
GFR, EST NON AFRICAN AMERICAN: 52 mL/min — AB (ref >60)
GLUCOSE: 132 mg/dL — AB (ref 65–99)
POTASSIUM: 4 mmol/L (ref 3.5–5.1)
Sodium: 145 mmol/L (ref 135–145)

## 2015-01-26 LAB — MRSA PCR SCREENING: MRSA by PCR: NEGATIVE

## 2015-01-26 LAB — PROTIME-INR
INR: 1.17 (ref 0.00–1.49)
Prothrombin Time: 15.1 seconds (ref 11.6–15.2)

## 2015-01-26 LAB — GLUCOSE, CAPILLARY: Glucose-Capillary: 141 mg/dL — ABNORMAL HIGH (ref 65–99)

## 2015-01-26 MED ORDER — AMLODIPINE BESYLATE 5 MG PO TABS
5.0000 mg | ORAL_TABLET | Freq: Every morning | ORAL | Status: DC
  Administered 2015-01-26 – 2015-01-29 (×4): 5 mg via ORAL
  Filled 2015-01-26 (×4): qty 1

## 2015-01-26 MED ORDER — ACETAMINOPHEN 10 MG/ML IV SOLN
1000.0000 mg | Freq: Four times a day (QID) | INTRAVENOUS | Status: AC | PRN
  Administered 2015-01-26 – 2015-01-27 (×2): 1000 mg via INTRAVENOUS
  Filled 2015-01-26 (×2): qty 100

## 2015-01-26 MED ORDER — DEXAMETHASONE SODIUM PHOSPHATE 10 MG/ML IJ SOLN
8.0000 mg | Freq: Three times a day (TID) | INTRAMUSCULAR | Status: AC
  Administered 2015-01-26 – 2015-01-28 (×8): 8 mg via INTRAVENOUS
  Filled 2015-01-26 (×8): qty 1

## 2015-01-26 MED ORDER — HYDRALAZINE HCL 20 MG/ML IJ SOLN: 5.0000 mg | Freq: Four times a day (QID) | INTRAMUSCULAR | Status: DC | PRN

## 2015-01-26 MED ORDER — ESCITALOPRAM OXALATE 10 MG PO TABS
20.0000 mg | ORAL_TABLET | Freq: Every day | ORAL | Status: DC
Start: 2015-01-26 — End: 2015-01-30
  Administered 2015-01-26 – 2015-01-29 (×4): 20 mg via ORAL
  Filled 2015-01-26 (×4): qty 2

## 2015-01-26 MED ORDER — ONDANSETRON HCL 4 MG/2ML IJ SOLN
INTRAMUSCULAR | Status: AC
  Administered 2015-01-26: 4 mg
  Filled 2015-01-26: qty 2

## 2015-01-26 MED ORDER — DEXAMETHASONE SODIUM PHOSPHATE 10 MG/ML IJ SOLN: 10.0000 mg | Freq: Three times a day (TID) | INTRAMUSCULAR | Status: DC

## 2015-01-26 MED ORDER — CHLORHEXIDINE GLUCONATE 0.12 % MT SOLN
15.0000 mL | Freq: Two times a day (BID) | OROMUCOSAL | Status: DC
  Administered 2015-01-26 – 2015-01-29 (×9): 15 mL via OROMUCOSAL
  Filled 2015-01-26 (×8): qty 15

## 2015-01-26 MED ORDER — ONDANSETRON HCL 4 MG/2ML IJ SOLN: 4.0000 mg | Freq: Four times a day (QID) | INTRAMUSCULAR | Status: DC | PRN

## 2015-01-26 MED ORDER — PANTOPRAZOLE SODIUM 40 MG PO TBEC
40.0000 mg | DELAYED_RELEASE_TABLET | Freq: Every morning | ORAL | Status: DC
  Administered 2015-01-26 – 2015-01-29 (×4): 40 mg via ORAL
  Filled 2015-01-26 (×4): qty 1

## 2015-01-26 MED ORDER — OLANZAPINE 10 MG PO TABS
10.0000 mg | ORAL_TABLET | Freq: Every day | ORAL | Status: DC
  Administered 2015-01-26 – 2015-01-29 (×4): 10 mg via ORAL
  Filled 2015-01-26 (×5): qty 1

## 2015-01-26 MED ORDER — CETYLPYRIDINIUM CHLORIDE 0.05 % MT LIQD
7.0000 mL | Freq: Two times a day (BID) | OROMUCOSAL | Status: DC
  Administered 2015-01-26 – 2015-01-29 (×8): 7 mL via OROMUCOSAL

## 2015-01-26 MED ORDER — EZETIMIBE 10 MG PO TABS
5.0000 mg | ORAL_TABLET | Freq: Every day | ORAL | Status: DC
  Administered 2015-01-26 – 2015-01-29 (×4): 5 mg via ORAL
  Filled 2015-01-26: qty 0.5
  Filled 2015-01-26 (×3): qty 1
  Filled 2015-01-26: qty 0.5
  Filled 2015-01-26: qty 1

## 2015-01-26 MED ORDER — POTASSIUM CHLORIDE IN NACL 20-0.9 MEQ/L-% IV SOLN
INTRAVENOUS | Status: DC
  Administered 2015-01-26: 17:00:00 via INTRAVENOUS
  Administered 2015-01-26: 1000 mL via INTRAVENOUS
  Administered 2015-01-27: 20:00:00 via INTRAVENOUS
  Filled 2015-01-26 (×6): qty 1000

## 2015-01-26 MED ORDER — ONDANSETRON HCL 4 MG PO TABS: 4.0000 mg | ORAL_TABLET | Freq: Four times a day (QID) | ORAL | Status: DC | PRN

## 2015-01-26 MED ORDER — FENTANYL CITRATE (PF) 100 MCG/2ML IJ SOLN
50.0000 ug | INTRAMUSCULAR | Status: DC | PRN
  Administered 2015-01-26: 50 ug via INTRAVENOUS
  Filled 2015-01-26: qty 2

## 2015-01-26 NOTE — Progress Notes (Unsigned)
History   Allison [name] is an 81 y.o. female.  Chief Complaint:  Chief Complaint  Patient presents with  . Fall  . Wrist Pain  . Loss of Consciousness    Fall Pertinent negatives include no abdominal pain, chest pain, coughing, headaches, myalgias, nausea, neck pain or vomiting.  Wrist Pain Pertinent negatives include no abdominal pain, chest pain, coughing, headaches, myalgias, nausea, neck pain or vomiting.  Loss of Consciousness Associated symptoms: no chest pain, no dizziness, no focal weakness, no headaches, no nausea, no shortness of breath and no vomiting  This is an 67-yo female with multiple medical issues who presents after a fall in the bathroom at home. Questionable LOC. Patient is amnestic for the exact circumstances of the fall. She has swelling and laceration over/ around the left orbit, bilateral wrist deformities. She was evaluated by the EDP and has multiple injuries. We are asked to admit the patient due to multi-system trauma.  She reports no chest pain, shortness of breath, or abdominal pain.   She is anticoagulated on Plavix for recurrent TIA's. She lives at home with her elderly husband. Her son is a Engineer, drilling.  Past Medical History  Diagnosis Date  . HTN (hypertension)   . Aortic aneurysm ([place])   . Transient ischemic attack   . Murmur, cardiac   . Cholelithiasis   . Internal hemorrhoids   . Irritable bowel syndrome   . Depression   . Eustachian tube dysfunction     pt denies  . Appendicitis   . CVA (cerebral infarction)     Right retinal branch occlusion  . Carotid artery plaque     hx of per left side   . Stroke ([place])   . Legally blind in right eye, as defined in [place]     related to past CVA  . Anxiety   . Chronic kidney disease     hx of kidney stones  . GERD (gastroesophageal reflux disease)   . Allergy   . Arthritis   . Cataract   .  Clotting disorder ([place])   . Osteoporosis   Hx of rvo od  Past Surgical History  Procedure Laterality Date  . Abdominal aortic aneurysm repair  2005  . Kidney stones removed    . Appendectomy    . Abdominal hysterectomy    . Cholecystectomy    . Shoulder surgery      Left  . Wrist surgery      Right  . Tee without cardioversion  12/18/2011    Procedure: TRANSESOPHAGEAL ECHOCARDIOGRAM (TEE); Surgeon: Larey Dresser, MD; Location: [facility]; Service: Cardiovascular; Laterality: N/A;  . Eye surgery      hx of lazy eye     Family History  Problem Relation Age of Onset  . Asthma Brother     died age 61  . Diabetes Mother   . Coronary artery disease Mother     died age 48  . Coronary artery disease Father     died age 73  . Coronary artery disease Brother     died age 55  . Coronary artery disease Sister     died age 30   Social History:  reports that she quit smoking about 9 months ago. Her smoking use included Cigarettes. She has a 10 pack-year smoking history. She has never used smokeless tobacco. She reports that she does not drink alcohol or use illicit drugs.  Allergies   Allergies  Allergen Reactions  . Shellfish Allergy Shortness  Of Breath and Rash    All seafood  . Xylocaine [Lidocaine] Shortness Of Breath  . Biaxin [Clarithromycin]     unknown  . Celebrex [Celecoxib]     Unknown   . Cozaar [Losartan]     High blood pressure  . Demerol [Meperidine]     unknown  . Elavil [Amitriptyline]     unknown  . Fosamax [Alendronate]     unknown  . Hydromorphone     Hives   . Inderal [Propranolol]     unknown  . Ivp Dye [Iodinated Diagnostic Agents]     rash  . Levaquin [Levofloxacin]     unknown  . Macrodantin [Nitrofurantoin]     unknown  . Mycinette [Phenol]      Unknown   . Neggram [Nalidixic Acid]     unknown  . Novocain [Procaine]     unknown  . Pamelor [Nortriptyline]     unknown  . Penicillins     rash  . Robaxin [Methocarbamol]     unknown  . Tamiflu [Oseltamivir]     Unknown   . Tenormin [Atenolol]     unknown  . Triavil [Perphenazine-Amitriptyline]     Unknown   . Ampicillin Rash  . Paxil [Paroxetine] Rash  . Wellbutrin [Bupropion] Rash  . Zoloft [Sertraline] Rash    Home Medications   Prior to Admission medications   Medication Sig Start Date End Date Taking? Authorizing Provider  amLODipine (NORVASC) 5 MG tablet Take 5 mg by mouth every morning.     Historical Provider, MD  Cholecalciferol (VITAMIN D PO) Take 1 tablet by mouth every morning.     Historical Provider, MD  clopidogrel (PLAVIX) 75 MG tablet Take 1 tablet by mouth every morning.  10/28/12   Historical Provider, MD  Coenzyme Q10 (CO Q-10) 50 MG CAPS Take 1 capsule by mouth every morning. Pt states takes 30 mgs    Historical Provider, MD  cycloSPORINE (RESTASIS) 0.05 % ophthalmic emulsion Place 1 drop into both eyes 2 (two) times daily.    Historical Provider, MD  diazepam (VALIUM) 2 MG tablet Take 1-2 mg by mouth every 12 (twelve) hours as needed for anxiety.     Historical Provider, MD  escitalopram (LEXAPRO) 20 MG tablet Take 20 mg by mouth daily.  07/06/14   Historical Provider, MD  ezetimibe (ZETIA) 10 MG tablet Take 5 mg by mouth at bedtime.     Historical Provider, MD  OLANZapine (ZYPREXA) 10 MG tablet Take 10 mg by mouth daily. 07/07/14   Historical Provider, MD  pantoprazole (PROTONIX) 40 MG tablet Take 40 mg by mouth every morning.     Historical Provider, MD  Polyethyl Glycol-Propyl Glycol (SYSTANE FREE OP) Place 1 drop into both eyes daily as needed (dry eyes).     Historical Provider, MD  rosuvastatin (CRESTOR) 10 MG  tablet Take 10 mg by mouth at bedtime.     Historical Provider, MD        Exam:  [facility] NLP, 20/200 (near va without readers) Pupil: apd od EOM: full od, -1 abduction, supraduction, adduction, full infraduction CVF: unable od, full grossly Pupil 18,33 tonopen  L/l: wnl od, hematoma lul, sp lac repair Conj: w+q, sch temp and nasally os K: clear ou Ac:d+q ou Lens: +ns ou Iris: wnl ou Dfe: Vit clear ou Optic nerve 0.4 pale od, s+p os Mac flat ou Vessel wnl ou Periphery flat x 4 ou  CT maxillofacial: reviewed. +orbital florr fx with possible inf rectus  entrapment, retrobulbar stranding, globe intact  A/p:  1. Orbital fx os: cannot rule out entrapment. Further eval and tx with orbital surgeon for possible repair. Antibiotic for prophylaxis.  Prognosis-wise, pt to unlikely suffer from diplopia given monocular status.  2. subconj hemorrhage os; expect self resolution. 3. Eyelid lac and hematoma os: sp repair. Expect self resolution of hematoma. Ice pack to left eyelid 4. Blind od: hx of rvo od. Monocular precautions reviewed. 5. Cataracts ou: not visually sig. Observe. 6. Ocular hypertension os; suspect artifact given eyelid hematoma. Recheck after hematoma resolves.  Fu after discharge with pt's ophthalmologist, Dr. Gershon Crane, within a week

## 2015-01-26 NOTE — Progress Notes (Signed)
Informed Dr. Grandville Silos of patient neurological changed.  Was informed to notify Dr. Ronnald Ramp with Neurosurgery, who informed me to contact MD on call.  Dr. Sherwood Gambler ordered stat CT.  Will continue to monitor patient.

## 2015-01-26 NOTE — Consult Note (Signed)
Reason for Consult:bilateral distal radius fractures Referring Physician: Walls  Allison [name] is an 81 y.o. female.  HPI: s/p fall with bilateral distal radius fractures left grearter than right  Past Medical History  Diagnosis Date  . HTN (hypertension)   . Aortic aneurysm ([place])   . Transient ischemic attack   . Murmur, cardiac   . Cholelithiasis   . Internal hemorrhoids   . Irritable bowel syndrome   . Depression   . Eustachian tube dysfunction     pt denies  . Appendicitis   . CVA (cerebral infarction)     Right retinal branch occlusion  . Carotid artery plaque     hx of per left side   . Stroke ([place])   . Legally blind in right eye, as defined in [place]     related to past CVA  . Anxiety   . Chronic kidney disease     hx of kidney stones  . GERD (gastroesophageal reflux disease)   . Allergy   . Arthritis   . Cataract   . Clotting disorder ([place])   . Osteoporosis     Past Surgical History  Procedure Laterality Date  . Abdominal aortic aneurysm repair  2005  . Kidney stones removed    . Appendectomy    . Abdominal hysterectomy    . Cholecystectomy    . Shoulder surgery      Left  . Wrist surgery      Right  . Tee without cardioversion  12/18/2011    Procedure: TRANSESOPHAGEAL ECHOCARDIOGRAM (TEE);  Surgeon: Larey Dresser, MD;  Location: [facility];  Service: Cardiovascular;  Laterality: N/A;  . Eye surgery      hx of lazy eye     Family History  Problem Relation Age of Onset  . Asthma Brother     died age 1  . Diabetes Mother   . Coronary artery disease Mother     died age 83  . Coronary artery disease Father     died age 36  . Coronary artery disease Brother     died age 69  . Coronary artery disease Sister     died age 24    Social History:  reports that she quit smoking about 9 months ago. Her smoking use included Cigarettes. She has a 10 pack-year smoking history. She has never used smokeless tobacco. She reports that she does not drink  alcohol or use illicit drugs.  Allergies:  Allergies  Allergen Reactions  . Shellfish Allergy Shortness Of Breath and Rash    All seafood  . Xylocaine [Lidocaine] Shortness Of Breath  . Biaxin [Clarithromycin]     unknown  . Celebrex [Celecoxib]     Unknown   . Cozaar [Losartan]     High blood pressure  . Demerol [Meperidine]     unknown  . Elavil [Amitriptyline]     unknown  . Fosamax [Alendronate]     unknown  . Hydromorphone     Hives   . Inderal [Propranolol]     unknown  . Ivp Dye [Iodinated Diagnostic Agents]     rash  . Levaquin [Levofloxacin]     unknown  . Macrodantin [Nitrofurantoin]     unknown  . Mycinette [Phenol]     Unknown   . Neggram [Nalidixic Acid]     unknown  . Novocain [Procaine]     unknown  . Pamelor [Nortriptyline]     unknown  . Penicillins  rash  . Robaxin [Methocarbamol]     unknown  . Tamiflu [Oseltamivir]     Unknown   . Tenormin [Atenolol]     unknown  . Triavil [Perphenazine-Amitriptyline]     Unknown   . Ampicillin Rash  . Paxil [Paroxetine] Rash  . Wellbutrin [Bupropion] Rash  . Zoloft [Sertraline] Rash    Medications:  Scheduled: . amLODipine  5 mg Oral q morning - 10a  . antiseptic oral rinse  7 mL Mouth Rinse q12n4p  . chlorhexidine  15 mL Mouth Rinse BID  . dexamethasone  8 mg Intravenous TID  . escitalopram  20 mg Oral Daily  . ezetimibe  5 mg Oral QHS  . OLANZapine  10 mg Oral Daily  . pantoprazole  40 mg Oral q morning - 10a    Results for orders placed or performed during the hospital encounter of 01/25/15 (from the past 48 hour(s))  Basic metabolic panel     Status: Abnormal   Collection Time: 01/25/15 11:04 PM  Result Value Ref Range   Sodium 143 135 - 145 mmol/L   Potassium 3.8 3.5 - 5.1 mmol/L   Chloride 107 101 - 111 mmol/L   CO2 27 22 - 32 mmol/L   Glucose, Bld 133 (H) 65 - 99 mg/dL   BUN 15 6 - 20 mg/dL   Creatinine, Ser 0.95 0.44 - 1.00 mg/dL   Calcium 9.5 8.9 - 10.3 mg/dL   GFR calc  non Af Amer 53 (L) >60 mL/min   GFR calc Af Amer >60 >60 mL/min    Comment: (NOTE) The eGFR has been calculated using the CKD EPI equation. This calculation has not been validated in all clinical situations. eGFR's persistently <60 mL/min signify possible Chronic Kidney Disease.    Anion gap 9 5 - 15  CBC with Differential     Status: Abnormal   Collection Time: 01/25/15 11:04 PM  Result Value Ref Range   WBC 11.9 (H) 4.0 - 10.5 K/uL   RBC 4.42 3.87 - 5.11 MIL/uL   Hemoglobin 13.1 12.0 - 15.0 g/dL   HCT 40.1 36.0 - 46.0 %   MCV 90.7 78.0 - 100.0 fL   MCH 29.6 26.0 - 34.0 pg   MCHC 32.7 30.0 - 36.0 g/dL   RDW 13.8 11.5 - 15.5 %   Platelets 169 150 - 400 K/uL   Neutrophils Relative % 83 %   Neutro Abs 9.8 (H) 1.7 - 7.7 K/uL   Lymphocytes Relative 7 %   Lymphs Abs 0.9 0.7 - 4.0 K/uL   Monocytes Relative 9 %   Monocytes Absolute 1.1 (H) 0.1 - 1.0 K/uL   Eosinophils Relative 1 %   Eosinophils Absolute 0.1 0.0 - 0.7 K/uL   Basophils Relative 0 %   Basophils Absolute 0.0 0.0 - 0.1 K/uL  Troponin I     Status: None   Collection Time: 01/25/15 11:16 PM  Result Value Ref Range   Troponin I <0.03 <0.031 ng/mL    Comment:        NO INDICATION OF MYOCARDIAL INJURY.   MRSA PCR Screening     Status: None   Collection Time: 01/26/15  2:02 AM  Result Value Ref Range   MRSA by PCR NEGATIVE NEGATIVE    Comment:        The GeneXpert MRSA Assay (FDA approved for NASAL specimens only), is one component of a comprehensive MRSA colonization surveillance program. It is not intended to diagnose MRSA infection nor to guide  or monitor treatment for MRSA infections.   Urinalysis, Routine w reflex microscopic (not at [facility])     Status: Abnormal   Collection Time: 01/26/15  4:39 AM  Result Value Ref Range   Color, Urine YELLOW YELLOW   APPearance CLOUDY (A) CLEAR   Specific Gravity, Urine 1.016 1.005 - 1.030   pH 7.0 5.0 - 8.0   Glucose, UA NEGATIVE NEGATIVE mg/dL   Hgb urine dipstick  NEGATIVE NEGATIVE   Bilirubin Urine NEGATIVE NEGATIVE   Ketones, ur 15 (A) NEGATIVE mg/dL   Protein, ur 30 (A) NEGATIVE mg/dL   Nitrite NEGATIVE NEGATIVE   Leukocytes, UA SMALL (A) NEGATIVE  Urine microscopic-add on     Status: Abnormal   Collection Time: 01/26/15  4:39 AM  Result Value Ref Range   Squamous Epithelial / LPF 0-5 (A) NONE SEEN   WBC, UA 0-5 0 - 5 WBC/hpf   RBC / HPF NONE SEEN 0 - 5 RBC/hpf   Bacteria, UA FEW (A) NONE SEEN   Casts HYALINE CASTS (A) NEGATIVE  CBC     Status: None   Collection Time: 01/26/15  6:16 AM  Result Value Ref Range   WBC 9.3 4.0 - 10.5 K/uL   RBC 4.45 3.87 - 5.11 MIL/uL   Hemoglobin 13.2 12.0 - 15.0 g/dL   HCT 40.8 36.0 - 46.0 %   MCV 91.7 78.0 - 100.0 fL   MCH 29.7 26.0 - 34.0 pg   MCHC 32.4 30.0 - 36.0 g/dL   RDW 13.7 11.5 - 15.5 %   Platelets 169 150 - 400 K/uL  Protime-INR     Status: None   Collection Time: 01/26/15  6:16 AM  Result Value Ref Range   Prothrombin Time 15.1 11.6 - 15.2 seconds   INR 1.17 0.00 - 4.65  Basic metabolic panel     Status: Abnormal   Collection Time: 01/26/15  6:16 AM  Result Value Ref Range   Sodium 145 135 - 145 mmol/L   Potassium 4.0 3.5 - 5.1 mmol/L   Chloride 108 101 - 111 mmol/L   CO2 29 22 - 32 mmol/L   Glucose, Bld 132 (H) 65 - 99 mg/dL   BUN 13 6 - 20 mg/dL   Creatinine, Ser 0.96 0.44 - 1.00 mg/dL   Calcium 9.2 8.9 - 10.3 mg/dL   GFR calc non Af Amer 52 (L) >60 mL/min   GFR calc Af Amer >60 >60 mL/min    Comment: (NOTE) The eGFR has been calculated using the CKD EPI equation. This calculation has not been validated in all clinical situations. eGFR's persistently <60 mL/min signify possible Chronic Kidney Disease.    Anion gap 8 5 - 15    Dg Chest 1 View  01/25/2015  CLINICAL DATA:  Status post fall from standing, with concern for chest injury. Initial encounter. EXAM: CHEST 1 VIEW COMPARISON:  Chest radiograph performed 01/29/2014 FINDINGS: The lungs are well-aerated and clear. There  is no evidence of focal opacification, pleural effusion or pneumothorax. The cardiomediastinal silhouette is borderline normal in size. No acute osseous abnormalities are seen. IMPRESSION: No acute cardiopulmonary process seen. No displaced rib fractures identified. Electronically Signed   By: Garald Balding M.D.   On: 01/25/2015 22:38   Dg Wrist Complete Left  01/25/2015  CLINICAL DATA:  Fall today.  Wrist pain, swelling and deformity. EXAM: LEFT WRIST - COMPLETE 3+ VIEW COMPARISON:  None. FINDINGS: There are acute fractures of the distal radius and ulna. The radial fracture  demonstrates significant posterior displacement and apex anterior angulation. Intra-articular extension of this fracture is difficult to exclude. The fracture does extend into the distal radioulnar joint which is mildly widened. There is a fracture of the ulnar styloid. The carpal bones appear intact and still wall articulate with the distal radius on the lateral view. IMPRESSION: Impacted and posteriorly displaced fractures of the distal radius, most consistent with a Colles fracture. Involvement of the distal articular surface of the radius is difficult to completely exclude. Ulnar styloid fracture. Electronically Signed   By: Richardean Sale M.D.   On: 01/25/2015 22:38   Dg Wrist Complete Right  01/25/2015  CLINICAL DATA:  Status post fall from standing, with right wrist swelling. Initial encounter. EXAM: RIGHT WRIST - COMPLETE 3+ VIEW COMPARISON:  None. FINDINGS: There is a mildly comminuted fracture involving the distal radial metaphysis, with suggestion of extension to the radiocarpal joint. Minimal dorsal tilt is suggested. The carpal rows are intact, and demonstrate normal alignment. Soft tissue swelling is noted about the fracture sites. IMPRESSION: Mildly comminuted fracture involving the distal radial metaphysis, with suggestion of extension to the radiocarpal joint. Minimal dorsal tilt suggested, without significant  displacement. Electronically Signed   By: Garald Balding M.D.   On: 01/25/2015 22:37   Ct Head Wo Contrast  01/25/2015  ADDENDUM REPORT: 01/25/2015 23:06 ADDENDUM: Please note there is a small left frontal hemorrhage likely combination of a small subdural and subarachnoid. Left frontal cortical contusion also noted. A 5 mm round focus over the right parietal convexity (series 201, image 22) may also represent focal cortical contusion. Left occipital scalp hematoma. These results were called by telephone at the time of interpretation on 01/25/2015 at 11:06 pm to Dr. Sherian Maroon , who verbally acknowledged these results. Electronically Signed   By: Anner Crete M.D.   On: 01/25/2015 23:06  01/25/2015  CLINICAL DATA:  81 year old female with fall and left leg pain. EXAM: CT HEAD WITHOUT CONTRAST CT MAXILLOFACIAL WITHOUT CONTRAST CT CERVICAL SPINE WITHOUT CONTRAST TECHNIQUE: Multidetector CT imaging of the head, cervical spine, and maxillofacial structures were performed using the standard protocol without intravenous contrast. Multiplanar CT image reconstructions of the cervical spine and maxillofacial structures were also generated. COMPARISON:  Head CT dated 04/14/2014 FINDINGS: CT HEAD FINDINGS The ventricles are dilated and the sulci are prominent compatible with age-related atrophy. Periventricular and deep white matter hypodensities represent chronic microvascular ischemic changes. There is no intracranial hemorrhage. No mass effect or midline shift identified. The visualized mastoid air cells are well aerated. There is opacification of the visualized left maxillary sinus and partial opacification of the left sphenoid sinus. Left periorbital hematoma. The calvarium is intact. CT MAXILLOFACIAL FINDINGS The mandible is intact. There is a nondisplaced fracture of the left zygomatic arch. There are fractures of the posterior lateral wall of the left maxillary sinus. There is fracture of the left orbital  floor with fracture of the left inferior orbital rim. There is apparent abutment of the left inferior rectus muscle to the orbital floor fracture. Clinical correlation is recommended to evaluate for ocular muscle entrapment. There is a dysconjugate gaze. There is slight ovoid appearance of the left globe. Correlation with ophthalmological exam recommended. There is stranding of the left retro-orbital fat . Linear high density noted along the left orbital floor as well as along the roof of the left orbit compatible with hematoma. The lamina papyracea is intact. The right orbit, right retro-orbital fat, and right orbital walls are intact. The nasal  bone and nasal septum is intact. The pterygoid plates appear unremarkable. There is left periorbital hematoma. There is near complete opacification of the left maxillary sinus with partial opacification of the left sphenoid sinus. Remainder of the visualized paranasal sinuses and mastoid air cells are well aerated. CT CERVICAL SPINE FINDINGS There is no acute fracture or subluxation of the cervical spine.There is osteopenia with multilevel degenerative changes of the spine.The odontoid and spinous processes are intact.There is normal anatomic alignment of the C1-C2 lateral masses. The visualized soft tissues appear unremarkable. There is a 0.8 x 0.7 cm ground-glass nodular density in the right apical region (series 402 image 76). IMPRESSION: No acute intracranial hemorrhage. Age-related atrophy and chronic microvascular ischemic disease. No acute/traumatic cervical spine pathology. Left facial and orbital fractures as described. There is slight oval appearance of the left globe. Correlation with ophthalmological exam recommended. Electronically Signed: By: Anner Crete M.D. On: 01/25/2015 22:49   Ct Cervical Spine Wo Contrast  01/25/2015  ADDENDUM REPORT: 01/25/2015 23:06 ADDENDUM: Please note there is a small left frontal hemorrhage likely combination of a small  subdural and subarachnoid. Left frontal cortical contusion also noted. A 5 mm round focus over the right parietal convexity (series 201, image 22) may also represent focal cortical contusion. Left occipital scalp hematoma. These results were called by telephone at the time of interpretation on 01/25/2015 at 11:06 pm to Dr. Sherian Maroon , who verbally acknowledged these results. Electronically Signed   By: Anner Crete M.D.   On: 01/25/2015 23:06  01/25/2015  CLINICAL DATA:  81 year old female with fall and left leg pain. EXAM: CT HEAD WITHOUT CONTRAST CT MAXILLOFACIAL WITHOUT CONTRAST CT CERVICAL SPINE WITHOUT CONTRAST TECHNIQUE: Multidetector CT imaging of the head, cervical spine, and maxillofacial structures were performed using the standard protocol without intravenous contrast. Multiplanar CT image reconstructions of the cervical spine and maxillofacial structures were also generated. COMPARISON:  Head CT dated 04/14/2014 FINDINGS: CT HEAD FINDINGS The ventricles are dilated and the sulci are prominent compatible with age-related atrophy. Periventricular and deep white matter hypodensities represent chronic microvascular ischemic changes. There is no intracranial hemorrhage. No mass effect or midline shift identified. The visualized mastoid air cells are well aerated. There is opacification of the visualized left maxillary sinus and partial opacification of the left sphenoid sinus. Left periorbital hematoma. The calvarium is intact. CT MAXILLOFACIAL FINDINGS The mandible is intact. There is a nondisplaced fracture of the left zygomatic arch. There are fractures of the posterior lateral wall of the left maxillary sinus. There is fracture of the left orbital floor with fracture of the left inferior orbital rim. There is apparent abutment of the left inferior rectus muscle to the orbital floor fracture. Clinical correlation is recommended to evaluate for ocular muscle entrapment. There is a dysconjugate gaze.  There is slight ovoid appearance of the left globe. Correlation with ophthalmological exam recommended. There is stranding of the left retro-orbital fat . Linear high density noted along the left orbital floor as well as along the roof of the left orbit compatible with hematoma. The lamina papyracea is intact. The right orbit, right retro-orbital fat, and right orbital walls are intact. The nasal bone and nasal septum is intact. The pterygoid plates appear unremarkable. There is left periorbital hematoma. There is near complete opacification of the left maxillary sinus with partial opacification of the left sphenoid sinus. Remainder of the visualized paranasal sinuses and mastoid air cells are well aerated. CT CERVICAL SPINE FINDINGS There is no acute fracture  or subluxation of the cervical spine.There is osteopenia with multilevel degenerative changes of the spine.The odontoid and spinous processes are intact.There is normal anatomic alignment of the C1-C2 lateral masses. The visualized soft tissues appear unremarkable. There is a 0.8 x 0.7 cm ground-glass nodular density in the right apical region (series 402 image 76). IMPRESSION: No acute intracranial hemorrhage. Age-related atrophy and chronic microvascular ischemic disease. No acute/traumatic cervical spine pathology. Left facial and orbital fractures as described. There is slight oval appearance of the left globe. Correlation with ophthalmological exam recommended. Electronically Signed: By: Anner Crete M.D. On: 01/25/2015 22:49   Ct Maxillofacial Wo Cm  01/25/2015  ADDENDUM REPORT: 01/25/2015 23:06 ADDENDUM: Please note there is a small left frontal hemorrhage likely combination of a small subdural and subarachnoid. Left frontal cortical contusion also noted. A 5 mm round focus over the right parietal convexity (series 201, image 22) may also represent focal cortical contusion. Left occipital scalp hematoma. These results were called by telephone at  the time of interpretation on 01/25/2015 at 11:06 pm to Dr. Sherian Maroon , who verbally acknowledged these results. Electronically Signed   By: Anner Crete M.D.   On: 01/25/2015 23:06  01/25/2015  CLINICAL DATA:  81 year old female with fall and left leg pain. EXAM: CT HEAD WITHOUT CONTRAST CT MAXILLOFACIAL WITHOUT CONTRAST CT CERVICAL SPINE WITHOUT CONTRAST TECHNIQUE: Multidetector CT imaging of the head, cervical spine, and maxillofacial structures were performed using the standard protocol without intravenous contrast. Multiplanar CT image reconstructions of the cervical spine and maxillofacial structures were also generated. COMPARISON:  Head CT dated 04/14/2014 FINDINGS: CT HEAD FINDINGS The ventricles are dilated and the sulci are prominent compatible with age-related atrophy. Periventricular and deep white matter hypodensities represent chronic microvascular ischemic changes. There is no intracranial hemorrhage. No mass effect or midline shift identified. The visualized mastoid air cells are well aerated. There is opacification of the visualized left maxillary sinus and partial opacification of the left sphenoid sinus. Left periorbital hematoma. The calvarium is intact. CT MAXILLOFACIAL FINDINGS The mandible is intact. There is a nondisplaced fracture of the left zygomatic arch. There are fractures of the posterior lateral wall of the left maxillary sinus. There is fracture of the left orbital floor with fracture of the left inferior orbital rim. There is apparent abutment of the left inferior rectus muscle to the orbital floor fracture. Clinical correlation is recommended to evaluate for ocular muscle entrapment. There is a dysconjugate gaze. There is slight ovoid appearance of the left globe. Correlation with ophthalmological exam recommended. There is stranding of the left retro-orbital fat . Linear high density noted along the left orbital floor as well as along the roof of the left orbit compatible  with hematoma. The lamina papyracea is intact. The right orbit, right retro-orbital fat, and right orbital walls are intact. The nasal bone and nasal septum is intact. The pterygoid plates appear unremarkable. There is left periorbital hematoma. There is near complete opacification of the left maxillary sinus with partial opacification of the left sphenoid sinus. Remainder of the visualized paranasal sinuses and mastoid air cells are well aerated. CT CERVICAL SPINE FINDINGS There is no acute fracture or subluxation of the cervical spine.There is osteopenia with multilevel degenerative changes of the spine.The odontoid and spinous processes are intact.There is normal anatomic alignment of the C1-C2 lateral masses. The visualized soft tissues appear unremarkable. There is a 0.8 x 0.7 cm ground-glass nodular density in the right apical region (series 402 image 76). IMPRESSION: No  acute intracranial hemorrhage. Age-related atrophy and chronic microvascular ischemic disease. No acute/traumatic cervical spine pathology. Left facial and orbital fractures as described. There is slight oval appearance of the left globe. Correlation with ophthalmological exam recommended. Electronically Signed: By: Anner Crete M.D. On: 01/25/2015 22:49    Review of Systems  All other systems reviewed and are negative.  Blood pressure 147/54, pulse 62, temperature 99.2 F (37.3 C), temperature source Oral, resp. rate 18, height 5' 1"  (1.549 m), weight 60.1 kg (132 lb 7.9 oz), SpO2 98 %. Physical Exam  Constitutional: She is oriented to person, place, and time. She appears well-developed and well-nourished.  HENT:  Head: Normocephalic.  Cardiovascular: Normal rate.   Respiratory: Effort normal.  Musculoskeletal:       Right wrist: She exhibits tenderness and swelling.       Left wrist: She exhibits tenderness, bony tenderness and deformity.  Bilateral distal radius fractures with minimal right displacement and left  dorsally displaced  Neurological: She is alert and oriented to person, place, and time.  Skin: Skin is warm.  Psychiatric: She has a normal mood and affect. Her behavior is normal. Judgment and thought content normal.    Assessment/Plan: Right side needs nonop treatment and left side needs either closed reduction or orif  Will try to coordinate with surgery  Caelen Reierson A 01/26/2015, 11:43 AM

## 2015-01-26 NOTE — Progress Notes (Signed)
Patient ID: Allison [name], female   DOB: March 29, 1926, 81 y.o.   MRN: [id number] I was asked to review the films on this 81 yo who fell. GCS 15. CT head shows what is likely a small amt of tSAH in the L frontal region and a tiny hyperdensity in the R parietal region, no mass effect or shift and likely of little clinical consequence. No need for repeat imaging unless some change in exam or MS changes.

## 2015-01-26 NOTE — ED Notes (Signed)
Otho tech at bedside.  

## 2015-01-26 NOTE — ED Notes (Signed)
Attempted report 

## 2015-01-26 NOTE — Progress Notes (Signed)
Orthopedic Tech Progress Note Patient Details:  Allison [name] 09/08/1926 [id number] Applied fiberglass short arm splints to LUE and RUE.  Bilateral pulses, sensation, motion intact before and after splinting.  Bilateral capillary refill less than 2 seconds before and after splinting. Ortho Devices Type of Ortho Device: Short arm splint Ortho Device/Splint Location: RUE, LUE Ortho Device/Splint Interventions: Application   Darrol Poke 01/26/2015, 1:26 AM

## 2015-01-26 NOTE — Progress Notes (Addendum)
Patient ID: Allison [name], female   DOB: December 02, 1926, 81 y.o.   MRN: [id number]    Subjective: B wrist pain, no HA  Objective: Vital signs in last 24 hours: Temp:  [97.6 F (36.4 C)-99.2 F (37.3 C)] 99.2 F (37.3 C) (12/24 0728) Pulse Rate:  [63-72] 67 (12/24 0800) Resp:  [15-27] 17 (12/24 0800) BP: (131-163)/(47-68) 139/54 mmHg (12/24 0800) SpO2:  [92 %-98 %] 97 % (12/24 0800) Weight:  [60.1 kg (132 lb 7.9 oz)-61.009 kg (134 lb 8 oz)] 60.1 kg (132 lb 7.9 oz) (12/24 0200) Last BM Date: 01/25/15  Intake/Output from previous day: 12/23 0701 - 12/24 0700 In: 375 [I.V.:375] Out: 300 [Urine:300] Intake/Output this shift: Total I/O In: 75 [I.V.:75] Out: -   General appearance: alert and cooperative Head: L periorbital edema and ecchymoses Resp: clear to auscultation bilaterally Cardio: regular rate and rhythm GI: soft, NT, ND, +BS Extremities: B wrist ace, fingers perfused Neuro: alert, F/C, speech fluent, oriented  Lab Results: CBC   Recent Labs  01/25/15 2304 01/26/15 0616  WBC 11.9* 9.3  HGB 13.1 13.2  HCT 40.1 40.8  PLT 169 169   BMET  Recent Labs  01/25/15 2304 01/26/15 0616  NA 143 145  K 3.8 4.0  CL 107 108  CO2 27 29  GLUCOSE 133* 132*  BUN 15 13  CREATININE 0.95 0.96  CALCIUM 9.5 9.2   PT/INR  Recent Labs  01/26/15 0616  LABPROT 15.1  INR 1.17   Anti-infectives: Anti-infectives    None      Assessment/Plan: Ground level fall TBI/small L frontal SDH/SAH/ICC - Dr. Ronnald Ramp to consult. GCS 15. L distal radius/ulnar styloid FX, R distal radius FX - Dr. Burney Gauze to consult L orbital floor/zygoma/max sinus FXs - Dr. Buelah Manis to consult. Appreciate optho consult by Dr. Maylene Roes. Noted concern for possible inferior rectus entrapment. HX R eye blindness - on Plavix at home - held with TBI FEN - NPO with possible OR pending. HTN - home norvasc. Hydralazine PRN VTE - PAS Dispo - ICU     LOS: 1 day    Georganna Skeans, MD, MPH, FACS Trauma:  [phone] General Surgery: [phone]  01/26/2015

## 2015-01-26 NOTE — Consult Note (Signed)
Reason for Consult:  Head injury Referring Physician:  Dr. Verita Lamb (trauma surgery)   Allison [name] is an 81 y.o. right-handed white female.  HPI:  Patient was brought to [facility] emergency room last night after having been found down at her home. It's unclear whether that she fell and struck her head and had a resulting loss of consciousness, or whether she passed out, fell, and struck her head. Patient was evaluated in the emergency room by the Dr. Verita Lamb from the trauma surgical service, CT of the head was obtained,  and he consulted Dr. Sherley Bounds from neurosurgery. Dr. Ronnald Ramp reviewed the patient's CT and left a brief note earlier this morning. I was called this afternoon because of a decline in the patient's neurologic status. CT scan of the brain without contrast was requested and obtained, and the patient is now seen in consultation. The patient's son and daughter are at her bedside.  Notably the patient has a significant history of cerebrovascular disease with a history of stroke involving loss of vision in the right eye due to a central artery occlusion, as well as a history of at least a couple of additional TIAs. She has peripheral vascular disease including significant carotid artery atherosclerotic disease. She also has a history of atherosclerotic coronary artery disease, but has not required stenting or surgical revascularization. She was on Plavix though prior to her hospitalization last night because of her cerebrovascular disease.  The patient denies headache, but does have tenderness around her left eye were there are facial and orbital fractures. She does have confusion and is unable to accurately explain the circumstances of her hospitalization, home medications, or medical conditions. That information was obtained from her son and daughter. According to the son and daughter, as well as her nurse, Mahala Menghini, the patient was able to accurately discuss her reason for  hospitalization, home medications, underlying medical conditions, and so on this morning, but by mid afternoon was having difficulty doing so.   In addition to the head injury as described above, she has left radius and ulnar fractures, right radius fracture, and facial and orbital fractures. There is also concern about her extraocular movements and potential issues involving the orbits and/or intraorbital structures.  Orthopedics, facial surgery, and ophthalmology have all been consulted per the record and Dr. Georganna Skeans (trauma surgery). The patient's family and her nurse explained that Dr. Harl Favor (orthopedic hand surgery)  has recommended surgery on the left wrist, but is awaiting facial trauma surgery consultation, to see whether their surgeries can be coordinated, so that is under one general anesthetic.   Past Medical History:  Past Medical History  Diagnosis Date  . HTN (hypertension)   . Aortic aneurysm ([place])   . Transient ischemic attack   . Murmur, cardiac   . Cholelithiasis   . Internal hemorrhoids   . Irritable bowel syndrome   . Depression   . Eustachian tube dysfunction     pt denies  . Appendicitis   . CVA (cerebral infarction)     Right retinal branch occlusion  . Carotid artery plaque     hx of per left side   . Stroke ([place])   . Legally blind in right eye, as defined in [place]     related to past CVA  . Anxiety   . Chronic kidney disease     hx of kidney stones  . GERD (gastroesophageal reflux disease)   . Allergy   . Arthritis   .  Cataract   . Clotting disorder ([place])   . Osteoporosis     Past Surgical History:  Past Surgical History  Procedure Laterality Date  . Abdominal aortic aneurysm repair  2005  . Kidney stones removed    . Appendectomy    . Abdominal hysterectomy    . Cholecystectomy    . Shoulder surgery      Left  . Wrist surgery      Right  . Tee without cardioversion  12/18/2011    Procedure: TRANSESOPHAGEAL ECHOCARDIOGRAM (TEE);   Surgeon: Larey Dresser, MD;  Location: [facility] ENDOSCOPY;  Service: Cardiovascular;  Laterality: N/A;  . Eye surgery      hx of lazy eye     Family History:  Family History  Problem Relation Age of Onset  . Asthma Brother     died age 31  . Diabetes Mother   . Coronary artery disease Mother     died age 43  . Coronary artery disease Father     died age 26  . Coronary artery disease Brother     died age 68  . Coronary artery disease Sister     died age 62    Social History:  reports that she quit smoking about 9 months ago. Her smoking use included Cigarettes. She has a 10 pack-year smoking history. She has never used smokeless tobacco. She reports that she does not drink alcohol or use illicit drugs.  Allergies:  Allergies  Allergen Reactions  . Shellfish Allergy Shortness Of Breath and Rash    All seafood  . Xylocaine [Lidocaine] Shortness Of Breath  . Biaxin [Clarithromycin]     unknown  . Celebrex [Celecoxib]     Unknown   . Cozaar [Losartan]     High blood pressure  . Demerol [Meperidine]     unknown  . Elavil [Amitriptyline]     unknown  . Fosamax [Alendronate]     unknown  . Hydromorphone     Hives   . Inderal [Propranolol]     unknown  . Ivp Dye [Iodinated Diagnostic Agents]     rash  . Levaquin [Levofloxacin]     unknown  . Macrodantin [Nitrofurantoin]     unknown  . Mycinette [Phenol]     Unknown   . Neggram [Nalidixic Acid]     unknown  . Novocain [Procaine]     unknown  . Pamelor [Nortriptyline]     unknown  . Penicillins     rash  . Robaxin [Methocarbamol]     unknown  . Tamiflu [Oseltamivir]     Unknown   . Tenormin [Atenolol]     unknown  . Triavil [Perphenazine-Amitriptyline]     Unknown   . Ampicillin Rash  . Paxil [Paroxetine] Rash  . Wellbutrin [Bupropion] Rash  . Zoloft [Sertraline] Rash    Medications: I have reviewed the patient's current medications.  ROS:  Basic review of systems is per the history of present illness  and past history, but because the patient's altered mental status and more detailed review of systems is not feasible.   Physical Examination: General:  Well-developed well-nourished white female in  no acute distress.  Blood pressure 98/47, pulse 59, temperature 98.4 F (36.9 C), temperature source Oral, resp. rate 13, height _0  (1.549 m), weight 60.1 kg (132 lb 7.9 oz), SpO2 96 %. Lungs:  Clear to auscultation, symmetrical respiratory excursion Heart:   Regular rate and rhythm, normal S1 and S2 Abdomen:  Soft, nondistended,  bowel sounds present Extremity:  Wrists splinted bilaterally  External:  Left raccoon eye, no Battle sign   Neurological Examination: Mental Status Examination:  Awake, alert, oriented to name, , and December, but not to year.  Speech fluent, following commands. Confused about circumstances of her hospitalization, her home medications, her medical conditions, and so on.  Cranial Nerve Examination:  Left pupil  2.5 mm, round, reactive to light. Limited movements of left eye particularly lateral deviation to the left. Somewhat decreased tone in left side of face, question whether due to swelling around facial and orbital injuries. Facial sensation intact. Hearing present. Palatal movements symmetrical. Shoulder shrug symmetrical. Tongue midline. Motor Examination:  No drift of upper extremities. Moving all 4 extremities well, although movement of distal upper extremities limited due to pain. Sensory Examination:  Intact to pinprick throughout.  Reflex Examination:  Symmetrical. Gait and Stance Examination:  Not tested due to the nature the patient's condition    Results for orders placed or performed during the hospital encounter of 01/25/15 (from the past 48 hour(s))  Basic metabolic panel     Status: Abnormal   Collection Time: 01/25/15 11:04 PM  Result Value Ref Range   Sodium 143 135 - 145 mmol/L   Potassium 3.8 3.5 - 5.1 mmol/L   Chloride 107 101 -  111 mmol/L   CO2 27 22 - 32 mmol/L   Glucose, Bld 133 (H) 65 - 99 mg/dL   BUN 15 6 - 20 mg/dL   Creatinine, Ser 0.95 0.44 - 1.00 mg/dL   Calcium 9.5 8.9 - 10.3 mg/dL   GFR calc non Af Amer 53 (L) >60 mL/min   GFR calc Af Amer >60 >60 mL/min    Comment: (NOTE) The eGFR has been calculated using the CKD EPI equation. This calculation has not been validated in all clinical situations. eGFR's persistently <60 mL/min signify possible Chronic Kidney Disease.    Anion gap 9 5 - 15  CBC with Differential     Status: Abnormal   Collection Time: 01/25/15 11:04 PM  Result Value Ref Range   WBC 11.9 (H) 4.0 - 10.5 K/uL   RBC 4.42 3.87 - 5.11 MIL/uL   Hemoglobin 13.1 12.0 - 15.0 g/dL   HCT 40.1 36.0 - 46.0 %   MCV 90.7 78.0 - 100.0 fL   MCH 29.6 26.0 - 34.0 pg   MCHC 32.7 30.0 - 36.0 g/dL   RDW 13.8 11.5 - 15.5 %   Platelets 169 150 - 400 K/uL   Neutrophils Relative % 83 %   Neutro Abs 9.8 (H) 1.7 - 7.7 K/uL   Lymphocytes Relative 7 %   Lymphs Abs 0.9 0.7 - 4.0 K/uL   Monocytes Relative 9 %   Monocytes Absolute 1.1 (H) 0.1 - 1.0 K/uL   Eosinophils Relative 1 %   Eosinophils Absolute 0.1 0.0 - 0.7 K/uL   Basophils Relative 0 %   Basophils Absolute 0.0 0.0 - 0.1 K/uL  Troponin I     Status: None   Collection Time: 01/25/15 11:16 PM  Result Value Ref Range   Troponin I <0.03 <0.031 ng/mL    Comment:        NO INDICATION OF MYOCARDIAL INJURY.   MRSA PCR Screening     Status: None   Collection Time: 01/26/15  2:02 AM  Result Value Ref Range   MRSA by PCR NEGATIVE NEGATIVE    Comment:        The GeneXpert MRSA Assay (FDA  approved for NASAL specimens only), is one component of a comprehensive MRSA colonization surveillance program. It is not intended to diagnose MRSA infection nor to guide or monitor treatment for MRSA infections.   Urinalysis, Routine w reflex microscopic (not at [facility])     Status: Abnormal   Collection Time: 01/26/15  4:39 AM  Result Value Ref Range    Color, Urine YELLOW YELLOW   APPearance CLOUDY (A) CLEAR   Specific Gravity, Urine 1.016 1.005 - 1.030   pH 7.0 5.0 - 8.0   Glucose, UA NEGATIVE NEGATIVE mg/dL   Hgb urine dipstick NEGATIVE NEGATIVE   Bilirubin Urine NEGATIVE NEGATIVE   Ketones, ur 15 (A) NEGATIVE mg/dL   Protein, ur 30 (A) NEGATIVE mg/dL   Nitrite NEGATIVE NEGATIVE   Leukocytes, UA SMALL (A) NEGATIVE  Urine microscopic-add on     Status: Abnormal   Collection Time: 01/26/15  4:39 AM  Result Value Ref Range   Squamous Epithelial / LPF 0-5 (A) NONE SEEN   WBC, UA 0-5 0 - 5 WBC/hpf   RBC / HPF NONE SEEN 0 - 5 RBC/hpf   Bacteria, UA FEW (A) NONE SEEN   Casts HYALINE CASTS (A) NEGATIVE  CBC     Status: None   Collection Time: 01/26/15  6:16 AM  Result Value Ref Range   WBC 9.3 4.0 - 10.5 K/uL   RBC 4.45 3.87 - 5.11 MIL/uL   Hemoglobin 13.2 12.0 - 15.0 g/dL   HCT 40.8 36.0 - 46.0 %   MCV 91.7 78.0 - 100.0 fL   MCH 29.7 26.0 - 34.0 pg   MCHC 32.4 30.0 - 36.0 g/dL   RDW 13.7 11.5 - 15.5 %   Platelets 169 150 - 400 K/uL  Protime-INR     Status: None   Collection Time: 01/26/15  6:16 AM  Result Value Ref Range   Prothrombin Time 15.1 11.6 - 15.2 seconds   INR 1.17 0.00 - 3.54  Basic metabolic panel     Status: Abnormal   Collection Time: 01/26/15  6:16 AM  Result Value Ref Range   Sodium 145 135 - 145 mmol/L   Potassium 4.0 3.5 - 5.1 mmol/L   Chloride 108 101 - 111 mmol/L   CO2 29 22 - 32 mmol/L   Glucose, Bld 132 (H) 65 - 99 mg/dL   BUN 13 6 - 20 mg/dL   Creatinine, Ser 0.96 0.44 - 1.00 mg/dL   Calcium 9.2 8.9 - 10.3 mg/dL   GFR calc non Af Amer 52 (L) >60 mL/min   GFR calc Af Amer >60 >60 mL/min    Comment: (NOTE) The eGFR has been calculated using the CKD EPI equation. This calculation has not been validated in all clinical situations. eGFR's persistently <60 mL/min signify possible Chronic Kidney Disease.    Anion gap 8 5 - 15  Glucose, capillary     Status: Abnormal   Collection Time: 01/26/15   3:09 PM  Result Value Ref Range   Glucose-Capillary 141 (H) 65 - 99 mg/dL    Dg Chest 1 View  01/25/2015  CLINICAL DATA:  Status post fall from standing, with concern for chest injury. Initial encounter. EXAM: CHEST 1 VIEW COMPARISON:  Chest radiograph performed 01/29/2014 FINDINGS: The lungs are well-aerated and clear. There is no evidence of focal opacification, pleural effusion or pneumothorax. The cardiomediastinal silhouette is borderline normal in size. No acute osseous abnormalities are seen. IMPRESSION: No acute cardiopulmonary process seen. No displaced rib fractures identified. Electronically  Signed   By: Garald Balding M.D.   On: 01/25/2015 22:38   Dg Wrist Complete Left  01/25/2015  CLINICAL DATA:  Fall today.  Wrist pain, swelling and deformity. EXAM: LEFT WRIST - COMPLETE 3+ VIEW COMPARISON:  None. FINDINGS: There are acute fractures of the distal radius and ulna. The radial fracture demonstrates significant posterior displacement and apex anterior angulation. Intra-articular extension of this fracture is difficult to exclude. The fracture does extend into the distal radioulnar joint which is mildly widened. There is a fracture of the ulnar styloid. The carpal bones appear intact and still wall articulate with the distal radius on the lateral view. IMPRESSION: Impacted and posteriorly displaced fractures of the distal radius, most consistent with a Colles fracture. Involvement of the distal articular surface of the radius is difficult to completely exclude. Ulnar styloid fracture. Electronically Signed   By: Richardean Sale M.D.   On: 01/25/2015 22:38   Dg Wrist Complete Right  01/25/2015  CLINICAL DATA:  Status post fall from standing, with right wrist swelling. Initial encounter. EXAM: RIGHT WRIST - COMPLETE 3+ VIEW COMPARISON:  None. FINDINGS: There is a mildly comminuted fracture involving the distal radial metaphysis, with suggestion of extension to the radiocarpal joint. Minimal  dorsal tilt is suggested. The carpal rows are intact, and demonstrate normal alignment. Soft tissue swelling is noted about the fracture sites. IMPRESSION: Mildly comminuted fracture involving the distal radial metaphysis, with suggestion of extension to the radiocarpal joint. Minimal dorsal tilt suggested, without significant displacement. Electronically Signed   By: Garald Balding M.D.   On: 01/25/2015 22:37   Ct Head Wo Contrast  01/26/2015  CLINICAL DATA:  Change in mental status following trauma EXAM: CT HEAD WITHOUT CONTRAST TECHNIQUE: Contiguous axial images were obtained from the base of the skull through the vertex without intravenous contrast. COMPARISON:  01/25/2015 FINDINGS: No skull fracture is noted. Atherosclerotic calcifications of carotid siphon again noted. Again noted minimal depressed fracture of the left zygoma. Again noted left preorbital soft tissue swelling and hematoma. Stable fracture of the posterior or wall of left maxillary sinus and partial opacification of left maxillary sinus. Again noted scalp swelling and mild subcutaneous stranding left posterior parietal region please see axial image 11. There is progression in size of intraparenchymal hematoma in left frontal lobe measures 2.1 by 2 cm. Stable small hemorrhagic focus in right parietal convexity posteriorly measures about 5 mm. There is a new hemorrhagic focus in left sylvian fissure axial image 11 measures 5 mm. Stable atrophy and chronic white matter disease. There is no midline shift. There is small amount of layering hemorrhage intraventricular bilateral posterior horn right greater than left please see axial image 12. Tiny focus of subarachnoid hemorrhage along the inter hemispheric intraventricular fissure axial image 16 measures about 5 mm. IMPRESSION: 1. Again noted fracture of the left zygoma and left posterior wall of maxillary sinus. Left preorbital soft tissue swelling again noted. 2. There is progression in size of  intraparenchymal hematoma in left frontal lobe measures 2.1 by 2 cm. Stable small hemorrhagic focus in right parietal convexity posteriorly measures about 5 mm. There is a new hemorrhagic focus in left sylvian fissure axial image 11 measures 5 mm. Stable atrophy and chronic white matter disease. There is no midline shift. There is small amount of layering hemorrhage intraventricular bilateral posterior horn right greater than left please see axial image 12. Tiny focus of subarachnoid hemorrhage along the inter hemispheric intraventricular fissure axial image 16 measures about 5  mm. Electronically Signed   By: Lahoma Crocker M.D.   On: 01/26/2015 16:38   Ct Head Wo Contrast  01/25/2015  ADDENDUM REPORT: 01/25/2015 23:06 ADDENDUM: Please note there is a small left frontal hemorrhage likely combination of a small subdural and subarachnoid. Left frontal cortical contusion also noted. A 5 mm round focus over the right parietal convexity (series 201, image 22) may also represent focal cortical contusion. Left occipital scalp hematoma. These results were called by telephone at the time of interpretation on 01/25/2015 at 11:06 pm to Dr. Sherian Maroon , who verbally acknowledged these results. Electronically Signed   By: Anner Crete M.D.   On: 01/25/2015 23:06  01/25/2015  CLINICAL DATA:  81 year old female with fall and left leg pain. EXAM: CT HEAD WITHOUT CONTRAST CT MAXILLOFACIAL WITHOUT CONTRAST CT CERVICAL SPINE WITHOUT CONTRAST TECHNIQUE: Multidetector CT imaging of the head, cervical spine, and maxillofacial structures were performed using the standard protocol without intravenous contrast. Multiplanar CT image reconstructions of the cervical spine and maxillofacial structures were also generated. COMPARISON:  Head CT dated 04/14/2014 FINDINGS: CT HEAD FINDINGS The ventricles are dilated and the sulci are prominent compatible with age-related atrophy. Periventricular and deep white matter hypodensities represent  chronic microvascular ischemic changes. There is no intracranial hemorrhage. No mass effect or midline shift identified. The visualized mastoid air cells are well aerated. There is opacification of the visualized left maxillary sinus and partial opacification of the left sphenoid sinus. Left periorbital hematoma. The calvarium is intact. CT MAXILLOFACIAL FINDINGS The mandible is intact. There is a nondisplaced fracture of the left zygomatic arch. There are fractures of the posterior lateral wall of the left maxillary sinus. There is fracture of the left orbital floor with fracture of the left inferior orbital rim. There is apparent abutment of the left inferior rectus muscle to the orbital floor fracture. Clinical correlation is recommended to evaluate for ocular muscle entrapment. There is a dysconjugate gaze. There is slight ovoid appearance of the left globe. Correlation with ophthalmological exam recommended. There is stranding of the left retro-orbital fat . Linear high density noted along the left orbital floor as well as along the roof of the left orbit compatible with hematoma. The lamina papyracea is intact. The right orbit, right retro-orbital fat, and right orbital walls are intact. The nasal bone and nasal septum is intact. The pterygoid plates appear unremarkable. There is left periorbital hematoma. There is near complete opacification of the left maxillary sinus with partial opacification of the left sphenoid sinus. Remainder of the visualized paranasal sinuses and mastoid air cells are well aerated. CT CERVICAL SPINE FINDINGS There is no acute fracture or subluxation of the cervical spine.There is osteopenia with multilevel degenerative changes of the spine.The odontoid and spinous processes are intact.There is normal anatomic alignment of the C1-C2 lateral masses. The visualized soft tissues appear unremarkable. There is a 0.8 x 0.7 cm ground-glass nodular density in the right apical region (series  402 image 76). IMPRESSION: No acute intracranial hemorrhage. Age-related atrophy and chronic microvascular ischemic disease. No acute/traumatic cervical spine pathology. Left facial and orbital fractures as described. There is slight oval appearance of the left globe. Correlation with ophthalmological exam recommended. Electronically Signed: By: Anner Crete M.D. On: 01/25/2015 22:49   Ct Cervical Spine Wo Contrast  01/25/2015  ADDENDUM REPORT: 01/25/2015 23:06 ADDENDUM: Please note there is a small left frontal hemorrhage likely combination of a small subdural and subarachnoid. Left frontal cortical contusion also noted. A 5 mm round  focus over the right parietal convexity (series 201, image 22) may also represent focal cortical contusion. Left occipital scalp hematoma. These results were called by telephone at the time of interpretation on 01/25/2015 at 11:06 pm to Dr. Sherian Maroon , who verbally acknowledged these results. Electronically Signed   By: Anner Crete M.D.   On: 01/25/2015 23:06  01/25/2015  CLINICAL DATA:  81 year old female with fall and left leg pain. EXAM: CT HEAD WITHOUT CONTRAST CT MAXILLOFACIAL WITHOUT CONTRAST CT CERVICAL SPINE WITHOUT CONTRAST TECHNIQUE: Multidetector CT imaging of the head, cervical spine, and maxillofacial structures were performed using the standard protocol without intravenous contrast. Multiplanar CT image reconstructions of the cervical spine and maxillofacial structures were also generated. COMPARISON:  Head CT dated 04/14/2014 FINDINGS: CT HEAD FINDINGS The ventricles are dilated and the sulci are prominent compatible with age-related atrophy. Periventricular and deep white matter hypodensities represent chronic microvascular ischemic changes. There is no intracranial hemorrhage. No mass effect or midline shift identified. The visualized mastoid air cells are well aerated. There is opacification of the visualized left maxillary sinus and partial  opacification of the left sphenoid sinus. Left periorbital hematoma. The calvarium is intact. CT MAXILLOFACIAL FINDINGS The mandible is intact. There is a nondisplaced fracture of the left zygomatic arch. There are fractures of the posterior lateral wall of the left maxillary sinus. There is fracture of the left orbital floor with fracture of the left inferior orbital rim. There is apparent abutment of the left inferior rectus muscle to the orbital floor fracture. Clinical correlation is recommended to evaluate for ocular muscle entrapment. There is a dysconjugate gaze. There is slight ovoid appearance of the left globe. Correlation with ophthalmological exam recommended. There is stranding of the left retro-orbital fat . Linear high density noted along the left orbital floor as well as along the roof of the left orbit compatible with hematoma. The lamina papyracea is intact. The right orbit, right retro-orbital fat, and right orbital walls are intact. The nasal bone and nasal septum is intact. The pterygoid plates appear unremarkable. There is left periorbital hematoma. There is near complete opacification of the left maxillary sinus with partial opacification of the left sphenoid sinus. Remainder of the visualized paranasal sinuses and mastoid air cells are well aerated. CT CERVICAL SPINE FINDINGS There is no acute fracture or subluxation of the cervical spine.There is osteopenia with multilevel degenerative changes of the spine.The odontoid and spinous processes are intact.There is normal anatomic alignment of the C1-C2 lateral masses. The visualized soft tissues appear unremarkable. There is a 0.8 x 0.7 cm ground-glass nodular density in the right apical region (series 402 image 76). IMPRESSION: No acute intracranial hemorrhage. Age-related atrophy and chronic microvascular ischemic disease. No acute/traumatic cervical spine pathology. Left facial and orbital fractures as described. There is slight oval  appearance of the left globe. Correlation with ophthalmological exam recommended. Electronically Signed: By: Anner Crete M.D. On: 01/25/2015 22:49   Ct Maxillofacial Wo Cm  01/25/2015  ADDENDUM REPORT: 01/25/2015 23:06 ADDENDUM: Please note there is a small left frontal hemorrhage likely combination of a small subdural and subarachnoid. Left frontal cortical contusion also noted. A 5 mm round focus over the right parietal convexity (series 201, image 22) may also represent focal cortical contusion. Left occipital scalp hematoma. These results were called by telephone at the time of interpretation on 01/25/2015 at 11:06 pm to Dr. Sherian Maroon , who verbally acknowledged these results. Electronically Signed   By: Laren Everts.D.  On: 01/25/2015 23:06  01/25/2015  CLINICAL DATA:  81 year old female with fall and left leg pain. EXAM: CT HEAD WITHOUT CONTRAST CT MAXILLOFACIAL WITHOUT CONTRAST CT CERVICAL SPINE WITHOUT CONTRAST TECHNIQUE: Multidetector CT imaging of the head, cervical spine, and maxillofacial structures were performed using the standard protocol without intravenous contrast. Multiplanar CT image reconstructions of the cervical spine and maxillofacial structures were also generated. COMPARISON:  Head CT dated 04/14/2014 FINDINGS: CT HEAD FINDINGS The ventricles are dilated and the sulci are prominent compatible with age-related atrophy. Periventricular and deep white matter hypodensities represent chronic microvascular ischemic changes. There is no intracranial hemorrhage. No mass effect or midline shift identified. The visualized mastoid air cells are well aerated. There is opacification of the visualized left maxillary sinus and partial opacification of the left sphenoid sinus. Left periorbital hematoma. The calvarium is intact. CT MAXILLOFACIAL FINDINGS The mandible is intact. There is a nondisplaced fracture of the left zygomatic arch. There are fractures of the posterior lateral wall  of the left maxillary sinus. There is fracture of the left orbital floor with fracture of the left inferior orbital rim. There is apparent abutment of the left inferior rectus muscle to the orbital floor fracture. Clinical correlation is recommended to evaluate for ocular muscle entrapment. There is a dysconjugate gaze. There is slight ovoid appearance of the left globe. Correlation with ophthalmological exam recommended. There is stranding of the left retro-orbital fat . Linear high density noted along the left orbital floor as well as along the roof of the left orbit compatible with hematoma. The lamina papyracea is intact. The right orbit, right retro-orbital fat, and right orbital walls are intact. The nasal bone and nasal septum is intact. The pterygoid plates appear unremarkable. There is left periorbital hematoma. There is near complete opacification of the left maxillary sinus with partial opacification of the left sphenoid sinus. Remainder of the visualized paranasal sinuses and mastoid air cells are well aerated. CT CERVICAL SPINE FINDINGS There is no acute fracture or subluxation of the cervical spine.There is osteopenia with multilevel degenerative changes of the spine.The odontoid and spinous processes are intact.There is normal anatomic alignment of the C1-C2 lateral masses. The visualized soft tissues appear unremarkable. There is a 0.8 x 0.7 cm ground-glass nodular density in the right apical region (series 402 image 76). IMPRESSION: No acute intracranial hemorrhage. Age-related atrophy and chronic microvascular ischemic disease. No acute/traumatic cervical spine pathology. Left facial and orbital fractures as described. There is slight oval appearance of the left globe. Correlation with ophthalmological exam recommended. Electronically Signed: By: Anner Crete M.D. On: 01/25/2015 22:49     Assessment/Plan:  Patient with multiple trauma admitted last night, including head injury with left  frontal hemorrhagic cerebral contusion, which is increased in size as compared to her initial CT 17-1/2 hours earlier. There is also small right parietal hemorrhagic cerebral contusion that is unchanged in size. There is no midline shift from the left frontal hemorrhagic cerebral contusion, but just mild localized mass effect. Notably the patient was on Plavix at home because of the significant history of cerebrovascular disease with history of both stroke and TIAs. Plavix has not been continued.  Her decline in neurologic function is understandable in consideration of the worsened appearance on CT, however there is no indication for neurosurgical intervention at this time. I feel the patient will need to continue off of antiplatelet agents and anticoagulants for at least a couple of weeks, and her CT will need to be monitored. I have requested  a follow-up CT scan for Monday, December 26 at 1245, but certainly if the patient declines prior to that the CT can be repeated earlier  I spoke with the patient's son and daughter both at her bedside, as well as in the ICU. I showed them her initial CT and subsequent CT images on the computer in the ICU. I've explained that certainly the increased prominence of the hemorrhagic cerebral contusion is not unusual, but certainly has been contributed to by her having been on Plavix prior to hospitalization. I further explained that it may continue to worsen, and that we will certainly need to monitor it with at least with a CT in a day and a half, but if she has significant further decline prior to that it can be repeated sooner. They have asked about a diet, however explained to them that I am wary of advancing her diet because of her neurologic decline and worsening CT scan appearance, and the risk of nausea, vomiting, and aspiration, with the attendant concerns.  I also explained to patient's son and daughter that she will need to be off of antiplatelet agents for at least  a couple of weeks, and during that period of time she does have risk of an increased risk of a cerebrovascular incident, but that balancing the risks of her head injury versus her cerebrovascular disease, at this time it is imperative that she be off of the antiplatelet agents. I further explained to them that during this holiday season the neurosurgeon on duty, covering her care will transition on nearly a daily basis.  Ultimately her neurosurgeon is Dr. Sherley Bounds, however he will be off duty until after the new year.  Their questions regarding her condition and our recommendations for continued care and follow-up from a neurosurgical perspective were explained to them.  I have also had the opportunity to speak with Dr. Georganna Skeans (trauma surgery) regarding my assessment and recommendations. I explained to he and the family that I favor, unless otherwise contraindicated, that surgery and general anesthetic be put off at least until after the follow-up CT, and neurosurgical review of it, on the morning of Monday, December 26.    Hosie Spangle, MD 01/26/2015, 4:45 PM

## 2015-01-27 LAB — BASIC METABOLIC PANEL
ANION GAP: 9 (ref 5–15)
BUN: 16 mg/dL (ref 6–20)
CALCIUM: 8.5 mg/dL — AB (ref 8.9–10.3)
CO2: 25 mmol/L (ref 22–32)
Chloride: 111 mmol/L (ref 101–111)
Creatinine, Ser: 0.72 mg/dL (ref 0.44–1.00)
GFR calc Af Amer: >60 mL/min (ref >60)
GFR calc non Af Amer: >60 mL/min (ref >60)
GLUCOSE: 133 mg/dL — AB (ref 65–99)
Potassium: 4.1 mmol/L (ref 3.5–5.1)
Sodium: 145 mmol/L (ref 135–145)

## 2015-01-27 LAB — CBC
HEMATOCRIT: 39.7 % (ref 36.0–46.0)
HEMOGLOBIN: 12.3 g/dL (ref 12.0–15.0)
MCH: 28.7 pg (ref 26.0–34.0)
MCHC: 31 g/dL (ref 30.0–36.0)
MCV: 92.5 fL (ref 78.0–100.0)
Platelets: 180 10*3/uL (ref 150–400)
RBC: 4.29 MIL/uL (ref 3.87–5.11)
RDW: 13.8 % (ref 11.5–15.5)
WBC: 7.3 10*3/uL (ref 4.0–10.5)

## 2015-01-27 MED ORDER — ACETAMINOPHEN 325 MG PO TABS
650.0000 mg | ORAL_TABLET | ORAL | Status: DC | PRN
  Administered 2015-01-27: 650 mg via ORAL
  Filled 2015-01-27: qty 2

## 2015-01-27 NOTE — Progress Notes (Signed)
Patient ID: Allison [name], female   DOB: 1926/07/14, 81 y.o.   MRN: [id number] Afeb, vss Awake, alert, and appropriately conversant; Will continue present rx. For repeat CT head tomorrow but looks good neurologically.

## 2015-01-27 NOTE — Progress Notes (Signed)
Subjective: Patient seems much more awake and alert at this time She is able to tell me what happened Not really having much pain The patient still has not been examined by Dr. Buelah Manis since he was consulted on the evening of 12/23.    Objective: Vital signs in last 24 hours: Temp:  [97.2 F (36.2 C)-98.4 F (36.9 C)] 97.5 F (36.4 C) (12/25 0800) Pulse Rate:  [37-67] 57 (12/25 0800) Resp:  [12-22] 12 (12/25 0800) BP: (94-149)/(41-98) 123/76 mmHg (12/25 0800) SpO2:  [95 %-100 %] 100 % (12/25 0800) Last BM Date: 01/25/15  Intake/Output from previous day: 12/24 0701 - 12/25 0700 In: 1900 [I.V.:1800; IV Piggyback:100] Out: 925 [Urine:925] Intake/Output this shift: Total I/O In: 75 [I.V.:75] Out: -   General appearance: elderly female - in NAD; resting comfortably Eyes: Right eye blind; left eye with limited EOMI; vision from that eye seems ok Resp: clear to auscultation bilaterally Cardio: regular rate and rhythm, S1, S2 normal, no murmur, click, rub or gallop GI: soft, non-tender; bowel sounds normal; no masses,  no organomegaly Extremities: B wrists in splints; minimal tenderness; fingers with normal movement Much less left periorbital edema - still with ecchymosis Neuro - awake, alert; oriented  Lab Results:   Recent Labs  01/26/15 0616 01/27/15 0528  WBC 9.3 7.3  HGB 13.2 12.3  HCT 40.8 39.7  PLT 169 180   BMET  Recent Labs  01/26/15 0616 01/27/15 0528  NA 145 145  K 4.0 4.1  CL 108 111  CO2 29 25  GLUCOSE 132* 133*  BUN 13 16  CREATININE 0.96 0.72  CALCIUM 9.2 8.5*   PT/INR  Recent Labs  01/26/15 0616  LABPROT 15.1  INR 1.17   ABG No results for input(s): PHART, HCO3 in the last 72 hours.  Invalid input(s): PCO2, PO2  Studies/Results: Dg Chest 1 View  01/25/2015  CLINICAL DATA:  Status post fall from standing, with concern for chest injury. Initial encounter. EXAM: CHEST 1 VIEW COMPARISON:  Chest radiograph performed 01/29/2014  FINDINGS: The lungs are well-aerated and clear. There is no evidence of focal opacification, pleural effusion or pneumothorax. The cardiomediastinal silhouette is borderline normal in size. No acute osseous abnormalities are seen. IMPRESSION: No acute cardiopulmonary process seen. No displaced rib fractures identified. Electronically Signed   By: Garald Balding M.D.   On: 01/25/2015 22:38   Dg Wrist Complete Left  01/25/2015  CLINICAL DATA:  Fall today.  Wrist pain, swelling and deformity. EXAM: LEFT WRIST - COMPLETE 3+ VIEW COMPARISON:  None. FINDINGS: There are acute fractures of the distal radius and ulna. The radial fracture demonstrates significant posterior displacement and apex anterior angulation. Intra-articular extension of this fracture is difficult to exclude. The fracture does extend into the distal radioulnar joint which is mildly widened. There is a fracture of the ulnar styloid. The carpal bones appear intact and still wall articulate with the distal radius on the lateral view. IMPRESSION: Impacted and posteriorly displaced fractures of the distal radius, most consistent with a Colles fracture. Involvement of the distal articular surface of the radius is difficult to completely exclude. Ulnar styloid fracture. Electronically Signed   By: Richardean Sale M.D.   On: 01/25/2015 22:38   Dg Wrist Complete Right  01/25/2015  CLINICAL DATA:  Status post fall from standing, with right wrist swelling. Initial encounter. EXAM: RIGHT WRIST - COMPLETE 3+ VIEW COMPARISON:  None. FINDINGS: There is a mildly comminuted fracture involving the distal radial metaphysis, with suggestion of  extension to the radiocarpal joint. Minimal dorsal tilt is suggested. The carpal rows are intact, and demonstrate normal alignment. Soft tissue swelling is noted about the fracture sites. IMPRESSION: Mildly comminuted fracture involving the distal radial metaphysis, with suggestion of extension to the radiocarpal joint.  Minimal dorsal tilt suggested, without significant displacement. Electronically Signed   By: Garald Balding M.D.   On: 01/25/2015 22:37   Ct Head Wo Contrast  01/26/2015  CLINICAL DATA:  Change in mental status following trauma EXAM: CT HEAD WITHOUT CONTRAST TECHNIQUE: Contiguous axial images were obtained from the base of the skull through the vertex without intravenous contrast. COMPARISON:  01/25/2015 FINDINGS: No skull fracture is noted. Atherosclerotic calcifications of carotid siphon again noted. Again noted minimal depressed fracture of the left zygoma. Again noted left preorbital soft tissue swelling and hematoma. Stable fracture of the posterior or wall of left maxillary sinus and partial opacification of left maxillary sinus. Again noted scalp swelling and mild subcutaneous stranding left posterior parietal region please see axial image 11. There is progression in size of intraparenchymal hematoma in left frontal lobe measures 2.1 by 2 cm. Stable small hemorrhagic focus in right parietal convexity posteriorly measures about 5 mm. There is a new hemorrhagic focus in left sylvian fissure axial image 11 measures 5 mm. Stable atrophy and chronic white matter disease. There is no midline shift. There is small amount of layering hemorrhage intraventricular bilateral posterior horn right greater than left please see axial image 12. Tiny focus of subarachnoid hemorrhage along the inter hemispheric intraventricular fissure axial image 16 measures about 5 mm. IMPRESSION: 1. Again noted fracture of the left zygoma and left posterior wall of maxillary sinus. Left preorbital soft tissue swelling again noted. 2. There is progression in size of intraparenchymal hematoma in left frontal lobe measures 2.1 by 2 cm. Stable small hemorrhagic focus in right parietal convexity posteriorly measures about 5 mm. There is a new hemorrhagic focus in left sylvian fissure axial image 11 measures 5 mm. Stable atrophy and chronic  white matter disease. There is no midline shift. There is small amount of layering hemorrhage intraventricular bilateral posterior horn right greater than left please see axial image 12. Tiny focus of subarachnoid hemorrhage along the inter hemispheric intraventricular fissure axial image 16 measures about 5 mm. Electronically Signed   By: Lahoma Crocker M.D.   On: 01/26/2015 16:38   Ct Head Wo Contrast  01/25/2015  ADDENDUM REPORT: 01/25/2015 23:06 ADDENDUM: Please note there is a small left frontal hemorrhage likely combination of a small subdural and subarachnoid. Left frontal cortical contusion also noted. A 5 mm round focus over the right parietal convexity (series 201, image 22) may also represent focal cortical contusion. Left occipital scalp hematoma. These results were called by telephone at the time of interpretation on 01/25/2015 at 11:06 pm to Dr. Sherian Maroon , who verbally acknowledged these results. Electronically Signed   By: Anner Crete M.D.   On: 01/25/2015 23:06  01/25/2015  CLINICAL DATA:  81 year old female with fall and left leg pain. EXAM: CT HEAD WITHOUT CONTRAST CT MAXILLOFACIAL WITHOUT CONTRAST CT CERVICAL SPINE WITHOUT CONTRAST TECHNIQUE: Multidetector CT imaging of the head, cervical spine, and maxillofacial structures were performed using the standard protocol without intravenous contrast. Multiplanar CT image reconstructions of the cervical spine and maxillofacial structures were also generated. COMPARISON:  Head CT dated 04/14/2014 FINDINGS: CT HEAD FINDINGS The ventricles are dilated and the sulci are prominent compatible with age-related atrophy. Periventricular and deep white matter  hypodensities represent chronic microvascular ischemic changes. There is no intracranial hemorrhage. No mass effect or midline shift identified. The visualized mastoid air cells are well aerated. There is opacification of the visualized left maxillary sinus and partial opacification of the left  sphenoid sinus. Left periorbital hematoma. The calvarium is intact. CT MAXILLOFACIAL FINDINGS The mandible is intact. There is a nondisplaced fracture of the left zygomatic arch. There are fractures of the posterior lateral wall of the left maxillary sinus. There is fracture of the left orbital floor with fracture of the left inferior orbital rim. There is apparent abutment of the left inferior rectus muscle to the orbital floor fracture. Clinical correlation is recommended to evaluate for ocular muscle entrapment. There is a dysconjugate gaze. There is slight ovoid appearance of the left globe. Correlation with ophthalmological exam recommended. There is stranding of the left retro-orbital fat . Linear high density noted along the left orbital floor as well as along the roof of the left orbit compatible with hematoma. The lamina papyracea is intact. The right orbit, right retro-orbital fat, and right orbital walls are intact. The nasal bone and nasal septum is intact. The pterygoid plates appear unremarkable. There is left periorbital hematoma. There is near complete opacification of the left maxillary sinus with partial opacification of the left sphenoid sinus. Remainder of the visualized paranasal sinuses and mastoid air cells are well aerated. CT CERVICAL SPINE FINDINGS There is no acute fracture or subluxation of the cervical spine.There is osteopenia with multilevel degenerative changes of the spine.The odontoid and spinous processes are intact.There is normal anatomic alignment of the C1-C2 lateral masses. The visualized soft tissues appear unremarkable. There is a 0.8 x 0.7 cm ground-glass nodular density in the right apical region (series 402 image 76). IMPRESSION: No acute intracranial hemorrhage. Age-related atrophy and chronic microvascular ischemic disease. No acute/traumatic cervical spine pathology. Left facial and orbital fractures as described. There is slight oval appearance of the left globe.  Correlation with ophthalmological exam recommended. Electronically Signed: By: Anner Crete M.D. On: 01/25/2015 22:49   Ct Cervical Spine Wo Contrast  01/25/2015  ADDENDUM REPORT: 01/25/2015 23:06 ADDENDUM: Please note there is a small left frontal hemorrhage likely combination of a small subdural and subarachnoid. Left frontal cortical contusion also noted. A 5 mm round focus over the right parietal convexity (series 201, image 22) may also represent focal cortical contusion. Left occipital scalp hematoma. These results were called by telephone at the time of interpretation on 01/25/2015 at 11:06 pm to Dr. Sherian Maroon , who verbally acknowledged these results. Electronically Signed   By: Anner Crete M.D.   On: 01/25/2015 23:06  01/25/2015  CLINICAL DATA:  81 year old female with fall and left leg pain. EXAM: CT HEAD WITHOUT CONTRAST CT MAXILLOFACIAL WITHOUT CONTRAST CT CERVICAL SPINE WITHOUT CONTRAST TECHNIQUE: Multidetector CT imaging of the head, cervical spine, and maxillofacial structures were performed using the standard protocol without intravenous contrast. Multiplanar CT image reconstructions of the cervical spine and maxillofacial structures were also generated. COMPARISON:  Head CT dated 04/14/2014 FINDINGS: CT HEAD FINDINGS The ventricles are dilated and the sulci are prominent compatible with age-related atrophy. Periventricular and deep white matter hypodensities represent chronic microvascular ischemic changes. There is no intracranial hemorrhage. No mass effect or midline shift identified. The visualized mastoid air cells are well aerated. There is opacification of the visualized left maxillary sinus and partial opacification of the left sphenoid sinus. Left periorbital hematoma. The calvarium is intact. CT MAXILLOFACIAL FINDINGS The mandible is  intact. There is a nondisplaced fracture of the left zygomatic arch. There are fractures of the posterior lateral wall of the left maxillary  sinus. There is fracture of the left orbital floor with fracture of the left inferior orbital rim. There is apparent abutment of the left inferior rectus muscle to the orbital floor fracture. Clinical correlation is recommended to evaluate for ocular muscle entrapment. There is a dysconjugate gaze. There is slight ovoid appearance of the left globe. Correlation with ophthalmological exam recommended. There is stranding of the left retro-orbital fat . Linear high density noted along the left orbital floor as well as along the roof of the left orbit compatible with hematoma. The lamina papyracea is intact. The right orbit, right retro-orbital fat, and right orbital walls are intact. The nasal bone and nasal septum is intact. The pterygoid plates appear unremarkable. There is left periorbital hematoma. There is near complete opacification of the left maxillary sinus with partial opacification of the left sphenoid sinus. Remainder of the visualized paranasal sinuses and mastoid air cells are well aerated. CT CERVICAL SPINE FINDINGS There is no acute fracture or subluxation of the cervical spine.There is osteopenia with multilevel degenerative changes of the spine.The odontoid and spinous processes are intact.There is normal anatomic alignment of the C1-C2 lateral masses. The visualized soft tissues appear unremarkable. There is a 0.8 x 0.7 cm ground-glass nodular density in the right apical region (series 402 image 76). IMPRESSION: No acute intracranial hemorrhage. Age-related atrophy and chronic microvascular ischemic disease. No acute/traumatic cervical spine pathology. Left facial and orbital fractures as described. There is slight oval appearance of the left globe. Correlation with ophthalmological exam recommended. Electronically Signed: By: Anner Crete M.D. On: 01/25/2015 22:49   Ct Maxillofacial Wo Cm  01/25/2015  ADDENDUM REPORT: 01/25/2015 23:06 ADDENDUM: Please note there is a small left frontal  hemorrhage likely combination of a small subdural and subarachnoid. Left frontal cortical contusion also noted. A 5 mm round focus over the right parietal convexity (series 201, image 22) may also represent focal cortical contusion. Left occipital scalp hematoma. These results were called by telephone at the time of interpretation on 01/25/2015 at 11:06 pm to Dr. Sherian Maroon , who verbally acknowledged these results. Electronically Signed   By: Anner Crete M.D.   On: 01/25/2015 23:06  01/25/2015  CLINICAL DATA:  81 year old female with fall and left leg pain. EXAM: CT HEAD WITHOUT CONTRAST CT MAXILLOFACIAL WITHOUT CONTRAST CT CERVICAL SPINE WITHOUT CONTRAST TECHNIQUE: Multidetector CT imaging of the head, cervical spine, and maxillofacial structures were performed using the standard protocol without intravenous contrast. Multiplanar CT image reconstructions of the cervical spine and maxillofacial structures were also generated. COMPARISON:  Head CT dated 04/14/2014 FINDINGS: CT HEAD FINDINGS The ventricles are dilated and the sulci are prominent compatible with age-related atrophy. Periventricular and deep white matter hypodensities represent chronic microvascular ischemic changes. There is no intracranial hemorrhage. No mass effect or midline shift identified. The visualized mastoid air cells are well aerated. There is opacification of the visualized left maxillary sinus and partial opacification of the left sphenoid sinus. Left periorbital hematoma. The calvarium is intact. CT MAXILLOFACIAL FINDINGS The mandible is intact. There is a nondisplaced fracture of the left zygomatic arch. There are fractures of the posterior lateral wall of the left maxillary sinus. There is fracture of the left orbital floor with fracture of the left inferior orbital rim. There is apparent abutment of the left inferior rectus muscle to the orbital floor fracture. Clinical correlation  is recommended to evaluate for ocular muscle  entrapment. There is a dysconjugate gaze. There is slight ovoid appearance of the left globe. Correlation with ophthalmological exam recommended. There is stranding of the left retro-orbital fat . Linear high density noted along the left orbital floor as well as along the roof of the left orbit compatible with hematoma. The lamina papyracea is intact. The right orbit, right retro-orbital fat, and right orbital walls are intact. The nasal bone and nasal septum is intact. The pterygoid plates appear unremarkable. There is left periorbital hematoma. There is near complete opacification of the left maxillary sinus with partial opacification of the left sphenoid sinus. Remainder of the visualized paranasal sinuses and mastoid air cells are well aerated. CT CERVICAL SPINE FINDINGS There is no acute fracture or subluxation of the cervical spine.There is osteopenia with multilevel degenerative changes of the spine.The odontoid and spinous processes are intact.There is normal anatomic alignment of the C1-C2 lateral masses. The visualized soft tissues appear unremarkable. There is a 0.8 x 0.7 cm ground-glass nodular density in the right apical region (series 402 image 76). IMPRESSION: No acute intracranial hemorrhage. Age-related atrophy and chronic microvascular ischemic disease. No acute/traumatic cervical spine pathology. Left facial and orbital fractures as described. There is slight oval appearance of the left globe. Correlation with ophthalmological exam recommended. Electronically Signed: By: Anner Crete M.D. On: 01/25/2015 22:49    Anti-infectives: Anti-infectives    None      Assessment/Plan: s/p Ground level fall in bathroom on  TBI/small L frontal SDH/SAH/ICC - Followed by Neurosurgery - Jones/ Nudelman. GCS 15.  Some worsening of the hemorrhagic cerebral contusion in the left frontal lobe (Plavix).  Neuro status is better.  Repeat CT scan 12/26 per Dr. Sherwood Gambler L distal radius/ulnar styloid FX, R  distal radius FX - Dr. Burney Gauze; plan left closed reduction or ORIF; he wants to coordinate with her facial fracture surgery L orbital floor/zygoma/max sinus FXs - Dr. Buelah Manis to consult to determine timing of surgery with Hand Surgery.  Still has not been seen by him.  Decadron ordered for swelling.  Appreciate optho consult by Dr. Maylene Roes. Noted concern for possible inferior rectus entrapment. HX R eye blindness - on Plavix at home - held with TBI FEN - clear liquids; awaiting surgery plan HTN - home norvasc. Hydralazine PRN VTE - PAS Dispo - ICU   LOS: 2 days    Rana Adorno K. 01/27/2015

## 2015-01-28 ENCOUNTER — Inpatient Hospital Stay (HOSPITAL_COMMUNITY): Payer: Medicare Other

## 2015-01-28 ENCOUNTER — Encounter: Payer: Self-pay | Admitting: Ophthalmology

## 2015-01-28 MED ORDER — CLINDAMYCIN HCL 300 MG PO CAPS
300.0000 mg | ORAL_CAPSULE | Freq: Four times a day (QID) | ORAL | Status: DC
  Administered 2015-01-28 – 2015-01-30 (×6): 300 mg via ORAL
  Filled 2015-01-28 (×12): qty 1

## 2015-01-28 MED ORDER — LEVETIRACETAM 500 MG PO TABS
500.0000 mg | ORAL_TABLET | Freq: Two times a day (BID) | ORAL | Status: DC
  Administered 2015-01-28 – 2015-01-29 (×4): 500 mg via ORAL
  Filled 2015-01-28 (×4): qty 1

## 2015-01-28 NOTE — Progress Notes (Signed)
Patient ID: Allison [name], female   DOB: 11-Jul-1926, 81 y.o.   MRN: [id number]    Subjective: Some HA and wrist pain  Objective: Vital signs in last 24 hours: Temp:  [97.4 F (36.3 C)-98 F (36.7 C)] 97.4 F (36.3 C) (12/26 0400) Pulse Rate:  [43-81] 55 (12/26 0700) Resp:  [13-22] 17 (12/26 0700) BP: (87-139)/(33-104) 138/48 mmHg (12/26 0700) SpO2:  [90 %-100 %] 96 % (12/26 0700) Last BM Date: 01/25/15  Intake/Output from previous day: 12/25 0701 - 12/26 0700 In: 1825 [I.V.:1725; IV Piggyback:100] Out: 2350 [Urine:2350] Intake/Output this shift:    General appearance: cooperative Head: L facial and periorbital edema improving, able to move L eye some Resp: clear to auscultation bilaterally Cardio: regular rate and rhythm GI: soft, NT, ND Extremities: B wrist ace Neuro: alert and F/C well, amnestic to event  Lab Results: CBC   Recent Labs  01/26/15 0616 01/27/15 0528  WBC 9.3 7.3  HGB 13.2 12.3  HCT 40.8 39.7  PLT 169 180   BMET  Recent Labs  01/26/15 0616 01/27/15 0528  NA 145 145  K 4.0 4.1  CL 108 111  CO2 29 25  GLUCOSE 132* 133*  BUN 13 16  CREATININE 0.96 0.72  CALCIUM 9.2 8.5*   Anti-infectives: Anti-infectives    None      Assessment/Plan: TBI/small L frontal SDH/SAH/ICC - neuro status improved a bit, repeat CT head in AM per Dr. Hal Neer L distal radius/ulnar styloid FX, R distal radius FX - Dr. Burney Gauze; plan left closed reduction or ORIF; he wants to coordinate with her facial fracture surgery L orbital floor/zygoma/max sinus FXs - Dr. Buelah Manis to consult to determine timing of surgery with Hand Surgery.  Still has not been seen by him.  Decadron ordered for swelling and swelling has improved some.  Appreciate optho consult by Dr. Maylene Roes. Noted concern for possible inferior rectus entrapment. HX R eye blindness - on Plavix at home - held with TBI FEN - advance to soft diet, BMET in AM HTN - home norvasc. Hydralazine PRN VTE - PAS Dispo -  ICU    LOS: 3 days    Georganna Skeans, MD, MPH, FACS Trauma: [phone] General Surgery: [phone]  01/28/2015

## 2015-01-28 NOTE — Progress Notes (Signed)
Fluctuating mental status per nursing staff AVSS Awake, alert, oriented Moves all extremities well, hands bandaged CT head: Stable left frontal hemorrhage Doing well Fluctuating mental status could potentially be due to seizures, start Keppra 500 mg by mouth twice a day

## 2015-01-28 NOTE — Progress Notes (Signed)
Patient seen at bedside this am  NVI bilateral UE  Have discussed ORIF left distal radius fracture in the near future   i am available Wednesday afternoon and Friday afternnon  Can move cases around if morning is better but will need a little advance warning  Call for any acute changes

## 2015-01-28 NOTE — Progress Notes (Signed)
S: asked to repeat eval pt now that periorbital edema improved. Pt denies eye pain. She reports va better. O:  [facility] N[facility], 20/70-2 with +2.50 Pupil: apd od EOM: full od, -1 supraduction, adduction, full infraduction and abduction CVF: unable od, full grossly Pupil 16,18 tonopen  L/l: wnl od, hematoma lul, sp lac repair Conj: w+q, sch 360 K: clear ou Ac:d+q ou Lens: +ns ou Iris: wnl ou fundus Vit clear ou Optic nerve 0.4 pale od, s+p os Mac flat ou Vessel wnl ou Periphery flat to best view   A/p:  1. Orbital fx os: nonsurgical per orbital specialist. 2. subconj hemorrhage os; expect self resolution. 3. Eyelid lac and hematoma os: sp repair. Expect self resolution of hematoma. Ice pack to left eyelid 4. Blind od: hx of rvo od. Monocular precautions reviewed. 5. Cataracts ou: not visually sig. Observe. 6. Ocular hypertension os: improved.  Fu after discharge with pt's ophthalmologist, Dr. Gershon Crane, within a week

## 2015-01-28 NOTE — Consult Note (Addendum)
ORAL & MAXILLOFACIAL SURGERY   Reason for Consult: Left facial fractures Referring Physician: Trauma   Allison [name] is an 81 y.o. female.  HPI: The patient sustained a ground level fall at home on 01/25/2015 caused by syncope and sustained a left zygoma fracture, left inferior orbital wall fracture, and left maxillary sinus fracture.  I was originally consulted and was only informed of the above injuries, for which I recommend that the patient follow up on an outpatient basis this week and that the patient take decadron (to decrease orbital edema) and antibiotics.    I was consulted today by the Trauma service and the Nurse Practitioner explained that the patient had actually be admitted on 01/25/15 because in addition to the facial fractures she had suffered a SDH, SAH, bilateral wrist fractures as well.     PMHx:  Past Medical History  Diagnosis Date  . HTN (hypertension)   . Aortic aneurysm ([place])   . Transient ischemic attack   . Murmur, cardiac   . Cholelithiasis   . Internal hemorrhoids   . Irritable bowel syndrome   . Depression   . Eustachian tube dysfunction     pt denies  . Appendicitis   . CVA (cerebral infarction)     Right retinal branch occlusion  . Carotid artery plaque     hx of per left side   . Stroke ([place])   . Legally blind in right eye, as defined in [place]     related to past CVA  . Anxiety   . Chronic kidney disease     hx of kidney stones  . GERD (gastroesophageal reflux disease)   . Allergy   . Arthritis   . Cataract   . Clotting disorder ([place])   . Osteoporosis     PSx:  Past Surgical History  Procedure Laterality Date  . Abdominal aortic aneurysm repair  2005  . Kidney stones removed    . Appendectomy    . Abdominal hysterectomy    . Cholecystectomy    . Shoulder surgery      Left  . Wrist surgery      Right  . Tee without cardioversion  12/18/2011    Procedure: TRANSESOPHAGEAL ECHOCARDIOGRAM (TEE);  Surgeon: Larey Dresser, MD;   Location: [facility] ENDOSCOPY;  Service: Cardiovascular;  Laterality: N/A;  . Eye surgery      hx of lazy eye     Family Hx:  Family History  Problem Relation Age of Onset  . Asthma Brother     died age 59  . Diabetes Mother   . Coronary artery disease Mother     died age 44  . Coronary artery disease Father     died age 13  . Coronary artery disease Brother     died age 79  . Coronary artery disease Sister     died age 62    Social Hx:  reports that she quit smoking about 9 months ago. Her smoking use included Cigarettes. She has a 10 pack-year smoking history. She has never used smokeless tobacco. She reports that she does not drink alcohol or use illicit drugs.  Allergies:  Allergies  Allergen Reactions  . Shellfish Allergy Shortness Of Breath and Rash    All seafood  . Xylocaine [Lidocaine] Shortness Of Breath  . Biaxin [Clarithromycin]     unknown  . Celebrex [Celecoxib]     Unknown   . Cozaar [Losartan]     High blood pressure  .  Demerol [Meperidine]     unknown  . Elavil [Amitriptyline]     unknown  . Fosamax [Alendronate]     unknown  . Hydromorphone     Hives   . Inderal [Propranolol]     unknown  . Ivp Dye [Iodinated Diagnostic Agents]     rash  . Levaquin [Levofloxacin]     unknown  . Macrodantin [Nitrofurantoin]     unknown  . Mycinette [Phenol]     Unknown   . Neggram [Nalidixic Acid]     unknown  . Novocain [Procaine]     unknown  . Pamelor [Nortriptyline]     unknown  . Penicillins     rash  . Robaxin [Methocarbamol]     unknown  . Tamiflu [Oseltamivir]     Unknown   . Tenormin [Atenolol]     unknown  . Triavil [Perphenazine-Amitriptyline]     Unknown   . Ampicillin Rash  . Paxil [Paroxetine] Rash  . Wellbutrin [Bupropion] Rash  . Zoloft [Sertraline] Rash    Medications: I have reviewed the patient's current medications.  Labs:  Results for orders placed or performed during the hospital encounter of 01/25/15 (from the past 48  hour(s))  Glucose, capillary     Status: Abnormal   Collection Time: 01/26/15  3:09 PM  Result Value Ref Range   Glucose-Capillary 141 (H) 65 - 99 mg/dL  Basic metabolic panel     Status: Abnormal   Collection Time: 01/27/15  5:28 AM  Result Value Ref Range   Sodium 145 135 - 145 mmol/L   Potassium 4.1 3.5 - 5.1 mmol/L   Chloride 111 101 - 111 mmol/L   CO2 25 22 - 32 mmol/L   Glucose, Bld 133 (H) 65 - 99 mg/dL   BUN 16 6 - 20 mg/dL   Creatinine, Ser 0.72 0.44 - 1.00 mg/dL   Calcium 8.5 (L) 8.9 - 10.3 mg/dL   GFR calc non Af Amer >60 >60 mL/min   GFR calc Af Amer >60 >60 mL/min    Comment: (NOTE) The eGFR has been calculated using the CKD EPI equation. This calculation has not been validated in all clinical situations. eGFR's persistently <60 mL/min signify possible Chronic Kidney Disease.    Anion gap 9 5 - 15  CBC     Status: None   Collection Time: 01/27/15  5:28 AM  Result Value Ref Range   WBC 7.3 4.0 - 10.5 K/uL   RBC 4.29 3.87 - 5.11 MIL/uL   Hemoglobin 12.3 12.0 - 15.0 g/dL   HCT 39.7 36.0 - 46.0 %   MCV 92.5 78.0 - 100.0 fL   MCH 28.7 26.0 - 34.0 pg   MCHC 31.0 30.0 - 36.0 g/dL   RDW 13.8 11.5 - 15.5 %   Platelets 180 150 - 400 K/uL    Radiology: Ct Head Wo Contrast  01/28/2015  CLINICAL DATA:  Traumatic brain injury. Left-sided headaches. Intraparenchymal hemorrhage. EXAM: CT HEAD WITHOUT CONTRAST TECHNIQUE: Contiguous axial images were obtained from the base of the skull through the vertex without intravenous contrast. COMPARISON:  CT head without contrast 12/ 26/16 at 5:13 a.m. FINDINGS: The left frontal parenchymal and subarachnoid/subdural hemorrhage is not significantly changed from the prior exam. No new hemorrhage is present. Moderate atrophy and white matter disease is present bilaterally. An arachnoid cyst over the left frontal convexity is again noted. Asymmetric atrophy is present over the convexities. This may reflect some chronic mass effect on the left.  IMPRESSION:  1. Stable appearance of the anterior left frontal parenchymal and extra-axial hemorrhage. 2. Stable atrophy and white matter disease. 3. No new hemorrhage. Electronically Signed   By: San Morelle M.D.   On: 01/28/2015 11:40   Ct Head Wo Contrast  01/28/2015  CLINICAL DATA:  Found down at home, follow-up intracranial hemorrhage. EXAM: CT HEAD WITHOUT CONTRAST TECHNIQUE: Contiguous axial images were obtained from the base of the skull through the vertex without intravenous contrast. COMPARISON:  CT head January 26, 2015  and MRI brain April 15, 2014 FINDINGS: Evolving LEFT frontal 2.4 x 2.6 cm intraparenchymal hematoma with surrounding low-density vasogenic edema. Subcentimeter cortical based versus subarachnoid hemorrhage RIGHT parietal lobe/sulci. Small amount of LEFT frontotemporal subarachnoid hemorrhage. Small amount of dependent blood products in the occipital horns. No hydrocephalus. Moderate ventriculomegaly on the basis of global parenchymal brain volume loss. Patchy to confluent supratentorial and pontine white matter hypodensities compatible chronic small vessel ischemic disease. Old LEFT thalamus lacunar infarct. Old small RIGHT cerebellar infarct. 4 mm density along RIGHT falx most compatible with blood products, as prior new from prior MRI. Basal cisterns are patent. Severe calcific atherosclerosis the carotid siphons. Partially imaged LEFT maxillary and sphenoid hemo sinus. The mastoid air cells are well aerated. LEFT periorbital soft tissue swelling is a hematoma. LEFT occipital small scalp hematoma. No skull fracture. IMPRESSION: Evolving LEFT frontal intraparenchymal hematoma. Subcentimeter RIGHT parietal cortical hemorrhagic contusion versus subarachnoid hemorrhage. Small amount of LEFT frontotemporal subarachnoid hemorrhage. Re- distributed intraventricular blood products without hydrocephalus. Electronically Signed   By: Elon Alas M.D.   On: 01/28/2015 05:36    Ct Head Wo Contrast  01/26/2015  CLINICAL DATA:  Change in mental status following trauma EXAM: CT HEAD WITHOUT CONTRAST TECHNIQUE: Contiguous axial images were obtained from the base of the skull through the vertex without intravenous contrast. COMPARISON:  01/25/2015 FINDINGS: No skull fracture is noted. Atherosclerotic calcifications of carotid siphon again noted. Again noted minimal depressed fracture of the left zygoma. Again noted left preorbital soft tissue swelling and hematoma. Stable fracture of the posterior or wall of left maxillary sinus and partial opacification of left maxillary sinus. Again noted scalp swelling and mild subcutaneous stranding left posterior parietal region please see axial image 11. There is progression in size of intraparenchymal hematoma in left frontal lobe measures 2.1 by 2 cm. Stable small hemorrhagic focus in right parietal convexity posteriorly measures about 5 mm. There is a new hemorrhagic focus in left sylvian fissure axial image 11 measures 5 mm. Stable atrophy and chronic white matter disease. There is no midline shift. There is small amount of layering hemorrhage intraventricular bilateral posterior horn right greater than left please see axial image 12. Tiny focus of subarachnoid hemorrhage along the inter hemispheric intraventricular fissure axial image 16 measures about 5 mm. IMPRESSION: 1. Again noted fracture of the left zygoma and left posterior wall of maxillary sinus. Left preorbital soft tissue swelling again noted. 2. There is progression in size of intraparenchymal hematoma in left frontal lobe measures 2.1 by 2 cm. Stable small hemorrhagic focus in right parietal convexity posteriorly measures about 5 mm. There is a new hemorrhagic focus in left sylvian fissure axial image 11 measures 5 mm. Stable atrophy and chronic white matter disease. There is no midline shift. There is small amount of layering hemorrhage intraventricular bilateral posterior horn  right greater than left please see axial image 12. Tiny focus of subarachnoid hemorrhage along the inter hemispheric intraventricular fissure axial image 16 measures about 5  mm. Electronically Signed   By: Lahoma Crocker M.D.   On: 01/26/2015 16:38    JSR:[id number] items are noted in HPI.  Vital Signs: BP 130/55 mmHg  Pulse 64  Temp(Src) 98.1 F (36.7 C) (Oral)  Resp 18  Ht 5' 1"  (1.549 m)  Wt 60.1 kg (132 lb 7.9 oz)  BMI 25.05 kg/m2  SpO2 97%  Physical Exam:  General: The patient is alert and cooperative Head: The patient is normocephalic and post-traumatic.  She has edema along the left zygoma and tenderness to examination.  The patient's cranial nerves V and VII are intact within the face.   Eyes: The patient has complete visual loss in the right eye due to a previous CVA; The left eye has significant periorbital edema and ecchymosis.  The patient has subconjunctival hemorrhage to infinity.  She reports that her swelling has improved to the point where she can open her eye.  Her EOM appear to be intact.  She still has a large amount of edema, but she can move the left eye in all directions.  She does have some blurriness in the field, but this is due to swelling.  She has no diplopia (she has monocular vision).  There is a small linear laceration in the left superior brow region which was closed by the ER and has no signs of infection. Ears: TM's intact bilaterally Nose: No septal hematoma present.  Mouth/Throat: Maxillary, Mandibular denture intact and bite is stable.  There are no gross stepoffs or deformities in the maxilla and mandible.    Assessment/Plan: May is a 81 year old female that is s/p GLF with a left orbital floor fracture, left maxillary sinus fracture, and left zygoma fracture all minimally displaced.    1.  Given the patient has monocular vision due to previous right eye visual loss and her improved examination in regards to movement of her extraoccular muscles I would  recommend no surgery at this time.  2. I explained to the patient and her family that the risks outweigh the benefits of surgery at this time.  According to my examination, the patient has appropriate visual acuity at this time and her eye can move now that swelling is resolving.   However, if we intervene and attempt to repair the orbital floor there may be bleeding which could cause a retrobulbar hematoma which could cause vision loss in the left eye (which would mean complete blindness as the left eye is currently the only functioning eye).  The zygoma and the sinus will heal without intervention as well. 3. The patient requested that I call her son, Dr. Serita Grit -  [phone].  I called and discussed her case as above and he is in agreement.  4. We did discuss having a second visual acuity test by Dr. Jeffie Pollock (who initially saw her) and I have paged him at [phone] requesting a follow up examination now that the left orbital edema has resolved.    Harmony,Nikkolas Coomes L  01/28/2015, 12:15 PM

## 2015-01-29 ENCOUNTER — Other Ambulatory Visit: Payer: Self-pay | Admitting: Orthopedic Surgery

## 2015-01-29 LAB — BASIC METABOLIC PANEL
Anion gap: 7 (ref 5–15)
BUN: 18 mg/dL (ref 6–20)
CALCIUM: 8.4 mg/dL — AB (ref 8.9–10.3)
CO2: 24 mmol/L (ref 22–32)
CREATININE: 0.77 mg/dL (ref 0.44–1.00)
Chloride: 112 mmol/L — ABNORMAL HIGH (ref 101–111)
Glucose, Bld: 138 mg/dL — ABNORMAL HIGH (ref 65–99)
Potassium: 4.9 mmol/L (ref 3.5–5.1)
SODIUM: 143 mmol/L (ref 135–145)

## 2015-01-29 MED ORDER — TRAMADOL HCL 50 MG PO TABS: 50.0000 mg | ORAL_TABLET | Freq: Four times a day (QID) | ORAL | Status: DC | PRN

## 2015-01-29 MED ORDER — FENTANYL CITRATE (PF) 100 MCG/2ML IJ SOLN
50.0000 ug | INTRAMUSCULAR | Status: DC | PRN
  Filled 2015-01-29: qty 2

## 2015-01-29 NOTE — Progress Notes (Signed)
RN took consent for patient to sign, but patient wants to sign the consent in the AM and not today.

## 2015-01-29 NOTE — Progress Notes (Signed)
Pt admitted to [facility] from [place].  Pt alert, and oriented to self, month, place, and birthdate.  She denies pain or discomfort.  Pt understands need to call staff before attempting to get out of bed.  Soft call bell in place.  Bed alarm set.  Will continue to monitor and assess.

## 2015-01-29 NOTE — Progress Notes (Signed)
have discussed surgery with patient for tomorrow   Plan orif left wrist with ctr and closed treatment of right side  Patient agrees to current plan

## 2015-01-29 NOTE — Care Management Important Message (Signed)
Important Message  Patient Details  Name: Allison [name] MRN: [id number] Date of Birth: 06-Dec-1926   Medicare Important Message Given:  Yes    Leelynd Maldonado Abena 01/29/2015, 1:10 PM

## 2015-01-29 NOTE — Progress Notes (Signed)
Trauma Service Note  Subjective: The patient is doing fine.  A bit confused.  Complaining of eye pain and a headache.  No acute distress.  Objective: Vital signs in last 24 hours: Temp:  [97.5 F (36.4 C)-98.6 F (37 C)] 97.5 F (36.4 C) (12/27 0814) Pulse Rate:  [44-78] 44 (12/27 0700) Resp:  [11-24] 14 (12/27 0700) BP: (109-160)/(45-121) 136/50 mmHg (12/27 0700) SpO2:  [93 %-100 %] 95 % (12/27 0700) Last BM Date: 01/25/15  Intake/Output from previous day: 12/26 0701 - 12/27 0700 In: 1022.1 [P.O.:670; I.V.:352.1] Out: 1505 [Urine:1505] Intake/Output this shift:    General: No acute distress.  A bit confused.  Lungs: Clear  Abd: Benign  Extremities: Bilateral wrists in ACE wraps.  Can still use her fingers well.  Neuro: Confused, not agitated, Good acuity OS.  I spoke with [facility] surgeon this AM who thinks the risk of surgery could compromise total vision if there are complications, and partial entrapment could improve with time and swelling going down.  Lab Results: CBC   Recent Labs  01/27/15 0528  WBC 7.3  HGB 12.3  HCT 39.7  PLT 180   BMET  Recent Labs  01/27/15 0528 01/29/15 0235  NA 145 143  K 4.1 4.9  CL 111 112*  CO2 25 24  GLUCOSE 133* 138*  BUN 16 18  CREATININE 0.72 0.77  CALCIUM 8.5* 8.4*   PT/INR No results for input(s): LABPROT, INR in the last 72 hours. ABG No results for input(s): PHART, HCO3 in the last 72 hours.  Invalid input(s): PCO2, PO2  Studies/Results: Ct Head Wo Contrast  01/28/2015  CLINICAL DATA:  Traumatic brain injury. Left-sided headaches. Intraparenchymal hemorrhage. EXAM: CT HEAD WITHOUT CONTRAST TECHNIQUE: Contiguous axial images were obtained from the base of the skull through the vertex without intravenous contrast. COMPARISON:  CT head without contrast 12/ 26/16 at 5:13 a.m. FINDINGS: The left frontal parenchymal and subarachnoid/subdural hemorrhage is not significantly changed from the prior exam. No new  hemorrhage is present. Moderate atrophy and white matter disease is present bilaterally. An arachnoid cyst over the left frontal convexity is again noted. Asymmetric atrophy is present over the convexities. This may reflect some chronic mass effect on the left. IMPRESSION: 1. Stable appearance of the anterior left frontal parenchymal and extra-axial hemorrhage. 2. Stable atrophy and white matter disease. 3. No new hemorrhage. Electronically Signed   By: San Morelle M.D.   On: 01/28/2015 11:40   Ct Head Wo Contrast  01/28/2015  CLINICAL DATA:  Found down at home, follow-up intracranial hemorrhage. EXAM: CT HEAD WITHOUT CONTRAST TECHNIQUE: Contiguous axial images were obtained from the base of the skull through the vertex without intravenous contrast. COMPARISON:  CT head January 26, 2015  and MRI brain April 15, 2014 FINDINGS: Evolving LEFT frontal 2.4 x 2.6 cm intraparenchymal hematoma with surrounding low-density vasogenic edema. Subcentimeter cortical based versus subarachnoid hemorrhage RIGHT parietal lobe/sulci. Small amount of LEFT frontotemporal subarachnoid hemorrhage. Small amount of dependent blood products in the occipital horns. No hydrocephalus. Moderate ventriculomegaly on the basis of global parenchymal brain volume loss. Patchy to confluent supratentorial and pontine white matter hypodensities compatible chronic small vessel ischemic disease. Old LEFT thalamus lacunar infarct. Old small RIGHT cerebellar infarct. 4 mm density along RIGHT falx most compatible with blood products, as prior new from prior MRI. Basal cisterns are patent. Severe calcific atherosclerosis the carotid siphons. Partially imaged LEFT maxillary and sphenoid hemo sinus. The mastoid air cells are well aerated. LEFT periorbital soft  tissue swelling is a hematoma. LEFT occipital small scalp hematoma. No skull fracture. IMPRESSION: Evolving LEFT frontal intraparenchymal hematoma. Subcentimeter RIGHT parietal cortical  hemorrhagic contusion versus subarachnoid hemorrhage. Small amount of LEFT frontotemporal subarachnoid hemorrhage. Re- distributed intraventricular blood products without hydrocephalus. Electronically Signed   By: Elon Alas M.D.   On: 01/28/2015 05:36    Anti-infectives: Anti-infectives    Start     Dose/Rate Route Frequency Ordered Stop   01/28/15 1200  clindamycin (CLEOCIN) capsule 300 mg     300 mg Oral 4 times per day 01/28/15 1119        Assessment/Plan: s/p Procedure(s): OPEN REDUCTION INTERNAL FIXATION (ORIF) LEFT DISTAL RADIUS FRACTURE LEFT CARPAL TUNNEL RELEASE Advance diet PT/OT/ST  Disposition planning Transfer from ICU  LOS: 4 days   Kathryne Eriksson. Dahlia Bailiff, MD, FACS [phone] Trauma Surgeon 01/29/2015

## 2015-01-29 NOTE — Progress Notes (Signed)
Allison [name] is a 81 y.o. female patient that sustained a ground level fall at home on 01/25/2015 caused by syncope and sustained a left zygoma fracture, left inferior orbital wall fracture, and left maxillary sinus fracture.  Problems: Active Problems:   Frontal lobe contusion (HCC)   Vitals: BP 136/50 mmHg  Pulse 44  Temp(Src) 97.7 F (36.5 C) (Oral)  Resp 14  Ht 5\' 1"  (1.549 m)  Wt 60.1 kg (132 lb 7.9 oz)  BMI 25.05 kg/m2  SpO2 95%   Exam: Left periorbital edema and ecchymosis is slowly improving, suture in the left brow is clean and intact without infection.  EOMI only with slightly decreased supraduction in the left eye.  The patient reports that she is able to see appropriately from the eye.    A/P: The patient is s/p fall with non-operative left zygoma, left inferior orbital wall fracture, and left maxillary sinus fracture. 1. The repeat optho examination (courtesy Dr. Maylene Roes) reveals that the patient's vision in the left eye is stable.  She is slightly limited on supraduction; however, her visual acuity appears to be intact.   2. I recommend wound care on the left brow laceration that was closed by the emergency department.   3. Follow up in my office once discharged.  As discussed in the previous note we will continue to monitor the left eye; however, the fracture still remains non-operative at this time.  Sultan,Jamesha Ellsworth L 01/29/2015, 7:31 AM

## 2015-01-30 ENCOUNTER — Inpatient Hospital Stay (HOSPITAL_COMMUNITY): Payer: Medicare Other | Admitting: Certified Registered Nurse Anesthetist

## 2015-01-30 ENCOUNTER — Other Ambulatory Visit: Payer: Medicare Other

## 2015-01-30 ENCOUNTER — Encounter (HOSPITAL_COMMUNITY): Admission: EM | Disposition: A | Discharge status: Home / Self Care | Payer: Self-pay | Source: Home / Self Care

## 2015-01-30 DIAGNOSIS — S52572A Other intraarticular fracture of lower end of left radius, initial encounter for closed fracture: Secondary | ICD-10-CM | POA: Diagnosis not present

## 2015-01-30 DIAGNOSIS — S52551A Other extraarticular fracture of lower end of right radius, initial encounter for closed fracture: Secondary | ICD-10-CM | POA: Diagnosis not present

## 2015-01-30 DIAGNOSIS — W19XXXA Unspecified fall, initial encounter: Secondary | ICD-10-CM | POA: Diagnosis present

## 2015-01-30 DIAGNOSIS — S62101A Fracture of unspecified carpal bone, right wrist, initial encounter for closed fracture: Secondary | ICD-10-CM | POA: Diagnosis present

## 2015-01-30 DIAGNOSIS — S0292XA Unspecified fracture of facial bones, initial encounter for closed fracture: Secondary | ICD-10-CM | POA: Diagnosis present

## 2015-01-30 DIAGNOSIS — S069XAA Unspecified intracranial injury with loss of consciousness status unknown, initial encounter: Secondary | ICD-10-CM | POA: Diagnosis present

## 2015-01-30 DIAGNOSIS — S62102A Fracture of unspecified carpal bone, left wrist, initial encounter for closed fracture: Secondary | ICD-10-CM

## 2015-01-30 DIAGNOSIS — S069X9A Unspecified intracranial injury with loss of consciousness of unspecified duration, initial encounter: Secondary | ICD-10-CM | POA: Diagnosis present

## 2015-01-30 DIAGNOSIS — S6412XA Injury of median nerve at wrist and hand level of left arm, initial encounter: Secondary | ICD-10-CM | POA: Diagnosis not present

## 2015-01-30 DIAGNOSIS — S0181XA Laceration without foreign body of other part of head, initial encounter: Secondary | ICD-10-CM | POA: Diagnosis present

## 2015-01-30 HISTORY — PX: CLOSED REDUCTION RADIAL SHAFT: SHX5008

## 2015-01-30 HISTORY — PX: OPEN REDUCTION INTERNAL FIXATION (ORIF) DISTAL RADIAL FRACTURE: SHX5989

## 2015-01-30 HISTORY — PX: CARPAL TUNNEL RELEASE: SHX101

## 2015-01-30 SURGERY — OPEN REDUCTION INTERNAL FIXATION (ORIF) DISTAL RADIUS FRACTURE
Anesthesia: Monitor Anesthesia Care | Site: Wrist | Laterality: Right

## 2015-01-30 MED ORDER — BUPIVACAINE-EPINEPHRINE (PF) 0.5% -1:200000 IJ SOLN
INTRAMUSCULAR | Status: DC | PRN
  Administered 2015-01-30: 30 mL via PERINEURAL

## 2015-01-30 MED ORDER — TRAMADOL HCL 50 MG PO TABS: 50.0000 mg | ORAL_TABLET | Freq: Four times a day (QID) | ORAL | Status: DC | PRN

## 2015-01-30 MED ORDER — LACTATED RINGERS IV SOLN
INTRAVENOUS | Status: DC | PRN
  Administered 2015-01-30: 12:00:00 via INTRAVENOUS

## 2015-01-30 MED ORDER — BUPIVACAINE HCL (PF) 0.25 % IJ SOLN
INTRAMUSCULAR | Status: DC | PRN
  Administered 2015-01-30: 30 mL

## 2015-01-30 MED ORDER — VANCOMYCIN HCL IN DEXTROSE 1-5 GM/200ML-% IV SOLN
1000.0000 mg | INTRAVENOUS | Status: AC
  Administered 2015-01-30 (×2): 1000 mg via INTRAVENOUS
  Filled 2015-01-30 (×2): qty 200

## 2015-01-30 MED ORDER — PROPOFOL 10 MG/ML IV BOLUS
INTRAVENOUS | Status: AC
  Filled 2015-01-30: qty 20

## 2015-01-30 MED ORDER — MIDAZOLAM HCL 2 MG/2ML IJ SOLN: 2.0000 mg | Freq: Once | INTRAMUSCULAR | Status: DC

## 2015-01-30 MED ORDER — PROPOFOL 10 MG/ML IV BOLUS: INTRAVENOUS | Status: DC | PRN

## 2015-01-30 MED ORDER — LEVETIRACETAM 500 MG PO TABS: 500.0000 mg | ORAL_TABLET | Freq: Two times a day (BID) | ORAL | Status: AC

## 2015-01-30 MED ORDER — MIDAZOLAM HCL 2 MG/2ML IJ SOLN
INTRAMUSCULAR | Status: DC
Start: 2015-01-30 — End: 2015-01-30
  Filled 2015-01-30: qty 2

## 2015-01-30 MED ORDER — 0.9 % SODIUM CHLORIDE (POUR BTL) OPTIME
TOPICAL | Status: DC | PRN
  Administered 2015-01-30: 1000 mL

## 2015-01-30 MED ORDER — LACTATED RINGERS IV SOLN
INTRAVENOUS | Status: DC
  Administered 2015-01-30: 11:00:00 via INTRAVENOUS

## 2015-01-30 MED ORDER — CHLORHEXIDINE GLUCONATE 4 % EX LIQD
60.0000 mL | Freq: Once | CUTANEOUS | Status: AC
  Administered 2015-01-30: 4 via TOPICAL
  Filled 2015-01-30: qty 60

## 2015-01-30 MED ORDER — ONDANSETRON HCL 4 MG/2ML IJ SOLN
INTRAMUSCULAR | Status: DC | PRN
  Administered 2015-01-30: 4 mg via INTRAVENOUS

## 2015-01-30 MED ORDER — BUPIVACAINE HCL (PF) 0.25 % IJ SOLN
INTRAMUSCULAR | Status: AC
  Filled 2015-01-30: qty 30

## 2015-01-30 MED ORDER — CLINDAMYCIN HCL 300 MG PO CAPS: 300.0000 mg | ORAL_CAPSULE | Freq: Four times a day (QID) | ORAL | Status: DC

## 2015-01-30 MED ORDER — PROPOFOL 500 MG/50ML IV EMUL
INTRAVENOUS | Status: DC | PRN
  Administered 2015-01-30: 15 ug/kg/min via INTRAVENOUS

## 2015-01-30 MED ORDER — FENTANYL CITRATE (PF) 250 MCG/5ML IJ SOLN
INTRAMUSCULAR | Status: AC
  Filled 2015-01-30: qty 5

## 2015-01-30 SURGICAL SUPPLY — 70 items
APL SKNCLS STERI-STRIP NONHPOA (GAUZE/BANDAGES/DRESSINGS) ×2
BANDAGE ACE 4X5 VEL STRL LF (GAUZE/BANDAGES/DRESSINGS) ×2 IMPLANT
BANDAGE ELASTIC 3 VELCRO ST LF (GAUZE/BANDAGES/DRESSINGS) ×2 IMPLANT
BANDAGE ELASTIC 4 VELCRO ST LF (GAUZE/BANDAGES/DRESSINGS) ×2 IMPLANT
BENZOIN TINCTURE PRP APPL 2/3 (GAUZE/BANDAGES/DRESSINGS) ×2 IMPLANT
BIT DRILL 2 FAST STEP (BIT) ×2 IMPLANT
BIT DRILL 2.5X4 QC (BIT) ×2 IMPLANT
BNDG CMPR 9X4 STRL LF SNTH (GAUZE/BANDAGES/DRESSINGS) ×2
BNDG ESMARK 4X9 LF (GAUZE/BANDAGES/DRESSINGS) ×4 IMPLANT
BNDG GAUZE ELAST 4 BULKY (GAUZE/BANDAGES/DRESSINGS) ×6 IMPLANT
CLOSURE WOUND 1/2 X4 (GAUZE/BANDAGES/DRESSINGS) ×1
CORDS BIPOLAR (ELECTRODE) ×4 IMPLANT
COVER MAYO STAND STRL (DRAPES) IMPLANT
COVER SURGICAL LIGHT HANDLE (MISCELLANEOUS) ×4 IMPLANT
CUFF TOURNIQUET SINGLE 18IN (TOURNIQUET CUFF) ×4 IMPLANT
CUFF TOURNIQUET SINGLE 24IN (TOURNIQUET CUFF) IMPLANT
DRAPE OEC MINIVIEW 54X84 (DRAPES) ×2 IMPLANT
DRAPE SURG 17X23 STRL (DRAPES) ×4 IMPLANT
DURAPREP 26ML APPLICATOR (WOUND CARE) ×2 IMPLANT
ELECT REM PT RETURN 9FT ADLT (ELECTROSURGICAL)
ELECTRODE REM PT RTRN 9FT ADLT (ELECTROSURGICAL) IMPLANT
GAUZE SPONGE 4X4 12PLY STRL (GAUZE/BANDAGES/DRESSINGS) ×2 IMPLANT
GAUZE XEROFORM 1X8 LF (GAUZE/BANDAGES/DRESSINGS) ×2 IMPLANT
GLOVE BIOGEL PI IND STRL 7.0 (GLOVE) IMPLANT
GLOVE BIOGEL PI INDICATOR 7.0 (GLOVE) ×4
GLOVE SURG SS PI 6.5 STRL IVOR (GLOVE) ×4 IMPLANT
GLOVE SURG SYN 8.0 (GLOVE) ×4 IMPLANT
GLOVE SURG SYN 8.0 PF PI (GLOVE) ×2 IMPLANT
GOWN STRL REUS W/ TWL LRG LVL3 (GOWN DISPOSABLE) ×2 IMPLANT
GOWN STRL REUS W/ TWL XL LVL3 (GOWN DISPOSABLE) ×2 IMPLANT
GOWN STRL REUS W/TWL LRG LVL3 (GOWN DISPOSABLE) ×8
GOWN STRL REUS W/TWL XL LVL3 (GOWN DISPOSABLE) ×4
KIT BASIN OR (CUSTOM PROCEDURE TRAY) ×4 IMPLANT
KIT ROOM TURNOVER OR (KITS) ×4 IMPLANT
NS IRRIG 1000ML POUR BTL (IV SOLUTION) ×4 IMPLANT
PACK ORTHO EXTREMITY (CUSTOM PROCEDURE TRAY) ×4 IMPLANT
PAD ARMBOARD 7.5X6 YLW CONV (MISCELLANEOUS) ×6 IMPLANT
PAD CAST 3X4 CTTN HI CHSV (CAST SUPPLIES) ×2 IMPLANT
PAD CAST 4YDX4 CTTN HI CHSV (CAST SUPPLIES) ×2 IMPLANT
PADDING CAST ABS 4INX4YD NS (CAST SUPPLIES) ×2
PADDING CAST ABS COTTON 4X4 ST (CAST SUPPLIES) IMPLANT
PADDING CAST COTTON 3X4 STRL (CAST SUPPLIES)
PADDING CAST COTTON 4X4 STRL (CAST SUPPLIES) ×4
PEG SUBCHONDRAL SMOOTH 2.0X18 (Peg) ×4 IMPLANT
PEG SUBCHONDRAL SMOOTH 2.0X20 (Peg) ×8 IMPLANT
PEG SUBCHONDRAL SMOOTH 2.0X22 (Peg) ×2 IMPLANT
PENCIL BUTTON HOLSTER BLD 10FT (ELECTRODE) IMPLANT
PLATE STAN 24.4X59.5 LT (Plate) ×2 IMPLANT
RUBBERBAND STERILE (MISCELLANEOUS) ×2 IMPLANT
SCREW BN 12X3.5XNS CORT TI (Screw) IMPLANT
SCREW CORT 3.5X10 LNG (Screw) ×4 IMPLANT
SCREW CORT 3.5X12 (Screw) ×4 IMPLANT
SPLINT PLASTER EXTRA FAST 3X15 (CAST SUPPLIES) ×4
SPLINT PLASTER GYPS XFAST 3X15 (CAST SUPPLIES) IMPLANT
SPONGE GAUZE 4X4 12PLY STER LF (GAUZE/BANDAGES/DRESSINGS) ×2 IMPLANT
SPONGE LAP 4X18 X RAY DECT (DISPOSABLE) IMPLANT
SPONGE SCRUB IODOPHOR (GAUZE/BANDAGES/DRESSINGS) ×2 IMPLANT
STRIP CLOSURE SKIN 1/2X4 (GAUZE/BANDAGES/DRESSINGS) ×3 IMPLANT
SUT ETHILON 4 0 PS 2 18 (SUTURE) IMPLANT
SUT PROLENE 3 0 PS 2 (SUTURE) ×2 IMPLANT
SUT VIC AB 3-0 FS2 27 (SUTURE) IMPLANT
SUT VIC AB 3-0 PS2 18 (SUTURE)
SUT VIC AB 3-0 PS2 18XBRD (SUTURE) IMPLANT
SUT VICRYL 4-0 PS2 18IN ABS (SUTURE) ×2 IMPLANT
SYR CONTROL 10ML LL (SYRINGE) IMPLANT
TOWEL OR 17X24 6PK STRL BLUE (TOWEL DISPOSABLE) ×4 IMPLANT
TOWEL OR 17X26 10 PK STRL BLUE (TOWEL DISPOSABLE) ×4 IMPLANT
TUBE CONNECTING 12'X1/4 (SUCTIONS)
TUBE CONNECTING 12X1/4 (SUCTIONS) IMPLANT
UNDERPAD 30X30 INCONTINENT (UNDERPADS AND DIAPERS) ×4 IMPLANT

## 2015-01-30 NOTE — Progress Notes (Signed)
Patient underwent ORIF left distal radius and closed treatment of right side with splinting  Will need to see in my office 02/05/15

## 2015-01-30 NOTE — Discharge Instructions (Signed)
Wash wounds daily in shower with soap and water. Do not soak. Apply antibiotic ointment (e.g. Neosporin) twice daily and as needed to keep moist. Cover with dry dressing.  

## 2015-01-30 NOTE — Anesthesia Procedure Notes (Addendum)
Anesthesia Regional Block:  Axillary brachial plexus block  Pre-Anesthetic Checklist: ,, timeout performed, Correct Patient, Correct Site, Correct Laterality, Correct Procedure, Correct Position, site marked, Risks and benefits discussed, Surgical consent,  Pre-op evaluation,  Post-op pain management  Laterality: Left  Prep: chloraprep       Needles:  Injection technique: Single-shot  Needle Type: Stimulator Needle - 40     Needle Length: 4cm 4 cm Needle Gauge: 22 and 22 G    Additional Needles:  Procedures: ultrasound guided (picture in chart) Axillary brachial plexus block Narrative:  Injection made incrementally with aspirations every 5 mL. Anesthesiologist: Nolon Nations  Additional Notes: BP cuff, EKG monitors applied. Sedation begun. Nerve location verified with U/S. Anesthetic injected incrementally, slowly , and after neg aspirations under direct u/s guidance. Good perineural spread. Tolerated well.   Procedure Name: MAC Date/Time: 01/30/2015 11:58 AM Performed by: Tressia Miners LEFFEW Pre-anesthesia Checklist: Patient identified, Emergency Drugs available, Suction available, Timeout performed and Patient being monitored Patient Re-evaluated:Patient Re-evaluated prior to inductionOxygen Delivery Method: Simple face mask Placement Confirmation: positive ETCO2

## 2015-01-30 NOTE — Discharge Summary (Signed)
Physician Discharge Summary  Patient ID: Allison [name] MRN: [id number] DOB/AGE: 79-Jul-1928 79 y.o.  Admit date: 01/25/2015 Discharge date: 01/30/2015  Discharge Diagnoses Patient Active Problem List   Diagnosis Date Noted  . Fall 01/30/2015  . TBI (traumatic brain injury) ([place]) 01/30/2015  . Multiple facial fractures ([place]) 01/30/2015  . Wrist fracture, bilateral 01/30/2015  . Facial laceration 01/30/2015  . Frontal lobe contusion ([place]) 01/25/2015  . Palpitations   . Cerebral infarction due to unspecified mechanism   . TIA (transient ischemic attack) 04/15/2014  . Weakness 04/14/2014  . CVA (cerebral infarction) 04/14/2014  . Dizziness and giddiness   . Dysuria 10/08/2011  . HTN (hypertension) 10/07/2011  . Embolic stroke ([place]) [id number]  . acute Blindness  right eye 10/07/2011  . Hyperlipemia 10/07/2011  . Depression 10/07/2011  . PUD (peptic ulcer disease) 10/07/2011  . Vasculopathy 10/07/2011  . Tobacco abuse 10/07/2011  . Retinal artery branch occlusion of right eye 10/07/2011  . Internal hemorrhoids   . Cholelithiasis     Consultants Dr. Charlotte Crumb for hand surgery  Dr. Jovita Gamma for neurosurgery  Dr. Tamela Oddi for OMF  Dr. Jeffie Pollock for ophthalmology   Procedures 12/23 -- Closure of eyelid laceration by Dr. Quintella Reichert  12/28 -- ORIF of left distal radius fracture and closed treatment of right wrist fracture with splinting by Dr. Burney Gauze   HPI: Allison [name] presented after a fall in the bathroom at home.There was a questionable loss of consciousness.She was amnestic for the exact circumstances of the fall.She was not a trauma activation. She was evaluated by the EDP and had the multiple injuries listed above.Her laceration was closed in the ED. Hand surgery, neurosurgery, and OMF were consulted and she was admitted by the trauma service.   Hospital Course: Neurosurgery recommended nonoperative treatment with a repeat head CT the  following day. This was stable. Ophthalmology was also consulted and she was noted to have a possible inferior rectus entrapment. OMF opted for initial nonoperative treatment however given her decent movement on clinical exam. Hand surgery suggested closed reduction or operative fixation and delayed treatment in order to coordinate with OMF. When it became evident that OMF was not going to perform surgery she was scheduled and had the second listed procedure done. She had waxing and waning mental status while hospitalized. Neurosurgery was concerned this may be related to seizure activity and she was started on an antiepileptic. The family wished to take the patient home to care for her after surgery and she was discharged there in good condition.     Medication List    STOP taking these medications        clopidogrel 75 MG tablet  Commonly known as:  PLAVIX      TAKE these medications        amLODipine 5 MG tablet  Commonly known as:  NORVASC  Take 5 mg by mouth every morning.     clindamycin 300 MG capsule  Commonly known as:  CLEOCIN  Take 1 capsule (300 mg total) by mouth every 6 (six) hours.     Co Q-10 50 MG Caps  Take 1 capsule by mouth every morning. Pt states takes 30 mgs     cycloSPORINE 0.05 % ophthalmic emulsion  Commonly known as:  RESTASIS  Place 1 drop into the left eye 2 (two) times daily.     diazepam 2 MG tablet  Commonly known as:  VALIUM  Take 1-2 mg by mouth every 12 (twelve)  hours as needed for anxiety.     escitalopram 20 MG tablet  Commonly known as:  LEXAPRO  Take 20 mg by mouth daily.     ezetimibe 10 MG tablet  Commonly known as:  ZETIA  Take 5 mg by mouth at bedtime.     levETIRAcetam 500 MG tablet  Commonly known as:  KEPPRA  Take 1 tablet (500 mg total) by mouth 2 (two) times daily.     OLANZapine 10 MG tablet  Commonly known as:  ZYPREXA  Take 10 mg by mouth daily.     pantoprazole 40 MG tablet  Commonly known as:  PROTONIX  Take 40 mg  by mouth every morning.     rosuvastatin 10 MG tablet  Commonly known as:  CRESTOR  Take 10 mg by mouth at bedtime.     SYSTANE FREE OP  Place 1 drop into the right eye daily as needed (dry eyes).     traMADol 50 MG tablet  Commonly known as:  ULTRAM  Take 1-2 tablets (50-100 mg total) by mouth every 6 (six) hours as needed (pain).     VITAMIN D PO  Take 1 tablet by mouth every morning.            Follow-up Information    Follow up with Schuyler Amor, MD On 02/05/2015.   Specialty:  Orthopedic Surgery   Why:  For wound re-check   Contact information:   [address] [place] [address] [phone]       Schedule an appointment as soon as possible for a visit with DeCordova,CHRISTOPHER L, DDS.   Specialty:  Oral Surgery   Contact information:   [place] [address] [phone]       Schedule an appointment as soon as possible for a visit with Hosie Spangle, MD.   Specialty:  Neurosurgery   Contact information:   [address] [address] [phone]       Call [facility].   Why:  As needed   Contact information:   [address] [id number] [place] [phone]       Signed: Lisette Abu, PA-C Pager: [phone] General Trauma PA Pager: [phone] 01/30/2015, 2:11 PM

## 2015-01-30 NOTE — Progress Notes (Signed)
Trauma Service Note  Subjective: Patient seems more oriented this AM.  Does not remember having a conversation about surgery with the Hand surgeon yesterday.  Says he just told her he was going to do surgery.  Apparently has not signed the consent.  Objective: Vital signs in last 24 hours: Temp:  [97.4 F (36.3 C)-98.6 F (37 C)] 98 F (36.7 C) (12/28 0504) Pulse Rate:  [50-60] 52 (12/28 0504) Resp:  [15-18] 18 (12/28 0504) BP: (142-175)/(43-69) 170/69 mmHg (12/28 0504) SpO2:  [96 %-99 %] 96 % (12/28 0504) Last BM Date: 01/25/15  Intake/Output from previous day: 12/27 0701 - 12/28 0700 In: 120 [P.O.:120] Out: 650 [Urine:650] Intake/Output this shift:    General: No acute distress  Lungs: Clear  Abd: Benign.  Has been NPO for surgery.    Extremities: Wrists in splints.  Neuro: Intact.  More oriented to person and place and events today.  Lab Results: CBC  No results for input(s): WBC, HGB, HCT, PLT in the last 72 hours. BMET  Recent Labs  01/29/15 0235  NA 143  K 4.9  CL 112*  CO2 24  GLUCOSE 138*  BUN 18  CREATININE 0.77  CALCIUM 8.4*   PT/INR No results for input(s): LABPROT, INR in the last 72 hours. ABG No results for input(s): PHART, HCO3 in the last 72 hours.  Invalid input(s): PCO2, PO2  Studies/Results: Ct Head Wo Contrast  01/28/2015  CLINICAL DATA:  Traumatic brain injury. Left-sided headaches. Intraparenchymal hemorrhage. EXAM: CT HEAD WITHOUT CONTRAST TECHNIQUE: Contiguous axial images were obtained from the base of the skull through the vertex without intravenous contrast. COMPARISON:  CT head without contrast 12/ 26/16 at 5:13 a.m. FINDINGS: The left frontal parenchymal and subarachnoid/subdural hemorrhage is not significantly changed from the prior exam. No new hemorrhage is present. Moderate atrophy and white matter disease is present bilaterally. An arachnoid cyst over the left frontal convexity is again noted. Asymmetric atrophy is present  over the convexities. This may reflect some chronic mass effect on the left. IMPRESSION: 1. Stable appearance of the anterior left frontal parenchymal and extra-axial hemorrhage. 2. Stable atrophy and white matter disease. 3. No new hemorrhage. Electronically Signed   By: [place] Morelle M.D.   On: 01/28/2015 11:40    Anti-infectives: Anti-infectives    Start     Dose/Rate Route Frequency Ordered Stop   01/30/15 1145  vancomycin (VANCOCIN) IVPB 1000 mg/200 mL premix     1,000 mg 200 mL/hr over 60 Minutes Intravenous To [facility] 01/30/15 0253 01/31/15 1145   01/28/15 1200  clindamycin (CLEOCIN) capsule 300 mg     300 mg Oral 4 times per day 01/28/15 1119        Assessment/Plan: s/p Procedure(s): OPEN REDUCTION INTERNAL FIXATION (ORIF) LEFT DISTAL RADIUS FRACTURE LEFT CARPAL TUNNEL RELEASE For surgery today.  Disposition after surgery is questionable.  LOS: 5 days   Kathryne Eriksson. Dahlia Bailiff, MD, FACS [phone] Trauma Surgeon 01/30/2015

## 2015-01-30 NOTE — H&P (View-Only) (Signed)
Reason for Consult:bilateral distal radius fractures Referring Physician: [place]  Allison [name] is an 81 y.o. female.  HPI: s/p fall with bilateral distal radius fractures left grearter than right  Past Medical History  Diagnosis Date  . HTN (hypertension)   . Aortic aneurysm ([place])   . Transient ischemic attack   . Murmur, cardiac   . Cholelithiasis   . Internal hemorrhoids   . Irritable bowel syndrome   . Depression   . Eustachian tube dysfunction     pt denies  . Appendicitis   . CVA (cerebral infarction)     Right retinal branch occlusion  . Carotid artery plaque     hx of per left side   . Stroke ([place])   . Legally blind in right eye, as defined in [place]     related to past CVA  . Anxiety   . Chronic kidney disease     hx of kidney stones  . GERD (gastroesophageal reflux disease)   . Allergy   . Arthritis   . Cataract   . Clotting disorder ([place])   . Osteoporosis     Past Surgical History  Procedure Laterality Date  . Abdominal aortic aneurysm repair  2005  . Kidney stones removed    . Appendectomy    . Abdominal hysterectomy    . Cholecystectomy    . Shoulder surgery      Left  . Wrist surgery      Right  . Tee without cardioversion  12/18/2011    Procedure: TRANSESOPHAGEAL ECHOCARDIOGRAM (TEE);  Surgeon: Larey Dresser, MD;  Location: [facility];  Service: Cardiovascular;  Laterality: N/A;  . Eye surgery      hx of lazy eye     Family History  Problem Relation Age of Onset  . Asthma Brother     died age 34  . Diabetes Mother   . Coronary artery disease Mother     died age 57  . Coronary artery disease Father     died age 79  . Coronary artery disease Brother     died age 22  . Coronary artery disease Sister     died age 23    Social History:  reports that she quit smoking about 9 months ago. Her smoking use included Cigarettes. She has a 10 pack-year smoking history. She has never used smokeless tobacco. She reports that she does not drink  alcohol or use illicit drugs.  Allergies:  Allergies  Allergen Reactions  . Shellfish Allergy Shortness Of Breath and Rash    All seafood  . Xylocaine [Lidocaine] Shortness Of Breath  . Biaxin [Clarithromycin]     unknown  . Celebrex [Celecoxib]     Unknown   . Cozaar [Losartan]     High blood pressure  . Demerol [Meperidine]     unknown  . Elavil [Amitriptyline]     unknown  . Fosamax [Alendronate]     unknown  . Hydromorphone     Hives   . Inderal [Propranolol]     unknown  . Ivp Dye [Iodinated Diagnostic Agents]     rash  . Levaquin [Levofloxacin]     unknown  . Macrodantin [Nitrofurantoin]     unknown  . Mycinette [Phenol]     Unknown   . Neggram [Nalidixic Acid]     unknown  . Novocain [Procaine]     unknown  . Pamelor [Nortriptyline]     unknown  . Penicillins  rash  . Robaxin [Methocarbamol]     unknown  . Tamiflu [Oseltamivir]     Unknown   . Tenormin [Atenolol]     unknown  . Triavil [Perphenazine-Amitriptyline]     Unknown   . Ampicillin Rash  . Paxil [Paroxetine] Rash  . Wellbutrin [Bupropion] Rash  . Zoloft [Sertraline] Rash    Medications:  Scheduled: . amLODipine  5 mg Oral q morning - 10a  . antiseptic oral rinse  7 mL Mouth Rinse q12n4p  . chlorhexidine  15 mL Mouth Rinse BID  . dexamethasone  8 mg Intravenous TID  . escitalopram  20 mg Oral Daily  . ezetimibe  5 mg Oral QHS  . OLANZapine  10 mg Oral Daily  . pantoprazole  40 mg Oral q morning - 10a    Results for orders placed or performed during the hospital encounter of 01/25/15 (from the past 48 hour(s))  Basic metabolic panel     Status: Abnormal   Collection Time: 01/25/15 11:04 PM  Result Value Ref Range   Sodium 143 135 - 145 mmol/L   Potassium 3.8 3.5 - 5.1 mmol/L   Chloride 107 101 - 111 mmol/L   CO2 27 22 - 32 mmol/L   Glucose, Bld 133 (H) 65 - 99 mg/dL   BUN 15 6 - 20 mg/dL   Creatinine, Ser 0.95 0.44 - 1.00 mg/dL   Calcium 9.5 8.9 - 10.3 mg/dL   GFR calc  non Af Amer 53 (L) >60 mL/min   GFR calc Af Amer >60 >60 mL/min    Comment: (NOTE) The eGFR has been calculated using the CKD EPI equation. This calculation has not been validated in all clinical situations. eGFR's persistently <60 mL/min signify possible Chronic Kidney Disease.    Anion gap 9 5 - 15  CBC with Differential     Status: Abnormal   Collection Time: 01/25/15 11:04 PM  Result Value Ref Range   WBC 11.9 (H) 4.0 - 10.5 K/uL   RBC 4.42 3.87 - 5.11 MIL/uL   Hemoglobin 13.1 12.0 - 15.0 g/dL   HCT 40.1 36.0 - 46.0 %   MCV 90.7 78.0 - 100.0 fL   MCH 29.6 26.0 - 34.0 pg   MCHC 32.7 30.0 - 36.0 g/dL   RDW 13.8 11.5 - 15.5 %   Platelets 169 150 - 400 K/uL   Neutrophils Relative % 83 %   Neutro Abs 9.8 (H) 1.7 - 7.7 K/uL   Lymphocytes Relative 7 %   Lymphs Abs 0.9 0.7 - 4.0 K/uL   Monocytes Relative 9 %   Monocytes Absolute 1.1 (H) 0.1 - 1.0 K/uL   Eosinophils Relative 1 %   Eosinophils Absolute 0.1 0.0 - 0.7 K/uL   Basophils Relative 0 %   Basophils Absolute 0.0 0.0 - 0.1 K/uL  Troponin I     Status: None   Collection Time: 01/25/15 11:16 PM  Result Value Ref Range   Troponin I <0.03 <0.031 ng/mL    Comment:        NO INDICATION OF MYOCARDIAL INJURY.   MRSA PCR Screening     Status: None   Collection Time: 01/26/15  2:02 AM  Result Value Ref Range   MRSA by PCR NEGATIVE NEGATIVE    Comment:        The GeneXpert MRSA Assay (FDA approved for NASAL specimens only), is one component of a comprehensive MRSA colonization surveillance program. It is not intended to diagnose MRSA infection nor to guide  or monitor treatment for MRSA infections.   Urinalysis, Routine w reflex microscopic (not at [facility])     Status: Abnormal   Collection Time: 01/26/15  4:39 AM  Result Value Ref Range   Color, Urine YELLOW YELLOW   APPearance CLOUDY (A) CLEAR   Specific Gravity, Urine 1.016 1.005 - 1.030   pH 7.0 5.0 - 8.0   Glucose, UA NEGATIVE NEGATIVE mg/dL   Hgb urine dipstick  NEGATIVE NEGATIVE   Bilirubin Urine NEGATIVE NEGATIVE   Ketones, ur 15 (A) NEGATIVE mg/dL   Protein, ur 30 (A) NEGATIVE mg/dL   Nitrite NEGATIVE NEGATIVE   Leukocytes, UA SMALL (A) NEGATIVE  Urine microscopic-add on     Status: Abnormal   Collection Time: 01/26/15  4:39 AM  Result Value Ref Range   Squamous Epithelial / LPF 0-5 (A) NONE SEEN   WBC, UA 0-5 0 - 5 WBC/hpf   RBC / HPF NONE SEEN 0 - 5 RBC/hpf   Bacteria, UA FEW (A) NONE SEEN   Casts HYALINE CASTS (A) NEGATIVE  CBC     Status: None   Collection Time: 01/26/15  6:16 AM  Result Value Ref Range   WBC 9.3 4.0 - 10.5 K/uL   RBC 4.45 3.87 - 5.11 MIL/uL   Hemoglobin 13.2 12.0 - 15.0 g/dL   HCT 40.8 36.0 - 46.0 %   MCV 91.7 78.0 - 100.0 fL   MCH 29.7 26.0 - 34.0 pg   MCHC 32.4 30.0 - 36.0 g/dL   RDW 13.7 11.5 - 15.5 %   Platelets 169 150 - 400 K/uL  Protime-INR     Status: None   Collection Time: 01/26/15  6:16 AM  Result Value Ref Range   Prothrombin Time 15.1 11.6 - 15.2 seconds   INR 1.17 0.00 - 0.51  Basic metabolic panel     Status: Abnormal   Collection Time: 01/26/15  6:16 AM  Result Value Ref Range   Sodium 145 135 - 145 mmol/L   Potassium 4.0 3.5 - 5.1 mmol/L   Chloride 108 101 - 111 mmol/L   CO2 29 22 - 32 mmol/L   Glucose, Bld 132 (H) 65 - 99 mg/dL   BUN 13 6 - 20 mg/dL   Creatinine, Ser 0.96 0.44 - 1.00 mg/dL   Calcium 9.2 8.9 - 10.3 mg/dL   GFR calc non Af Amer 52 (L) >60 mL/min   GFR calc Af Amer >60 >60 mL/min    Comment: (NOTE) The eGFR has been calculated using the CKD EPI equation. This calculation has not been validated in all clinical situations. eGFR's persistently <60 mL/min signify possible Chronic Kidney Disease.    Anion gap 8 5 - 15    Dg Chest 1 View  01/25/2015  CLINICAL DATA:  Status post fall from standing, with concern for chest injury. Initial encounter. EXAM: CHEST 1 VIEW COMPARISON:  Chest radiograph performed 01/29/2014 FINDINGS: The lungs are well-aerated and clear. There  is no evidence of focal opacification, pleural effusion or pneumothorax. The cardiomediastinal silhouette is borderline normal in size. No acute osseous abnormalities are seen. IMPRESSION: No acute cardiopulmonary process seen. No displaced rib fractures identified. Electronically Signed   By: Garald Balding M.D.   On: 01/25/2015 22:38   Dg Wrist Complete Left  01/25/2015  CLINICAL DATA:  Fall today.  Wrist pain, swelling and deformity. EXAM: LEFT WRIST - COMPLETE 3+ VIEW COMPARISON:  None. FINDINGS: There are acute fractures of the distal radius and ulna. The radial fracture  demonstrates significant posterior displacement and apex anterior angulation. Intra-articular extension of this fracture is difficult to exclude. The fracture does extend into the distal radioulnar joint which is mildly widened. There is a fracture of the ulnar styloid. The carpal bones appear intact and still wall articulate with the distal radius on the lateral view. IMPRESSION: Impacted and posteriorly displaced fractures of the distal radius, most consistent with a Colles fracture. Involvement of the distal articular surface of the radius is difficult to completely exclude. Ulnar styloid fracture. Electronically Signed   By: Richardean Sale M.D.   On: 01/25/2015 22:38   Dg Wrist Complete Right  01/25/2015  CLINICAL DATA:  Status post fall from standing, with right wrist swelling. Initial encounter. EXAM: RIGHT WRIST - COMPLETE 3+ VIEW COMPARISON:  None. FINDINGS: There is a mildly comminuted fracture involving the distal radial metaphysis, with suggestion of extension to the radiocarpal joint. Minimal dorsal tilt is suggested. The carpal rows are intact, and demonstrate normal alignment. Soft tissue swelling is noted about the fracture sites. IMPRESSION: Mildly comminuted fracture involving the distal radial metaphysis, with suggestion of extension to the radiocarpal joint. Minimal dorsal tilt suggested, without significant  displacement. Electronically Signed   By: Garald Balding M.D.   On: 01/25/2015 22:37   Ct Head Wo Contrast  01/25/2015  ADDENDUM REPORT: 01/25/2015 23:06 ADDENDUM: Please note there is a small left frontal hemorrhage likely combination of a small subdural and subarachnoid. Left frontal cortical contusion also noted. A 5 mm round focus over the right parietal convexity (series 201, image 22) may also represent focal cortical contusion. Left occipital scalp hematoma. These results were called by telephone at the time of interpretation on 01/25/2015 at 11:06 pm to Dr. Sherian Maroon , who verbally acknowledged these results. Electronically Signed   By: Anner Crete M.D.   On: 01/25/2015 23:06  01/25/2015  CLINICAL DATA:  81 year old female with fall and left leg pain. EXAM: CT HEAD WITHOUT CONTRAST CT MAXILLOFACIAL WITHOUT CONTRAST CT CERVICAL SPINE WITHOUT CONTRAST TECHNIQUE: Multidetector CT imaging of the head, cervical spine, and maxillofacial structures were performed using the standard protocol without intravenous contrast. Multiplanar CT image reconstructions of the cervical spine and maxillofacial structures were also generated. COMPARISON:  Head CT dated 04/14/2014 FINDINGS: CT HEAD FINDINGS The ventricles are dilated and the sulci are prominent compatible with age-related atrophy. Periventricular and deep white matter hypodensities represent chronic microvascular ischemic changes. There is no intracranial hemorrhage. No mass effect or midline shift identified. The visualized mastoid air cells are well aerated. There is opacification of the visualized left maxillary sinus and partial opacification of the left sphenoid sinus. Left periorbital hematoma. The calvarium is intact. CT MAXILLOFACIAL FINDINGS The mandible is intact. There is a nondisplaced fracture of the left zygomatic arch. There are fractures of the posterior lateral wall of the left maxillary sinus. There is fracture of the left orbital  floor with fracture of the left inferior orbital rim. There is apparent abutment of the left inferior rectus muscle to the orbital floor fracture. Clinical correlation is recommended to evaluate for ocular muscle entrapment. There is a dysconjugate gaze. There is slight ovoid appearance of the left globe. Correlation with ophthalmological exam recommended. There is stranding of the left retro-orbital fat . Linear high density noted along the left orbital floor as well as along the roof of the left orbit compatible with hematoma. The lamina papyracea is intact. The right orbit, right retro-orbital fat, and right orbital walls are intact. The nasal  bone and nasal septum is intact. The pterygoid plates appear unremarkable. There is left periorbital hematoma. There is near complete opacification of the left maxillary sinus with partial opacification of the left sphenoid sinus. Remainder of the visualized paranasal sinuses and mastoid air cells are well aerated. CT CERVICAL SPINE FINDINGS There is no acute fracture or subluxation of the cervical spine.There is osteopenia with multilevel degenerative changes of the spine.The odontoid and spinous processes are intact.There is normal anatomic alignment of the C1-C2 lateral masses. The visualized soft tissues appear unremarkable. There is a 0.8 x 0.7 cm ground-glass nodular density in the right apical region (series 402 image 76). IMPRESSION: No acute intracranial hemorrhage. Age-related atrophy and chronic microvascular ischemic disease. No acute/traumatic cervical spine pathology. Left facial and orbital fractures as described. There is slight oval appearance of the left globe. Correlation with ophthalmological exam recommended. Electronically Signed: By: Anner Crete M.D. On: 01/25/2015 22:49   Ct Cervical Spine Wo Contrast  01/25/2015  ADDENDUM REPORT: 01/25/2015 23:06 ADDENDUM: Please note there is a small left frontal hemorrhage likely combination of a small  subdural and subarachnoid. Left frontal cortical contusion also noted. A 5 mm round focus over the right parietal convexity (series 201, image 22) may also represent focal cortical contusion. Left occipital scalp hematoma. These results were called by telephone at the time of interpretation on 01/25/2015 at 11:06 pm to Dr. Sherian Maroon , who verbally acknowledged these results. Electronically Signed   By: Anner Crete M.D.   On: 01/25/2015 23:06  01/25/2015  CLINICAL DATA:  81 year old female with fall and left leg pain. EXAM: CT HEAD WITHOUT CONTRAST CT MAXILLOFACIAL WITHOUT CONTRAST CT CERVICAL SPINE WITHOUT CONTRAST TECHNIQUE: Multidetector CT imaging of the head, cervical spine, and maxillofacial structures were performed using the standard protocol without intravenous contrast. Multiplanar CT image reconstructions of the cervical spine and maxillofacial structures were also generated. COMPARISON:  Head CT dated 04/14/2014 FINDINGS: CT HEAD FINDINGS The ventricles are dilated and the sulci are prominent compatible with age-related atrophy. Periventricular and deep white matter hypodensities represent chronic microvascular ischemic changes. There is no intracranial hemorrhage. No mass effect or midline shift identified. The visualized mastoid air cells are well aerated. There is opacification of the visualized left maxillary sinus and partial opacification of the left sphenoid sinus. Left periorbital hematoma. The calvarium is intact. CT MAXILLOFACIAL FINDINGS The mandible is intact. There is a nondisplaced fracture of the left zygomatic arch. There are fractures of the posterior lateral wall of the left maxillary sinus. There is fracture of the left orbital floor with fracture of the left inferior orbital rim. There is apparent abutment of the left inferior rectus muscle to the orbital floor fracture. Clinical correlation is recommended to evaluate for ocular muscle entrapment. There is a dysconjugate gaze.  There is slight ovoid appearance of the left globe. Correlation with ophthalmological exam recommended. There is stranding of the left retro-orbital fat . Linear high density noted along the left orbital floor as well as along the roof of the left orbit compatible with hematoma. The lamina papyracea is intact. The right orbit, right retro-orbital fat, and right orbital walls are intact. The nasal bone and nasal septum is intact. The pterygoid plates appear unremarkable. There is left periorbital hematoma. There is near complete opacification of the left maxillary sinus with partial opacification of the left sphenoid sinus. Remainder of the visualized paranasal sinuses and mastoid air cells are well aerated. CT CERVICAL SPINE FINDINGS There is no acute fracture  or subluxation of the cervical spine.There is osteopenia with multilevel degenerative changes of the spine.The odontoid and spinous processes are intact.There is normal anatomic alignment of the C1-C2 lateral masses. The visualized soft tissues appear unremarkable. There is a 0.8 x 0.7 cm ground-glass nodular density in the right apical region (series 402 image 76). IMPRESSION: No acute intracranial hemorrhage. Age-related atrophy and chronic microvascular ischemic disease. No acute/traumatic cervical spine pathology. Left facial and orbital fractures as described. There is slight oval appearance of the left globe. Correlation with ophthalmological exam recommended. Electronically Signed: By: Anner Crete M.D. On: 01/25/2015 22:49   Ct Maxillofacial Wo Cm  01/25/2015  ADDENDUM REPORT: 01/25/2015 23:06 ADDENDUM: Please note there is a small left frontal hemorrhage likely combination of a small subdural and subarachnoid. Left frontal cortical contusion also noted. A 5 mm round focus over the right parietal convexity (series 201, image 22) may also represent focal cortical contusion. Left occipital scalp hematoma. These results were called by telephone at  the time of interpretation on 01/25/2015 at 11:06 pm to Dr. Sherian Maroon , who verbally acknowledged these results. Electronically Signed   By: Anner Crete M.D.   On: 01/25/2015 23:06  01/25/2015  CLINICAL DATA:  81 year old female with fall and left leg pain. EXAM: CT HEAD WITHOUT CONTRAST CT MAXILLOFACIAL WITHOUT CONTRAST CT CERVICAL SPINE WITHOUT CONTRAST TECHNIQUE: Multidetector CT imaging of the head, cervical spine, and maxillofacial structures were performed using the standard protocol without intravenous contrast. Multiplanar CT image reconstructions of the cervical spine and maxillofacial structures were also generated. COMPARISON:  Head CT dated 04/14/2014 FINDINGS: CT HEAD FINDINGS The ventricles are dilated and the sulci are prominent compatible with age-related atrophy. Periventricular and deep white matter hypodensities represent chronic microvascular ischemic changes. There is no intracranial hemorrhage. No mass effect or midline shift identified. The visualized mastoid air cells are well aerated. There is opacification of the visualized left maxillary sinus and partial opacification of the left sphenoid sinus. Left periorbital hematoma. The calvarium is intact. CT MAXILLOFACIAL FINDINGS The mandible is intact. There is a nondisplaced fracture of the left zygomatic arch. There are fractures of the posterior lateral wall of the left maxillary sinus. There is fracture of the left orbital floor with fracture of the left inferior orbital rim. There is apparent abutment of the left inferior rectus muscle to the orbital floor fracture. Clinical correlation is recommended to evaluate for ocular muscle entrapment. There is a dysconjugate gaze. There is slight ovoid appearance of the left globe. Correlation with ophthalmological exam recommended. There is stranding of the left retro-orbital fat . Linear high density noted along the left orbital floor as well as along the roof of the left orbit compatible  with hematoma. The lamina papyracea is intact. The right orbit, right retro-orbital fat, and right orbital walls are intact. The nasal bone and nasal septum is intact. The pterygoid plates appear unremarkable. There is left periorbital hematoma. There is near complete opacification of the left maxillary sinus with partial opacification of the left sphenoid sinus. Remainder of the visualized paranasal sinuses and mastoid air cells are well aerated. CT CERVICAL SPINE FINDINGS There is no acute fracture or subluxation of the cervical spine.There is osteopenia with multilevel degenerative changes of the spine.The odontoid and spinous processes are intact.There is normal anatomic alignment of the C1-C2 lateral masses. The visualized soft tissues appear unremarkable. There is a 0.8 x 0.7 cm ground-glass nodular density in the right apical region (series 402 image 76). IMPRESSION: No  acute intracranial hemorrhage. Age-related atrophy and chronic microvascular ischemic disease. No acute/traumatic cervical spine pathology. Left facial and orbital fractures as described. There is slight oval appearance of the left globe. Correlation with ophthalmological exam recommended. Electronically Signed: By: Anner Crete M.D. On: 01/25/2015 22:49    Review of Systems  All other systems reviewed and are negative.  Blood pressure 147/54, pulse 62, temperature 99.2 F (37.3 C), temperature source Oral, resp. rate 18, height 5' 1"  (1.549 m), weight 60.1 kg (132 lb 7.9 oz), SpO2 98 %. Physical Exam  Constitutional: She is oriented to person, place, and time. She appears well-developed and well-nourished.  HENT:  Head: Normocephalic.  Cardiovascular: Normal rate.   Respiratory: Effort normal.  Musculoskeletal:       Right wrist: She exhibits tenderness and swelling.       Left wrist: She exhibits tenderness, bony tenderness and deformity.  Bilateral distal radius fractures with minimal right displacement and left  dorsally displaced  Neurological: She is alert and oriented to person, place, and time.  Skin: Skin is warm.  Psychiatric: She has a normal mood and affect. Her behavior is normal. Judgment and thought content normal.    Assessment/Plan: Right side needs nonop treatment and left side needs either closed reduction or orif  Will try to coordinate with surgery  Natilie Krabbenhoft A 01/26/2015, 11:43 AM

## 2015-01-30 NOTE — Anesthesia Preprocedure Evaluation (Addendum)
Anesthesia Evaluation  Patient identified by MRN, date of birth, ID band Patient awake    Reviewed: Allergy & Precautions, NPO status , Patient's Chart, lab work & pertinent test results  Airway Mallampati: II  TM Distance: >3 FB Neck ROM: Full    Dental no notable dental hx.    Pulmonary neg pulmonary ROS, Current Smoker, former smoker,    Pulmonary exam normal breath sounds clear to auscultation       Cardiovascular hypertension, Pt. on medications + Peripheral Vascular Disease  Normal cardiovascular exam Rhythm:Regular Rate:Normal  Echo 04/2014 - Left ventricle: The cavity size was normal. Wall thickness wasincreased in a pattern of mild LVH. There was focal basalhypertrophy. Systolic function was normal. The estimated ejectionfraction was in the range of 60% to 65%. Wall motion was normal;there were no regional wall motion abnormalities. Dopplerparameters are consistent with abnormal left ventricularrelaxation (grade 1 diastolic dysfunction). - Mitral valve: There was mild regurgitation. - Pulmonary arteries: Systolic pressure was mildly increased. PApeak pressure: 31 mm Hg (S). - Left ventricle: The cavity size was normal. Wall thickness wasincreased in a pattern of mild LVH. There was focal basalhypertrophy. Systolic function was normal. The estimated ejectionfraction was in the range of 60% to 65%. Wall motion was normal;there were no regional wall motion abnormalities. Dopplerparameters are consistent with abnormal left ventricularrelaxation (grade 1 diastolic dysfunction). - Mitral valve: There was mild regurgitation. - Pulmonary arteries: Systolic pressure was mildly increased. PApeak pressure: 31 mm Hg (S).    Neuro/Psych PSYCHIATRIC DISORDERS Anxiety Depression CVA    GI/Hepatic Neg liver ROS, PUD, GERD  ,  Endo/Other  negative endocrine ROS  Renal/GU Renal diseasenegative Renal ROS      Musculoskeletal negative musculoskeletal ROS (+)   Abdominal   Peds  Hematology negative hematology ROS (+)   Anesthesia Other Findings   Reproductive/Obstetrics negative OB ROS                            Anesthesia Physical  Anesthesia Plan  ASA: III  Anesthesia Plan: MAC and Regional   Post-op Pain Management:    Induction: Intravenous  Airway Management Planned:   Additional Equipment:   Intra-op Plan:   Post-operative Plan:   Informed Consent: I have reviewed the patients History and Physical, chart, labs and discussed the procedure including the risks, benefits and alternatives for the proposed anesthesia with the patient or authorized representative who has indicated his/her understanding and acceptance.   Dental advisory given  Plan Discussed with: CRNA  Anesthesia Plan Comments:        Anesthesia Quick Evaluation

## 2015-01-30 NOTE — Interval H&P Note (Signed)
History and Physical Interval Note:  01/30/2015 11:28 AM  Allison [name]  has presented today for surgery, with the diagnosis of FRACTURE LEFT RADIUS   The various methods of treatment have been discussed with the patient and family. After consideration of risks, benefits and other options for treatment, the patient has consented to  Procedure(s): OPEN REDUCTION INTERNAL FIXATION (ORIF) LEFT DISTAL RADIUS FRACTURE (Left) LEFT CARPAL TUNNEL RELEASE (Left) as a surgical intervention .  The patient's history has been reviewed, patient examined, no change in status, stable for surgery.  I have reviewed the patient's chart and labs.  Questions were answered to the patient's satisfaction.     Charlotte Crumb A

## 2015-01-30 NOTE — Anesthesia Postprocedure Evaluation (Signed)
Anesthesia Post Note  Patient: Allison [name]  Procedure(s) Performed: Procedure(s) (LRB): OPEN REDUCTION INTERNAL FIXATION (ORIF) LEFT DISTAL RADIUS FRACTURE (Left) LEFT CARPAL TUNNEL RELEASE (Left) CLOSED REDUCTION RADIAL FRACTURE (Right)  Patient location during evaluation: PACU Anesthesia Type: MAC and Regional Level of consciousness: awake Pain management: pain level controlled Vital Signs Assessment: post-procedure vital signs reviewed and stable Respiratory status: spontaneous breathing Cardiovascular status: stable Anesthetic complications: no    Last Vitals:  Filed Vitals:   01/30/15 1312 01/30/15 1340  BP: 126/57 140/46  Pulse:  53  Temp: 36.2 C 36.7 C  Resp: 14 16    Last Pain:  Filed Vitals:   01/30/15 1341  PainSc: 0-No pain                 Nolon Nations

## 2015-01-30 NOTE — Op Note (Addendum)
NAMEJOLANDA, [name] NO.:  [id number]  MEDICAL RECORD NO.:  [id number]  LOCATION:  5C10C                        FACILITY:  [facility]  PHYSICIAN:  Sheral Apley. Jalila Goodnough, M.D.DATE OF BIRTH:  Jan 18, 1927  DATE OF PROCEDURE:  01/30/2015 DATE OF DISCHARGE:                              OPERATIVE REPORT   PREOPERATIVE DIAGNOSES:  Displaced intra-articular fracture, distal radius, left side and minimally displaced right extra-articular distal radius fracture.  POSTOPERATIVE DIAGNOSES:  Displaced intra-articular fracture, distal radius, left side and minimally displaced right extra-articular distal radius fracture.  PROCEDURE:  Open reduction and internal fixation with carpal tunnel release, left intra-articular distal radius fracture, 4 part and closed treatment of right distal radius fracture.  SURGEON:  Sheral Apley. Burney Gauze, M.D.  ASSISTANT:  None.  ANESTHESIA:  Block and IV sedation.  COMPLICATIONS:  No complication.  DRAINS:  No drains.  DESCRIPTION OF PROCEDURE:  The patient was taken to the operating suite. After induction of adequate IV sedation and a left axillary block that was performed by Anesthesia, left upper extremity was prepped and draped in sterile fashion.  An Esmarch was used to exsanguinate the limb. Tourniquet was inflated to 250 mmHg.  At this point in time, incision was made along the palpable border of the flexor carpi radialis tendon. Left wrist skin was incised sharply.  The sheath overlying the FCR was incised.  The FCR was retracted to the midline, radial artery the lateral side.  Fascia was incised, dissection was carried down the pronator quadratus.  We subperiosteally stripped pronator quadratus off the distal radius volarly exposing a 4-part complex intra-articular fracture, released brachioradialis off the radial styloid fragment to ease in reduction.  Reduction was performed with longitudinal traction, flexion ulnar deviation.  A  standard DVR plate was then placed on the volar aspect of the distal radius.  A single screw was placed in the slotted drill hole and fluoroscopy was used to determine adequate plate position.  This was followed by placing 2 more cortical screws proximally followed by the smooth pegs distally.  Intraoperative fluoroscopy revealed adequate reduction in AP, lateral, and oblique view.  Median nerve was identified in the proximal wound, traced to the edge of the carpal canal.  A Freer elevator was used to create a path dorsal and volar to the transverse carpal ligament.  The median nerve was protected, and the transverse carpal ligament was divided from proximal distal release of the median nerve.  Wound was irrigated and loosely closed in layers of 4-0 undyed Vicryl and 3-0 Prolene subcuticular stitch on the skin.  Steri-Strips, 4x4s, fluffs, and compressive dressing was applied as well the volar splint.  We then used image intensification to visualize the right distal radius and there appeared to be an extra-articular fracture with acceptable alignment. This was placed in well-padded volar splint.  The patient tolerated all procedures well and went to the recovery room in stable fashion.     Sheral Apley Burney Gauze, M.D.  Addendum to operative report  Patient had left wrist post traumatic median nerve compression and had carpal tunnel release at time of distal radius fracture fixation   MAW/MEDQ  D:  01/30/2015  T:  01/30/2015  Job:  [id number]

## 2015-01-30 NOTE — Op Note (Signed)
See note [phone]

## 2015-01-30 NOTE — Transfer of Care (Signed)
Immediate Anesthesia Transfer of Care Note  Patient: Allison [name]  Procedure(s) Performed: Procedure(s): OPEN REDUCTION INTERNAL FIXATION (ORIF) LEFT DISTAL RADIUS FRACTURE (Left) LEFT CARPAL TUNNEL RELEASE (Left) CLOSED REDUCTION RADIAL FRACTURE (Right)  Patient Location: PACU  Anesthesia Type:MAC  Level of Consciousness: awake, alert , patient cooperative and responds to stimulation  Airway & Oxygen Therapy: Patient Spontanous Breathing  Post-op Assessment: Report given to RN, Post -op Vital signs reviewed and stable and Patient moving all extremities X 4  Post vital signs: Reviewed and stable  Last Vitals:  Filed Vitals:   01/30/15 0504 01/30/15 0934  BP: 170/69 114/65  Pulse: 52 58  Temp: 36.7 C 36.7 C  Resp: 18 16    Complications: No apparent anesthesia complications

## 2015-01-30 NOTE — Progress Notes (Signed)
Discharge orders received, Pt for discharge home today. IV d/c'd. D/c instructions and RX given with verbalized understanding. Family at bedside to assist patient with discharge. Staff bought pt downstairs via wheelchair. 01/30/15 1737

## 2015-01-30 NOTE — Care Management Note (Signed)
Case Management Note  Patient Details  Name: ANIESHA HAUGHN MRN: [id number] Date of Birth: 12-08-1926  Subjective/Objective:  Pt admitted on 01/25/15 s/p ground level fall with Lt frontal hemorrhage, Lt occipital scalp hematoma, Rt radius fracture, and Lt radius/ulna fracture.  PTA, pt resided at home with spouse.   She has supportive son, who is a Engineer, drilling.       Action/Plan: Planning dc home today with family.  Met with pt; she states she will go to son's home in [place] to recover, and stay with son and daughter in law.  Left Private Duty/Sitter list for pt to use if needed when she comes back to [place].  She is appreciative of my visit.    Expected Discharge Date:    01/30/15              Expected Discharge Plan:  Home/Self Care  In-House Referral:     Discharge planning Services  CM Consult  Post Acute Care Choice:    Choice offered to:     DME Arranged:    DME Agency:     HH Arranged:    HH Agency:     Status of Service:  Completed, signed off  Medicare Important Message Given:  Yes Date Medicare IM Given:    Medicare IM give by:    Date Additional Medicare IM Given:    Additional Medicare Important Message give by:     If discussed at [place] of Stay Meetings, dates discussed:    Additional Comments:  Reinaldo Raddle, RN, BSN  Trauma/Neuro ICU Case Manager [phone]

## 2015-01-31 ENCOUNTER — Encounter (HOSPITAL_COMMUNITY): Payer: Self-pay | Admitting: Orthopedic Surgery

## 2015-02-01 ENCOUNTER — Telehealth (HOSPITAL_COMMUNITY): Payer: Self-pay | Admitting: Orthopedic Surgery

## 2015-02-02 NOTE — Telephone Encounter (Signed)
Dr. Hulen Skains returned phone call.

## 2015-02-03 DIAGNOSIS — E559 Vitamin D deficiency, unspecified: Secondary | ICD-10-CM | POA: Diagnosis not present

## 2015-02-03 DIAGNOSIS — F339 Major depressive disorder, recurrent, unspecified: Secondary | ICD-10-CM | POA: Diagnosis not present

## 2015-02-03 DIAGNOSIS — S52502D Unspecified fracture of the lower end of left radius, subsequent encounter for closed fracture with routine healing: Secondary | ICD-10-CM | POA: Diagnosis not present

## 2015-02-03 DIAGNOSIS — Z9181 History of falling: Secondary | ICD-10-CM | POA: Diagnosis not present

## 2015-02-03 DIAGNOSIS — E7800 Pure hypercholesterolemia, unspecified: Secondary | ICD-10-CM | POA: Diagnosis not present

## 2015-02-03 DIAGNOSIS — H5441 Blindness, right eye, normal vision left eye: Secondary | ICD-10-CM | POA: Diagnosis not present

## 2015-02-03 DIAGNOSIS — S01112D Laceration without foreign body of left eyelid and periocular area, subsequent encounter: Secondary | ICD-10-CM | POA: Diagnosis not present

## 2015-02-03 DIAGNOSIS — I69398 Other sequelae of cerebral infarction: Secondary | ICD-10-CM | POA: Diagnosis not present

## 2015-02-03 DIAGNOSIS — I10 Essential (primary) hypertension: Secondary | ICD-10-CM | POA: Diagnosis not present

## 2015-02-03 DIAGNOSIS — G629 Polyneuropathy, unspecified: Secondary | ICD-10-CM | POA: Diagnosis not present

## 2015-02-03 DIAGNOSIS — K589 Irritable bowel syndrome without diarrhea: Secondary | ICD-10-CM | POA: Diagnosis not present

## 2015-02-03 DIAGNOSIS — I493 Ventricular premature depolarization: Secondary | ICD-10-CM | POA: Diagnosis not present

## 2015-02-03 DIAGNOSIS — S065X0D Traumatic subdural hemorrhage without loss of consciousness, subsequent encounter: Secondary | ICD-10-CM | POA: Diagnosis not present

## 2015-02-03 DIAGNOSIS — F419 Anxiety disorder, unspecified: Secondary | ICD-10-CM | POA: Diagnosis not present

## 2015-02-03 DIAGNOSIS — K279 Peptic ulcer, site unspecified, unspecified as acute or chronic, without hemorrhage or perforation: Secondary | ICD-10-CM | POA: Diagnosis not present

## 2015-02-03 DIAGNOSIS — S62101D Fracture of unspecified carpal bone, right wrist, subsequent encounter for fracture with routine healing: Secondary | ICD-10-CM | POA: Diagnosis not present

## 2015-02-04 DIAGNOSIS — S62101D Fracture of unspecified carpal bone, right wrist, subsequent encounter for fracture with routine healing: Secondary | ICD-10-CM | POA: Diagnosis not present

## 2015-02-04 DIAGNOSIS — F339 Major depressive disorder, recurrent, unspecified: Secondary | ICD-10-CM | POA: Diagnosis not present

## 2015-02-04 DIAGNOSIS — I10 Essential (primary) hypertension: Secondary | ICD-10-CM | POA: Diagnosis not present

## 2015-02-04 DIAGNOSIS — S065X0D Traumatic subdural hemorrhage without loss of consciousness, subsequent encounter: Secondary | ICD-10-CM | POA: Diagnosis not present

## 2015-02-04 DIAGNOSIS — S01112D Laceration without foreign body of left eyelid and periocular area, subsequent encounter: Secondary | ICD-10-CM | POA: Diagnosis not present

## 2015-02-04 DIAGNOSIS — S52502D Unspecified fracture of the lower end of left radius, subsequent encounter for closed fracture with routine healing: Secondary | ICD-10-CM | POA: Diagnosis not present

## 2015-02-06 DIAGNOSIS — F339 Major depressive disorder, recurrent, unspecified: Secondary | ICD-10-CM | POA: Diagnosis not present

## 2015-02-06 DIAGNOSIS — S52502D Unspecified fracture of the lower end of left radius, subsequent encounter for closed fracture with routine healing: Secondary | ICD-10-CM | POA: Diagnosis not present

## 2015-02-06 DIAGNOSIS — S065X0D Traumatic subdural hemorrhage without loss of consciousness, subsequent encounter: Secondary | ICD-10-CM | POA: Diagnosis not present

## 2015-02-06 DIAGNOSIS — I10 Essential (primary) hypertension: Secondary | ICD-10-CM | POA: Diagnosis not present

## 2015-02-06 DIAGNOSIS — S62101D Fracture of unspecified carpal bone, right wrist, subsequent encounter for fracture with routine healing: Secondary | ICD-10-CM | POA: Diagnosis not present

## 2015-02-06 DIAGNOSIS — S01112D Laceration without foreign body of left eyelid and periocular area, subsequent encounter: Secondary | ICD-10-CM | POA: Diagnosis not present

## 2015-02-07 DIAGNOSIS — S52532D Colles' fracture of left radius, subsequent encounter for closed fracture with routine healing: Secondary | ICD-10-CM | POA: Diagnosis not present

## 2015-02-07 DIAGNOSIS — S52531D Colles' fracture of right radius, subsequent encounter for closed fracture with routine healing: Secondary | ICD-10-CM | POA: Diagnosis not present

## 2015-02-08 DIAGNOSIS — S06379A Contusion, laceration, and hemorrhage of cerebellum with loss of consciousness of unspecified duration, initial encounter: Secondary | ICD-10-CM | POA: Diagnosis not present

## 2015-02-08 DIAGNOSIS — G9389 Other specified disorders of brain: Secondary | ICD-10-CM | POA: Diagnosis not present

## 2015-02-11 DIAGNOSIS — S062X9D Diffuse traumatic brain injury with loss of consciousness of unspecified duration, subsequent encounter: Secondary | ICD-10-CM | POA: Diagnosis not present

## 2015-02-12 DIAGNOSIS — F339 Major depressive disorder, recurrent, unspecified: Secondary | ICD-10-CM | POA: Diagnosis not present

## 2015-02-12 DIAGNOSIS — S065X0D Traumatic subdural hemorrhage without loss of consciousness, subsequent encounter: Secondary | ICD-10-CM | POA: Diagnosis not present

## 2015-02-12 DIAGNOSIS — I10 Essential (primary) hypertension: Secondary | ICD-10-CM | POA: Diagnosis not present

## 2015-02-12 DIAGNOSIS — S01112D Laceration without foreign body of left eyelid and periocular area, subsequent encounter: Secondary | ICD-10-CM | POA: Diagnosis not present

## 2015-02-12 DIAGNOSIS — S52502D Unspecified fracture of the lower end of left radius, subsequent encounter for closed fracture with routine healing: Secondary | ICD-10-CM | POA: Diagnosis not present

## 2015-02-12 DIAGNOSIS — S62101D Fracture of unspecified carpal bone, right wrist, subsequent encounter for fracture with routine healing: Secondary | ICD-10-CM | POA: Diagnosis not present

## 2015-02-13 DIAGNOSIS — S62101D Fracture of unspecified carpal bone, right wrist, subsequent encounter for fracture with routine healing: Secondary | ICD-10-CM | POA: Diagnosis not present

## 2015-02-13 DIAGNOSIS — S52502D Unspecified fracture of the lower end of left radius, subsequent encounter for closed fracture with routine healing: Secondary | ICD-10-CM | POA: Diagnosis not present

## 2015-02-13 DIAGNOSIS — S01112D Laceration without foreign body of left eyelid and periocular area, subsequent encounter: Secondary | ICD-10-CM | POA: Diagnosis not present

## 2015-02-13 DIAGNOSIS — S065X0D Traumatic subdural hemorrhage without loss of consciousness, subsequent encounter: Secondary | ICD-10-CM | POA: Diagnosis not present

## 2015-02-13 DIAGNOSIS — F339 Major depressive disorder, recurrent, unspecified: Secondary | ICD-10-CM | POA: Diagnosis not present

## 2015-02-13 DIAGNOSIS — I10 Essential (primary) hypertension: Secondary | ICD-10-CM | POA: Diagnosis not present

## 2015-02-14 DIAGNOSIS — S52502D Unspecified fracture of the lower end of left radius, subsequent encounter for closed fracture with routine healing: Secondary | ICD-10-CM | POA: Diagnosis not present

## 2015-02-14 DIAGNOSIS — S62101D Fracture of unspecified carpal bone, right wrist, subsequent encounter for fracture with routine healing: Secondary | ICD-10-CM | POA: Diagnosis not present

## 2015-02-14 DIAGNOSIS — F339 Major depressive disorder, recurrent, unspecified: Secondary | ICD-10-CM | POA: Diagnosis not present

## 2015-02-14 DIAGNOSIS — S01112D Laceration without foreign body of left eyelid and periocular area, subsequent encounter: Secondary | ICD-10-CM | POA: Diagnosis not present

## 2015-02-14 DIAGNOSIS — S065X0D Traumatic subdural hemorrhage without loss of consciousness, subsequent encounter: Secondary | ICD-10-CM | POA: Diagnosis not present

## 2015-02-14 DIAGNOSIS — I10 Essential (primary) hypertension: Secondary | ICD-10-CM | POA: Diagnosis not present

## 2015-02-15 DIAGNOSIS — F339 Major depressive disorder, recurrent, unspecified: Secondary | ICD-10-CM | POA: Diagnosis not present

## 2015-02-15 DIAGNOSIS — S065X0D Traumatic subdural hemorrhage without loss of consciousness, subsequent encounter: Secondary | ICD-10-CM | POA: Diagnosis not present

## 2015-02-15 DIAGNOSIS — I10 Essential (primary) hypertension: Secondary | ICD-10-CM | POA: Diagnosis not present

## 2015-02-15 DIAGNOSIS — S62101D Fracture of unspecified carpal bone, right wrist, subsequent encounter for fracture with routine healing: Secondary | ICD-10-CM | POA: Diagnosis not present

## 2015-02-15 DIAGNOSIS — S01112D Laceration without foreign body of left eyelid and periocular area, subsequent encounter: Secondary | ICD-10-CM | POA: Diagnosis not present

## 2015-02-15 DIAGNOSIS — S52502D Unspecified fracture of the lower end of left radius, subsequent encounter for closed fracture with routine healing: Secondary | ICD-10-CM | POA: Diagnosis not present

## 2015-02-18 DIAGNOSIS — S065X0D Traumatic subdural hemorrhage without loss of consciousness, subsequent encounter: Secondary | ICD-10-CM | POA: Diagnosis not present

## 2015-02-18 DIAGNOSIS — I10 Essential (primary) hypertension: Secondary | ICD-10-CM | POA: Diagnosis not present

## 2015-02-18 DIAGNOSIS — S01112D Laceration without foreign body of left eyelid and periocular area, subsequent encounter: Secondary | ICD-10-CM | POA: Diagnosis not present

## 2015-02-18 DIAGNOSIS — F339 Major depressive disorder, recurrent, unspecified: Secondary | ICD-10-CM | POA: Diagnosis not present

## 2015-02-18 DIAGNOSIS — S52502D Unspecified fracture of the lower end of left radius, subsequent encounter for closed fracture with routine healing: Secondary | ICD-10-CM | POA: Diagnosis not present

## 2015-02-18 DIAGNOSIS — S62101D Fracture of unspecified carpal bone, right wrist, subsequent encounter for fracture with routine healing: Secondary | ICD-10-CM | POA: Diagnosis not present

## 2015-02-20 DIAGNOSIS — S01112D Laceration without foreign body of left eyelid and periocular area, subsequent encounter: Secondary | ICD-10-CM | POA: Diagnosis not present

## 2015-02-20 DIAGNOSIS — S52502D Unspecified fracture of the lower end of left radius, subsequent encounter for closed fracture with routine healing: Secondary | ICD-10-CM | POA: Diagnosis not present

## 2015-02-20 DIAGNOSIS — I10 Essential (primary) hypertension: Secondary | ICD-10-CM | POA: Diagnosis not present

## 2015-02-20 DIAGNOSIS — F339 Major depressive disorder, recurrent, unspecified: Secondary | ICD-10-CM | POA: Diagnosis not present

## 2015-02-20 DIAGNOSIS — S62101D Fracture of unspecified carpal bone, right wrist, subsequent encounter for fracture with routine healing: Secondary | ICD-10-CM | POA: Diagnosis not present

## 2015-02-20 DIAGNOSIS — S065X0D Traumatic subdural hemorrhage without loss of consciousness, subsequent encounter: Secondary | ICD-10-CM | POA: Diagnosis not present

## 2015-02-21 DIAGNOSIS — F339 Major depressive disorder, recurrent, unspecified: Secondary | ICD-10-CM | POA: Diagnosis not present

## 2015-02-21 DIAGNOSIS — I10 Essential (primary) hypertension: Secondary | ICD-10-CM | POA: Diagnosis not present

## 2015-02-21 DIAGNOSIS — S52502D Unspecified fracture of the lower end of left radius, subsequent encounter for closed fracture with routine healing: Secondary | ICD-10-CM | POA: Diagnosis not present

## 2015-02-21 DIAGNOSIS — S62101D Fracture of unspecified carpal bone, right wrist, subsequent encounter for fracture with routine healing: Secondary | ICD-10-CM | POA: Diagnosis not present

## 2015-02-21 DIAGNOSIS — S01112D Laceration without foreign body of left eyelid and periocular area, subsequent encounter: Secondary | ICD-10-CM | POA: Diagnosis not present

## 2015-02-21 DIAGNOSIS — S065X0D Traumatic subdural hemorrhage without loss of consciousness, subsequent encounter: Secondary | ICD-10-CM | POA: Diagnosis not present

## 2015-02-22 DIAGNOSIS — S52502D Unspecified fracture of the lower end of left radius, subsequent encounter for closed fracture with routine healing: Secondary | ICD-10-CM | POA: Diagnosis not present

## 2015-02-22 DIAGNOSIS — S065X0D Traumatic subdural hemorrhage without loss of consciousness, subsequent encounter: Secondary | ICD-10-CM | POA: Diagnosis not present

## 2015-02-22 DIAGNOSIS — S62101D Fracture of unspecified carpal bone, right wrist, subsequent encounter for fracture with routine healing: Secondary | ICD-10-CM | POA: Diagnosis not present

## 2015-02-22 DIAGNOSIS — F339 Major depressive disorder, recurrent, unspecified: Secondary | ICD-10-CM | POA: Diagnosis not present

## 2015-02-22 DIAGNOSIS — I10 Essential (primary) hypertension: Secondary | ICD-10-CM | POA: Diagnosis not present

## 2015-02-22 DIAGNOSIS — S01112D Laceration without foreign body of left eyelid and periocular area, subsequent encounter: Secondary | ICD-10-CM | POA: Diagnosis not present

## 2015-02-25 DIAGNOSIS — I10 Essential (primary) hypertension: Secondary | ICD-10-CM | POA: Diagnosis not present

## 2015-02-25 DIAGNOSIS — S01112D Laceration without foreign body of left eyelid and periocular area, subsequent encounter: Secondary | ICD-10-CM | POA: Diagnosis not present

## 2015-02-25 DIAGNOSIS — S52502D Unspecified fracture of the lower end of left radius, subsequent encounter for closed fracture with routine healing: Secondary | ICD-10-CM | POA: Diagnosis not present

## 2015-02-25 DIAGNOSIS — F339 Major depressive disorder, recurrent, unspecified: Secondary | ICD-10-CM | POA: Diagnosis not present

## 2015-02-25 DIAGNOSIS — S62101D Fracture of unspecified carpal bone, right wrist, subsequent encounter for fracture with routine healing: Secondary | ICD-10-CM | POA: Diagnosis not present

## 2015-02-25 DIAGNOSIS — S065X0D Traumatic subdural hemorrhage without loss of consciousness, subsequent encounter: Secondary | ICD-10-CM | POA: Diagnosis not present

## 2015-02-26 DIAGNOSIS — F339 Major depressive disorder, recurrent, unspecified: Secondary | ICD-10-CM | POA: Diagnosis not present

## 2015-02-26 DIAGNOSIS — S065X0D Traumatic subdural hemorrhage without loss of consciousness, subsequent encounter: Secondary | ICD-10-CM | POA: Diagnosis not present

## 2015-02-26 DIAGNOSIS — S52502D Unspecified fracture of the lower end of left radius, subsequent encounter for closed fracture with routine healing: Secondary | ICD-10-CM | POA: Diagnosis not present

## 2015-02-26 DIAGNOSIS — S62101D Fracture of unspecified carpal bone, right wrist, subsequent encounter for fracture with routine healing: Secondary | ICD-10-CM | POA: Diagnosis not present

## 2015-02-26 DIAGNOSIS — I10 Essential (primary) hypertension: Secondary | ICD-10-CM | POA: Diagnosis not present

## 2015-02-26 DIAGNOSIS — S01112D Laceration without foreign body of left eyelid and periocular area, subsequent encounter: Secondary | ICD-10-CM | POA: Diagnosis not present

## 2015-02-27 DIAGNOSIS — S065X0D Traumatic subdural hemorrhage without loss of consciousness, subsequent encounter: Secondary | ICD-10-CM | POA: Diagnosis not present

## 2015-02-27 DIAGNOSIS — I10 Essential (primary) hypertension: Secondary | ICD-10-CM | POA: Diagnosis not present

## 2015-02-27 DIAGNOSIS — S62101D Fracture of unspecified carpal bone, right wrist, subsequent encounter for fracture with routine healing: Secondary | ICD-10-CM | POA: Diagnosis not present

## 2015-02-27 DIAGNOSIS — S01112D Laceration without foreign body of left eyelid and periocular area, subsequent encounter: Secondary | ICD-10-CM | POA: Diagnosis not present

## 2015-02-27 DIAGNOSIS — F339 Major depressive disorder, recurrent, unspecified: Secondary | ICD-10-CM | POA: Diagnosis not present

## 2015-02-27 DIAGNOSIS — S52502D Unspecified fracture of the lower end of left radius, subsequent encounter for closed fracture with routine healing: Secondary | ICD-10-CM | POA: Diagnosis not present

## 2015-02-28 DIAGNOSIS — I10 Essential (primary) hypertension: Secondary | ICD-10-CM | POA: Diagnosis not present

## 2015-02-28 DIAGNOSIS — S065X0D Traumatic subdural hemorrhage without loss of consciousness, subsequent encounter: Secondary | ICD-10-CM | POA: Diagnosis not present

## 2015-02-28 DIAGNOSIS — S01112D Laceration without foreign body of left eyelid and periocular area, subsequent encounter: Secondary | ICD-10-CM | POA: Diagnosis not present

## 2015-02-28 DIAGNOSIS — F339 Major depressive disorder, recurrent, unspecified: Secondary | ICD-10-CM | POA: Diagnosis not present

## 2015-02-28 DIAGNOSIS — S62101D Fracture of unspecified carpal bone, right wrist, subsequent encounter for fracture with routine healing: Secondary | ICD-10-CM | POA: Diagnosis not present

## 2015-02-28 DIAGNOSIS — S52502D Unspecified fracture of the lower end of left radius, subsequent encounter for closed fracture with routine healing: Secondary | ICD-10-CM | POA: Diagnosis not present

## 2015-03-01 DIAGNOSIS — S065X0D Traumatic subdural hemorrhage without loss of consciousness, subsequent encounter: Secondary | ICD-10-CM | POA: Diagnosis not present

## 2015-03-01 DIAGNOSIS — S52502D Unspecified fracture of the lower end of left radius, subsequent encounter for closed fracture with routine healing: Secondary | ICD-10-CM | POA: Diagnosis not present

## 2015-03-01 DIAGNOSIS — S01112D Laceration without foreign body of left eyelid and periocular area, subsequent encounter: Secondary | ICD-10-CM | POA: Diagnosis not present

## 2015-03-01 DIAGNOSIS — I10 Essential (primary) hypertension: Secondary | ICD-10-CM | POA: Diagnosis not present

## 2015-03-01 DIAGNOSIS — S62101D Fracture of unspecified carpal bone, right wrist, subsequent encounter for fracture with routine healing: Secondary | ICD-10-CM | POA: Diagnosis not present

## 2015-03-01 DIAGNOSIS — F339 Major depressive disorder, recurrent, unspecified: Secondary | ICD-10-CM | POA: Diagnosis not present

## 2015-03-05 DIAGNOSIS — S065X0D Traumatic subdural hemorrhage without loss of consciousness, subsequent encounter: Secondary | ICD-10-CM | POA: Diagnosis not present

## 2015-03-05 DIAGNOSIS — S62101D Fracture of unspecified carpal bone, right wrist, subsequent encounter for fracture with routine healing: Secondary | ICD-10-CM | POA: Diagnosis not present

## 2015-03-05 DIAGNOSIS — S01112D Laceration without foreign body of left eyelid and periocular area, subsequent encounter: Secondary | ICD-10-CM | POA: Diagnosis not present

## 2015-03-05 DIAGNOSIS — S52502D Unspecified fracture of the lower end of left radius, subsequent encounter for closed fracture with routine healing: Secondary | ICD-10-CM | POA: Diagnosis not present

## 2015-03-05 DIAGNOSIS — F339 Major depressive disorder, recurrent, unspecified: Secondary | ICD-10-CM | POA: Diagnosis not present

## 2015-03-05 DIAGNOSIS — I10 Essential (primary) hypertension: Secondary | ICD-10-CM | POA: Diagnosis not present

## 2015-03-06 DIAGNOSIS — S52502D Unspecified fracture of the lower end of left radius, subsequent encounter for closed fracture with routine healing: Secondary | ICD-10-CM | POA: Diagnosis not present

## 2015-03-06 DIAGNOSIS — S065X0D Traumatic subdural hemorrhage without loss of consciousness, subsequent encounter: Secondary | ICD-10-CM | POA: Diagnosis not present

## 2015-03-06 DIAGNOSIS — S62101D Fracture of unspecified carpal bone, right wrist, subsequent encounter for fracture with routine healing: Secondary | ICD-10-CM | POA: Diagnosis not present

## 2015-03-06 DIAGNOSIS — S01112D Laceration without foreign body of left eyelid and periocular area, subsequent encounter: Secondary | ICD-10-CM | POA: Diagnosis not present

## 2015-03-06 DIAGNOSIS — I10 Essential (primary) hypertension: Secondary | ICD-10-CM | POA: Diagnosis not present

## 2015-03-06 DIAGNOSIS — F339 Major depressive disorder, recurrent, unspecified: Secondary | ICD-10-CM | POA: Diagnosis not present

## 2015-03-07 DIAGNOSIS — S01112D Laceration without foreign body of left eyelid and periocular area, subsequent encounter: Secondary | ICD-10-CM | POA: Diagnosis not present

## 2015-03-07 DIAGNOSIS — S62101D Fracture of unspecified carpal bone, right wrist, subsequent encounter for fracture with routine healing: Secondary | ICD-10-CM | POA: Diagnosis not present

## 2015-03-07 DIAGNOSIS — F339 Major depressive disorder, recurrent, unspecified: Secondary | ICD-10-CM | POA: Diagnosis not present

## 2015-03-07 DIAGNOSIS — I10 Essential (primary) hypertension: Secondary | ICD-10-CM | POA: Diagnosis not present

## 2015-03-07 DIAGNOSIS — S065X0D Traumatic subdural hemorrhage without loss of consciousness, subsequent encounter: Secondary | ICD-10-CM | POA: Diagnosis not present

## 2015-03-07 DIAGNOSIS — S52502D Unspecified fracture of the lower end of left radius, subsequent encounter for closed fracture with routine healing: Secondary | ICD-10-CM | POA: Diagnosis not present

## 2015-03-09 DIAGNOSIS — I10 Essential (primary) hypertension: Secondary | ICD-10-CM | POA: Diagnosis not present

## 2015-03-09 DIAGNOSIS — S01112D Laceration without foreign body of left eyelid and periocular area, subsequent encounter: Secondary | ICD-10-CM | POA: Diagnosis not present

## 2015-03-09 DIAGNOSIS — S62101D Fracture of unspecified carpal bone, right wrist, subsequent encounter for fracture with routine healing: Secondary | ICD-10-CM | POA: Diagnosis not present

## 2015-03-09 DIAGNOSIS — S065X0D Traumatic subdural hemorrhage without loss of consciousness, subsequent encounter: Secondary | ICD-10-CM | POA: Diagnosis not present

## 2015-03-09 DIAGNOSIS — F339 Major depressive disorder, recurrent, unspecified: Secondary | ICD-10-CM | POA: Diagnosis not present

## 2015-03-09 DIAGNOSIS — S52502D Unspecified fracture of the lower end of left radius, subsequent encounter for closed fracture with routine healing: Secondary | ICD-10-CM | POA: Diagnosis not present

## 2015-03-12 DIAGNOSIS — S52532D Colles' fracture of left radius, subsequent encounter for closed fracture with routine healing: Secondary | ICD-10-CM | POA: Diagnosis not present

## 2015-03-13 DIAGNOSIS — S01112D Laceration without foreign body of left eyelid and periocular area, subsequent encounter: Secondary | ICD-10-CM | POA: Diagnosis not present

## 2015-03-13 DIAGNOSIS — I10 Essential (primary) hypertension: Secondary | ICD-10-CM | POA: Diagnosis not present

## 2015-03-13 DIAGNOSIS — S62101D Fracture of unspecified carpal bone, right wrist, subsequent encounter for fracture with routine healing: Secondary | ICD-10-CM | POA: Diagnosis not present

## 2015-03-13 DIAGNOSIS — S065X0D Traumatic subdural hemorrhage without loss of consciousness, subsequent encounter: Secondary | ICD-10-CM | POA: Diagnosis not present

## 2015-03-13 DIAGNOSIS — F339 Major depressive disorder, recurrent, unspecified: Secondary | ICD-10-CM | POA: Diagnosis not present

## 2015-03-13 DIAGNOSIS — S52502D Unspecified fracture of the lower end of left radius, subsequent encounter for closed fracture with routine healing: Secondary | ICD-10-CM | POA: Diagnosis not present

## 2015-03-14 DIAGNOSIS — S52502D Unspecified fracture of the lower end of left radius, subsequent encounter for closed fracture with routine healing: Secondary | ICD-10-CM | POA: Diagnosis not present

## 2015-03-14 DIAGNOSIS — I10 Essential (primary) hypertension: Secondary | ICD-10-CM | POA: Diagnosis not present

## 2015-03-14 DIAGNOSIS — S62101D Fracture of unspecified carpal bone, right wrist, subsequent encounter for fracture with routine healing: Secondary | ICD-10-CM | POA: Diagnosis not present

## 2015-03-14 DIAGNOSIS — F339 Major depressive disorder, recurrent, unspecified: Secondary | ICD-10-CM | POA: Diagnosis not present

## 2015-03-14 DIAGNOSIS — S065X0D Traumatic subdural hemorrhage without loss of consciousness, subsequent encounter: Secondary | ICD-10-CM | POA: Diagnosis not present

## 2015-03-14 DIAGNOSIS — S01112D Laceration without foreign body of left eyelid and periocular area, subsequent encounter: Secondary | ICD-10-CM | POA: Diagnosis not present

## 2015-03-15 DIAGNOSIS — I10 Essential (primary) hypertension: Secondary | ICD-10-CM | POA: Diagnosis not present

## 2015-03-15 DIAGNOSIS — S62101D Fracture of unspecified carpal bone, right wrist, subsequent encounter for fracture with routine healing: Secondary | ICD-10-CM | POA: Diagnosis not present

## 2015-03-15 DIAGNOSIS — S52502D Unspecified fracture of the lower end of left radius, subsequent encounter for closed fracture with routine healing: Secondary | ICD-10-CM | POA: Diagnosis not present

## 2015-03-15 DIAGNOSIS — S01112D Laceration without foreign body of left eyelid and periocular area, subsequent encounter: Secondary | ICD-10-CM | POA: Diagnosis not present

## 2015-03-15 DIAGNOSIS — F339 Major depressive disorder, recurrent, unspecified: Secondary | ICD-10-CM | POA: Diagnosis not present

## 2015-03-15 DIAGNOSIS — S065X0D Traumatic subdural hemorrhage without loss of consciousness, subsequent encounter: Secondary | ICD-10-CM | POA: Diagnosis not present

## 2015-03-19 DIAGNOSIS — S52502D Unspecified fracture of the lower end of left radius, subsequent encounter for closed fracture with routine healing: Secondary | ICD-10-CM | POA: Diagnosis not present

## 2015-03-19 DIAGNOSIS — S01112D Laceration without foreign body of left eyelid and periocular area, subsequent encounter: Secondary | ICD-10-CM | POA: Diagnosis not present

## 2015-03-19 DIAGNOSIS — S065X0D Traumatic subdural hemorrhage without loss of consciousness, subsequent encounter: Secondary | ICD-10-CM | POA: Diagnosis not present

## 2015-03-19 DIAGNOSIS — S62101D Fracture of unspecified carpal bone, right wrist, subsequent encounter for fracture with routine healing: Secondary | ICD-10-CM | POA: Diagnosis not present

## 2015-03-19 DIAGNOSIS — I10 Essential (primary) hypertension: Secondary | ICD-10-CM | POA: Diagnosis not present

## 2015-03-19 DIAGNOSIS — F339 Major depressive disorder, recurrent, unspecified: Secondary | ICD-10-CM | POA: Diagnosis not present

## 2015-03-21 DIAGNOSIS — I10 Essential (primary) hypertension: Secondary | ICD-10-CM | POA: Diagnosis not present

## 2015-03-21 DIAGNOSIS — S01112D Laceration without foreign body of left eyelid and periocular area, subsequent encounter: Secondary | ICD-10-CM | POA: Diagnosis not present

## 2015-03-21 DIAGNOSIS — S065X0D Traumatic subdural hemorrhage without loss of consciousness, subsequent encounter: Secondary | ICD-10-CM | POA: Diagnosis not present

## 2015-03-21 DIAGNOSIS — F339 Major depressive disorder, recurrent, unspecified: Secondary | ICD-10-CM | POA: Diagnosis not present

## 2015-03-21 DIAGNOSIS — S52502D Unspecified fracture of the lower end of left radius, subsequent encounter for closed fracture with routine healing: Secondary | ICD-10-CM | POA: Diagnosis not present

## 2015-03-21 DIAGNOSIS — S62101D Fracture of unspecified carpal bone, right wrist, subsequent encounter for fracture with routine healing: Secondary | ICD-10-CM | POA: Diagnosis not present

## 2015-03-22 DIAGNOSIS — S065X0D Traumatic subdural hemorrhage without loss of consciousness, subsequent encounter: Secondary | ICD-10-CM | POA: Diagnosis not present

## 2015-03-22 DIAGNOSIS — F339 Major depressive disorder, recurrent, unspecified: Secondary | ICD-10-CM | POA: Diagnosis not present

## 2015-03-22 DIAGNOSIS — S01112D Laceration without foreign body of left eyelid and periocular area, subsequent encounter: Secondary | ICD-10-CM | POA: Diagnosis not present

## 2015-03-22 DIAGNOSIS — S52502D Unspecified fracture of the lower end of left radius, subsequent encounter for closed fracture with routine healing: Secondary | ICD-10-CM | POA: Diagnosis not present

## 2015-03-22 DIAGNOSIS — I10 Essential (primary) hypertension: Secondary | ICD-10-CM | POA: Diagnosis not present

## 2015-03-22 DIAGNOSIS — S62101D Fracture of unspecified carpal bone, right wrist, subsequent encounter for fracture with routine healing: Secondary | ICD-10-CM | POA: Diagnosis not present

## 2015-03-25 DIAGNOSIS — S62101D Fracture of unspecified carpal bone, right wrist, subsequent encounter for fracture with routine healing: Secondary | ICD-10-CM | POA: Diagnosis not present

## 2015-03-25 DIAGNOSIS — I10 Essential (primary) hypertension: Secondary | ICD-10-CM | POA: Diagnosis not present

## 2015-03-25 DIAGNOSIS — S52502D Unspecified fracture of the lower end of left radius, subsequent encounter for closed fracture with routine healing: Secondary | ICD-10-CM | POA: Diagnosis not present

## 2015-03-25 DIAGNOSIS — I6523 Occlusion and stenosis of bilateral carotid arteries: Secondary | ICD-10-CM | POA: Diagnosis not present

## 2015-03-25 DIAGNOSIS — S065X0D Traumatic subdural hemorrhage without loss of consciousness, subsequent encounter: Secondary | ICD-10-CM | POA: Diagnosis not present

## 2015-03-25 DIAGNOSIS — F339 Major depressive disorder, recurrent, unspecified: Secondary | ICD-10-CM | POA: Diagnosis not present

## 2015-03-25 DIAGNOSIS — S01112D Laceration without foreign body of left eyelid and periocular area, subsequent encounter: Secondary | ICD-10-CM | POA: Diagnosis not present

## 2015-03-27 DIAGNOSIS — S01112D Laceration without foreign body of left eyelid and periocular area, subsequent encounter: Secondary | ICD-10-CM | POA: Diagnosis not present

## 2015-03-27 DIAGNOSIS — F339 Major depressive disorder, recurrent, unspecified: Secondary | ICD-10-CM | POA: Diagnosis not present

## 2015-03-27 DIAGNOSIS — S62101D Fracture of unspecified carpal bone, right wrist, subsequent encounter for fracture with routine healing: Secondary | ICD-10-CM | POA: Diagnosis not present

## 2015-03-27 DIAGNOSIS — S52502D Unspecified fracture of the lower end of left radius, subsequent encounter for closed fracture with routine healing: Secondary | ICD-10-CM | POA: Diagnosis not present

## 2015-03-27 DIAGNOSIS — I10 Essential (primary) hypertension: Secondary | ICD-10-CM | POA: Diagnosis not present

## 2015-03-27 DIAGNOSIS — S065X0D Traumatic subdural hemorrhage without loss of consciousness, subsequent encounter: Secondary | ICD-10-CM | POA: Diagnosis not present

## 2015-04-04 DIAGNOSIS — S52531D Colles' fracture of right radius, subsequent encounter for closed fracture with routine healing: Secondary | ICD-10-CM | POA: Diagnosis not present

## 2015-04-04 DIAGNOSIS — S52532D Colles' fracture of left radius, subsequent encounter for closed fracture with routine healing: Secondary | ICD-10-CM | POA: Diagnosis not present

## 2015-04-22 DIAGNOSIS — I10 Essential (primary) hypertension: Secondary | ICD-10-CM | POA: Diagnosis not present

## 2015-04-22 DIAGNOSIS — F322 Major depressive disorder, single episode, severe without psychotic features: Secondary | ICD-10-CM | POA: Diagnosis not present

## 2015-04-22 DIAGNOSIS — R400 Somnolence: Secondary | ICD-10-CM | POA: Diagnosis not present

## 2015-05-24 DIAGNOSIS — H3411 Central retinal artery occlusion, right eye: Secondary | ICD-10-CM | POA: Diagnosis not present

## 2015-05-24 DIAGNOSIS — H3582 Retinal ischemia: Secondary | ICD-10-CM | POA: Diagnosis not present

## 2015-05-24 DIAGNOSIS — H43813 Vitreous degeneration, bilateral: Secondary | ICD-10-CM | POA: Diagnosis not present

## 2015-05-24 DIAGNOSIS — H35373 Puckering of macula, bilateral: Secondary | ICD-10-CM | POA: Diagnosis not present

## 2015-08-05 DIAGNOSIS — Z0000 Encounter for general adult medical examination without abnormal findings: Secondary | ICD-10-CM | POA: Diagnosis not present

## 2015-08-08 DIAGNOSIS — M810 Age-related osteoporosis without current pathological fracture: Secondary | ICD-10-CM | POA: Diagnosis not present

## 2015-08-08 DIAGNOSIS — F339 Major depressive disorder, recurrent, unspecified: Secondary | ICD-10-CM | POA: Diagnosis not present

## 2015-08-08 DIAGNOSIS — E7800 Pure hypercholesterolemia, unspecified: Secondary | ICD-10-CM | POA: Diagnosis not present

## 2015-08-08 DIAGNOSIS — N390 Urinary tract infection, site not specified: Secondary | ICD-10-CM | POA: Diagnosis not present

## 2015-08-08 DIAGNOSIS — I10 Essential (primary) hypertension: Secondary | ICD-10-CM | POA: Diagnosis not present

## 2015-08-08 DIAGNOSIS — L570 Actinic keratosis: Secondary | ICD-10-CM | POA: Diagnosis not present

## 2015-09-05 DIAGNOSIS — M810 Age-related osteoporosis without current pathological fracture: Secondary | ICD-10-CM | POA: Diagnosis not present

## 2015-10-21 ENCOUNTER — Observation Stay (HOSPITAL_COMMUNITY)
Admission: EM | Admit: 2015-10-21 | Discharge: 2015-10-22 | Disposition: A | Discharge status: Home / Self Care | Payer: Medicare Other | Attending: Family Medicine | Admitting: Family Medicine

## 2015-10-21 ENCOUNTER — Emergency Department (HOSPITAL_COMMUNITY): Payer: Medicare Other

## 2015-10-21 ENCOUNTER — Encounter (HOSPITAL_COMMUNITY): Payer: Self-pay | Admitting: Emergency Medicine

## 2015-10-21 DIAGNOSIS — I10 Essential (primary) hypertension: Secondary | ICD-10-CM | POA: Diagnosis not present

## 2015-10-21 DIAGNOSIS — H5441 Blindness, right eye, normal vision left eye: Secondary | ICD-10-CM | POA: Insufficient documentation

## 2015-10-21 DIAGNOSIS — Z87891 Personal history of nicotine dependence: Secondary | ICD-10-CM | POA: Diagnosis not present

## 2015-10-21 DIAGNOSIS — M810 Age-related osteoporosis without current pathological fracture: Secondary | ICD-10-CM | POA: Insufficient documentation

## 2015-10-21 DIAGNOSIS — R4789 Other speech disturbances: Secondary | ICD-10-CM | POA: Diagnosis not present

## 2015-10-21 DIAGNOSIS — K219 Gastro-esophageal reflux disease without esophagitis: Secondary | ICD-10-CM | POA: Diagnosis not present

## 2015-10-21 DIAGNOSIS — F32A Depression, unspecified: Secondary | ICD-10-CM | POA: Diagnosis present

## 2015-10-21 DIAGNOSIS — Z7902 Long term (current) use of antithrombotics/antiplatelets: Secondary | ICD-10-CM | POA: Insufficient documentation

## 2015-10-21 DIAGNOSIS — F419 Anxiety disorder, unspecified: Secondary | ICD-10-CM | POA: Diagnosis not present

## 2015-10-21 DIAGNOSIS — Z8673 Personal history of transient ischemic attack (TIA), and cerebral infarction without residual deficits: Secondary | ICD-10-CM | POA: Diagnosis not present

## 2015-10-21 DIAGNOSIS — Z87820 Personal history of traumatic brain injury: Secondary | ICD-10-CM | POA: Diagnosis not present

## 2015-10-21 DIAGNOSIS — G459 Transient cerebral ischemic attack, unspecified: Secondary | ICD-10-CM

## 2015-10-21 DIAGNOSIS — I69398 Other sequelae of cerebral infarction: Secondary | ICD-10-CM | POA: Insufficient documentation

## 2015-10-21 DIAGNOSIS — H349 Unspecified retinal vascular occlusion: Secondary | ICD-10-CM | POA: Insufficient documentation

## 2015-10-21 DIAGNOSIS — Z79899 Other long term (current) drug therapy: Secondary | ICD-10-CM | POA: Diagnosis not present

## 2015-10-21 DIAGNOSIS — R4701 Aphasia: Secondary | ICD-10-CM | POA: Diagnosis not present

## 2015-10-21 DIAGNOSIS — F329 Major depressive disorder, single episode, unspecified: Secondary | ICD-10-CM | POA: Diagnosis not present

## 2015-10-21 DIAGNOSIS — R300 Dysuria: Secondary | ICD-10-CM | POA: Diagnosis not present

## 2015-10-21 LAB — DIFFERENTIAL
Basophils Absolute: 0 10*3/uL (ref 0.0–0.1)
Basophils Relative: 0 %
EOS PCT: 1 %
Eosinophils Absolute: 0.1 10*3/uL (ref 0.0–0.7)
LYMPHS ABS: 1.2 10*3/uL (ref 0.7–4.0)
LYMPHS PCT: 15 %
Monocytes Absolute: 0.9 10*3/uL (ref 0.1–1.0)
Monocytes Relative: 12 %
NEUTROS PCT: 72 %
Neutro Abs: 5.6 10*3/uL (ref 1.7–7.7)

## 2015-10-21 LAB — COMPREHENSIVE METABOLIC PANEL
ALBUMIN: 4.3 g/dL (ref 3.5–5.0)
ALK PHOS: 51 U/L (ref 38–126)
ALT: 11 U/L — AB (ref 14–54)
ANION GAP: 7 (ref 5–15)
AST: 19 U/L (ref 15–41)
BILIRUBIN TOTAL: 0.4 mg/dL (ref 0.3–1.2)
BUN: 13 mg/dL (ref 6–20)
CALCIUM: 9.3 mg/dL (ref 8.9–10.3)
CO2: 27 mmol/L (ref 22–32)
CREATININE: 0.93 mg/dL (ref 0.44–1.00)
Chloride: 109 mmol/L (ref 101–111)
GFR calc Af Amer: >60 mL/min (ref >60)
GFR calc non Af Amer: 54 mL/min — ABNORMAL LOW (ref >60)
GLUCOSE: 97 mg/dL (ref 65–99)
Potassium: 3.7 mmol/L (ref 3.5–5.1)
SODIUM: 143 mmol/L (ref 135–145)
TOTAL PROTEIN: 7.3 g/dL (ref 6.5–8.1)

## 2015-10-21 LAB — URINE MICROSCOPIC-ADD ON

## 2015-10-21 LAB — CBG MONITORING, ED: Glucose-Capillary: 93 mg/dL (ref 65–99)

## 2015-10-21 LAB — CBC
HCT: 41 % (ref 36.0–46.0)
HEMOGLOBIN: 13.7 g/dL (ref 12.0–15.0)
MCH: 28.7 pg (ref 26.0–34.0)
MCHC: 33.4 g/dL (ref 30.0–36.0)
MCV: 86 fL (ref 78.0–100.0)
PLATELETS: 212 10*3/uL (ref 150–400)
RBC: 4.77 MIL/uL (ref 3.87–5.11)
RDW: 13.8 % (ref 11.5–15.5)
WBC: 7.9 10*3/uL (ref 4.0–10.5)

## 2015-10-21 LAB — URINALYSIS, ROUTINE W REFLEX MICROSCOPIC
Bilirubin Urine: NEGATIVE
GLUCOSE, UA: NEGATIVE mg/dL
HGB URINE DIPSTICK: NEGATIVE
Ketones, ur: NEGATIVE mg/dL
Nitrite: NEGATIVE
PH: 6 (ref 5.0–8.0)
PROTEIN: 30 mg/dL — AB
SPECIFIC GRAVITY, URINE: 1.015 (ref 1.005–1.030)

## 2015-10-21 LAB — I-STAT TROPONIN, ED: Troponin i, poc: 0 ng/mL (ref 0.00–0.08)

## 2015-10-21 MED ORDER — ROSUVASTATIN CALCIUM 10 MG PO TABS
10.0000 mg | ORAL_TABLET | Freq: Every day | ORAL | Status: DC
  Filled 2015-10-21: qty 1

## 2015-10-21 MED ORDER — ASPIRIN 300 MG RE SUPP: 300.0000 mg | Freq: Every day | RECTAL | Status: DC

## 2015-10-21 MED ORDER — STROKE: EARLY STAGES OF RECOVERY BOOK
Freq: Once | Status: AC
Start: 2015-10-21 — End: 2015-10-21
  Administered 2015-10-21: 22:00:00
  Filled 2015-10-21: qty 1

## 2015-10-21 MED ORDER — SODIUM CHLORIDE 0.9 % IV BOLUS (SEPSIS)
1000.0000 mL | Freq: Once | INTRAVENOUS | Status: AC
  Administered 2015-10-21: 1000 mL via INTRAVENOUS

## 2015-10-21 MED ORDER — ASPIRIN 325 MG PO TABS
325.0000 mg | ORAL_TABLET | Freq: Every day | ORAL | Status: DC
  Administered 2015-10-22: 325 mg via ORAL
  Filled 2015-10-21: qty 1

## 2015-10-21 MED ORDER — OLANZAPINE 10 MG PO TABS
10.0000 mg | ORAL_TABLET | Freq: Every day | ORAL | Status: DC
  Administered 2015-10-22: 10 mg via ORAL
  Filled 2015-10-21 (×2): qty 1

## 2015-10-21 MED ORDER — CLOPIDOGREL BISULFATE 75 MG PO TABS
75.0000 mg | ORAL_TABLET | Freq: Every day | ORAL | Status: DC
  Administered 2015-10-22: 75 mg via ORAL
  Filled 2015-10-21: qty 1

## 2015-10-21 MED ORDER — EZETIMIBE 10 MG PO TABS
5.0000 mg | ORAL_TABLET | Freq: Every day | ORAL | Status: DC
  Administered 2015-10-22: 5 mg via ORAL
  Filled 2015-10-21 (×2): qty 1

## 2015-10-21 MED ORDER — ESCITALOPRAM OXALATE 10 MG PO TABS
20.0000 mg | ORAL_TABLET | Freq: Every day | ORAL | Status: DC
  Administered 2015-10-22: 20 mg via ORAL
  Filled 2015-10-21: qty 2

## 2015-10-21 NOTE — ED Triage Notes (Signed)
Waiting for neuro consult

## 2015-10-21 NOTE — H&P (Addendum)
[facility] History and Physical  Allison [name] [id number] DOB: 1927-01-31 DOA: 10/21/2015   PCP: Horatio Pel, MD  Specialists: Previously seen by neurology  Chief Complaint: Difficulty speaking on Saturday  HPI: Allison [name] is a 82 y.o. 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 She did not require any neurosurgery at that time, but she was seen by neurosurgeon in consultation. Her Plavix was briefly discontinued. And now she is back on it. She was in her usual state of health till this past Saturday when she felt like she felt fatigued and then she was unable to communicate properly with her husband. She could think of the words, but she cannot articulate them. The symptoms lasted 5 minutes and then resolved. She continued to feel fatigued without any focal weakness. She rested at home on Sunday and on Monday decided to call her primary care physician's office who recommended that she come in to the emergency department. She hasn't had any further episodes of speech impairment. Denies any facial asymmetry. Denies any weakness in any one side of her body. Denies any tingling, numbness. No fever, chills. She has noticed some frequency with urination and some burning sensation with urination.   Home Medications: Prior to Admission medications   Medication Sig Start Date End Date Taking? Authorizing Provider  amLODipine (NORVASC) 5 MG tablet Take 5 mg by mouth every morning.    Yes Historical Provider, MD  Cholecalciferol (VITAMIN D PO) Take 1 tablet by mouth every morning.    Yes Historical Provider, MD  clopidogrel (PLAVIX) 75 MG tablet Take 75 mg by mouth daily. 09/10/15  Yes Historical Provider, MD  Coenzyme Q10 (CO Q-10) 50 MG CAPS  Take 1 capsule by mouth every morning. Pt states takes 30 mgs   Yes Historical Provider, MD  cycloSPORINE (RESTASIS) 0.05 % ophthalmic emulsion Place 1 drop into the left eye 2 (two) times daily.    Yes Historical Provider, MD  escitalopram (LEXAPRO) 20 MG tablet Take 20 mg by mouth daily.  07/06/14  Yes Historical Provider, MD  ezetimibe (ZETIA) 10 MG tablet Take 5 mg by mouth at bedtime.    Yes Historical Provider, MD  OLANZapine (ZYPREXA) 10 MG tablet Take 10 mg by mouth at bedtime.  07/07/14  Yes Historical Provider, MD  pantoprazole (PROTONIX) 40 MG tablet Take 40 mg by mouth every morning.    Yes Historical Provider, MD  Polyethyl Glycol-Propyl Glycol (SYSTANE FREE OP) Place 1 drop into the right eye daily as needed (dry eyes).    Yes Historical Provider, MD  rosuvastatin (CRESTOR) 10 MG tablet Take 10 mg by mouth daily.    Yes Historical Provider, MD  levETIRAcetam (KEPPRA) 500 MG tablet Take 1 tablet (500 mg total) by mouth 2 (two) times daily. Patient not taking: Reported on 10/21/2015 01/30/15   Lisette Abu, PA-C    Allergies:  Allergies  Allergen Reactions  . Lidocaine Hcl Shortness Of Breath  . Shellfish Allergy Shortness Of Breath and Rash    All seafood  . Xylocaine [Lidocaine] Shortness Of Breath  . Fentanyl Nausea And Vomiting  . Alprazolam Other (See Comments)  . Biaxin [Clarithromycin]     unknown  . Celebrex [Celecoxib]     Unknown   . Cozaar [Losartan]  High blood pressure  . Demerol [Meperidine]     unknown  . Diazepam Other (See Comments)  . Elavil [Amitriptyline]     unknown  . Erythromycin Other (See Comments)  . Fosamax [Alendronate]     unknown  . Gabapentin Other (See Comments)  . Hydromorphone     Hives   . Inderal [Propranolol]     unknown  . Levaquin [Levofloxacin]     unknown  . Macrodantin [Nitrofurantoin]     unknown  . Meclizine Hcl Other (See Comments)  . Mycinette [Phenol]     Unknown   . Neggram [Nalidixic Acid]     unknown  .  Novocain [Procaine]     unknown  . Pamelor [Nortriptyline]     unknown  . Robaxin [Methocarbamol]     unknown  . Tamiflu [Oseltamivir]     Unknown   . Tenormin [Atenolol]     unknown  . Triavil [Perphenazine-Amitriptyline]     Unknown   . Ampicillin Rash  . Iodine Rash  . Ioxaglate Rash    rash  . Ivp Dye [Iodinated Diagnostic Agents] Rash    rash  . Paxil [Paroxetine] Rash  . Penicillins Swelling and Rash    Rash Has patient had a PCN reaction causing immediate rash, facial/tongue/throat swelling, SOB or lightheadedness with hypotension: yes Has patient had a PCN reaction causing severe rash involving mucus membranes or skin necrosis: no Has patient had a PCN reaction that required hospitalization : yes Has patient had a PCN reaction occurring within the last 10 years: no If all of the above answers are "NO", then may proceed with Cephalosporin use.   . Wellbutrin [Bupropion] Rash  . Zoloft [Sertraline] Rash    Past Medical History: Past Medical History:  Diagnosis Date  . Allergy   . Anxiety   . Aortic aneurysm ([place])   . Appendicitis   . Arthritis   . Carotid artery plaque    hx of per left side   . Cataract   . Cholelithiasis   . Chronic kidney disease    hx of kidney stones  . Clotting disorder ([place])   . CVA (cerebral infarction)    Right retinal branch occlusion  . Depression   . Eustachian tube dysfunction    pt denies  . GERD (gastroesophageal reflux disease)   . HTN (hypertension)   . Internal hemorrhoids   . Irritable bowel syndrome   . Legally blind in right eye, as defined in [place]    related to past CVA  . Murmur, cardiac   . Osteoporosis   . Stroke ([place])   . Transient ischemic attack     Past Surgical History:  Procedure Laterality Date  . ABDOMINAL AORTIC ANEURYSM REPAIR  2005  . ABDOMINAL HYSTERECTOMY    . APPENDECTOMY    . CARPAL TUNNEL RELEASE Left 01/30/2015   Procedure: LEFT CARPAL TUNNEL RELEASE;  Surgeon: Charlotte Crumb, MD;   Location: [place];  Service: Orthopedics;  Laterality: Left;  . CHOLECYSTECTOMY    . CLOSED REDUCTION RADIAL SHAFT Right 01/30/2015   Procedure: CLOSED REDUCTION RADIAL FRACTURE;  Surgeon: Charlotte Crumb, MD;  Location: [place];  Service: Orthopedics;  Laterality: Right;  . EYE SURGERY     hx of lazy eye   . Kidney stones removed    . OPEN REDUCTION INTERNAL FIXATION (ORIF) DISTAL RADIAL FRACTURE Left 01/30/2015   Procedure: OPEN REDUCTION INTERNAL FIXATION (ORIF) LEFT DISTAL RADIUS FRACTURE;  Surgeon: Charlotte Crumb, MD;  Location: [facility]  OR;  Service: Orthopedics;  Laterality: Left;  . SHOULDER SURGERY     Left  . TEE WITHOUT CARDIOVERSION  12/18/2011   Procedure: TRANSESOPHAGEAL ECHOCARDIOGRAM (TEE);  Surgeon: Larey Dresser, MD;  Location: [place];  Service: Cardiovascular;  Laterality: N/A;  . WRIST SURGERY     Right    Social History: Lives with her husband in [place], [place]. She quit smoking in December 2016. Denies any alcohol use. No illicit drug use. Uses a cane or walker to ambulate.    Family History:  Family History  Problem Relation Age of Onset  . Diabetes Mother   . Coronary artery disease Mother     died age 33  . Coronary artery disease Father     died age 54  . Asthma Brother     died age 22  . Coronary artery disease Brother     died age 47  . Coronary artery disease Sister     died age 79     Review of Systems - History obtained from the patient General ROS: positive for  - fatigue Psychological ROS: negative Ophthalmic ROS: right eye blindness ENT ROS: negative Allergy and Immunology ROS: negative Hematological and Lymphatic ROS: negative Endocrine ROS: negative Respiratory ROS: no cough, shortness of breath, or wheezing Cardiovascular ROS: no chest pain or dyspnea on exertion Gastrointestinal ROS: no abdominal pain, change in bowel habits, or black or bloody stools Genito-Urinary ROS: as in hpi Musculoskeletal ROS:  negative Neurological ROS: as in hpi Dermatological ROS: negative  Physical Examination  Vitals:   10/21/15 1623 10/21/15 1639 10/21/15 1700 10/21/15 1730  BP:  117/91 (!) 122/49 110/87  Pulse: 61 65 61 60  Resp: 16 20 16 22   Temp:      TempSrc:      SpO2: 99% 98% 99% 97%    BP 110/87 Comment: Simultaneous filing. User may not have seen previous data.  Pulse 60 Comment: Simultaneous filing. User may not have seen previous data.  Temp 97.6 F (36.4 C) (Oral)   Resp 22 Comment: Simultaneous filing. User may not have seen previous data.  SpO2 97% Comment: Simultaneous filing. User may not have seen previous data.  General appearance: alert, cooperative, appears stated age and no distress Head: Normocephalic, without obvious abnormality, atraumatic Eyes: conjunctivae/corneas clear. PERRL, EOM's intact. Fundi benign. Throat: lips, mucosa, and tongue normal; teeth and gums normal Neck: no adenopathy, no carotid bruit, no JVD, supple, symmetrical, trachea midline and thyroid not enlarged, symmetric, no tenderness/mass/nodules Resp: clear to auscultation bilaterally Cardio: S1, S2 is normal, regular. No S3, S4. Systolic murmur appreciated over the precordium. No rubs, or bruit. GI: soft, non-tender; bowel sounds normal; no masses,  no organomegaly Extremities: extremities normal, atraumatic, no cyanosis or edema Pulses: 2+ and symmetric Skin: Skin color, texture, turgor normal. No rashes or lesions Lymph nodes: Cervical, supraclavicular, and axillary nodes normal. Neurologic: Cranial nerves II-12 intact. Tongue is midline. Motor strength equal bilateral upper and lower extremities.   Labs on Admission: I have personally reviewed following labs and imaging studies  CBC:  Recent Labs Lab 10/21/15 1212  WBC 7.9  NEUTROABS 5.6  HGB 13.7  HCT 41.0  MCV 86.0  PLT 99991111   Basic Metabolic Panel:  Recent Labs Lab 10/21/15 1212  NA 143  K 3.7  CL 109  CO2 27  GLUCOSE 97  BUN  13  CREATININE 0.93  CALCIUM 9.3   GFR: CrCl cannot be calculated (Unknown ideal weight.). Liver Function  Tests:  Recent Labs Lab 10/21/15 1212  AST 19  ALT 11*  ALKPHOS 51  BILITOT 0.4  PROT 7.3  ALBUMIN 4.3   CBG:  Recent Labs Lab 10/21/15 1214  GLUCAP 93     Radiological Exams on Admission: Ct Head Wo Contrast  Result Date: 10/21/2015 CLINICAL DATA:  Weakness and dizziness for 2 days. Unable to talk for a period of time on Saturday. Stroke. EXAM: CT HEAD WITHOUT CONTRAST TECHNIQUE: Contiguous axial images were obtained from the base of the skull through the vertex without intravenous contrast. COMPARISON:  CT head without contrast 01/28/2015. FINDINGS: Brain: Previously noted anterior left frontal lobe hemorrhage has evolved to encephalomalacia. There is persistent asymmetric sulcal effacement over the high a left parietal and frontal lobes at the vertex. No other mass effect is evident. Moderate generalized atrophy and white matter disease is noted. Ventricles are proportionate to the degree of atrophy. A remote lacunar infarct is present in the left thalamus. The basal ganglia are otherwise intact. The insular ribbon is normal. Remote lacunar infarcts are present within the cerebellum. The brainstem and cerebellum are otherwise normal. Vascular: Dense atherosclerotic calcifications are present within the cavernous internal carotid arteries and at the left vertebral artery at C1. No focal hyperdense vessel is present. Skull: The calvarium is intact. Sinuses/Orbits: The visualized paranasal sinuses and mastoid air cells are clear. The globes and orbits are within normal limits. Other: IMPRESSION: 1. Expected evolution of encephalomalacia within the anterior left frontal lobe. 2. Otherwise stable atrophy and diffuse white matter disease. 3. No acute intracranial abnormality. 4. Stable chronic asymmetric prominence of the sulci in the high right frontal and parietal lobes. 5.  Atherosclerosis Electronically Signed   By: San Morelle M.D.   On: 10/21/2015 12:39    My interpretation of Electrocardiogram: Sinus rhythm at 60 bpm. Normal axis. Intervals are normal. No concerning ST or T-wave changes.   Problem List  Principal Problem:   TIA (transient ischemic attack) Active Problems:   Depression   History of stroke   Assessment: This is a 82 year old Caucasian female with past medical history as stated earlier, who presents after she experienced a 5 minute episode on Saturday when she could not speak properly. Hasn't had any further symptoms since then. She has a complicated neurological history with history of embolic stroke resulting in right eye blindness. She's had TIAs in the past with last hospitalization in 2016. She sustained trauma in December 2016 with cerebral hemorrhage apart from other injuries.  Plan: #1 Likely TIA with transient aphasia: Due to her complicated neurological history patient merits observation in the hospital for further evaluation. We will order MRI brain, MRA head. Carotid Dopplers. EKG shows sinus rhythm. Check lipid panel. PT and OT evaluation. Swallow screen. She will matter from being evaluated by the stroke service, so she'll be transferred to [facility]. Continue Plavix for now. Will add aspirin for now. Discussed with neuro-hospitalist, who agrees and will see the patient in consultation today.  #2 Trauma in December 2016 resulting in cerebral hemorrhage: No evidence for any hemorrhage on today's CT scan.  #3 history of essential hypertension: Monitor blood pressures closely. Hold her oral agents for now.  #4 history of depression: Continue with her home medications.  #5 history of subclavian steal syndrome: Patient was supposed to follow-up with vascular surgery. Unclear if she did, so not.  #6 Dysuria: Patient's UA was reviewed and is not conclusive for infection. Hold off on antibiotics for now.  DVT  Prophylaxis: SCDs Code Status: Full code Family Communication: No family at bedside. ED physician did have a long conversation with her son who is an Administrator, [employer] in [place]  Disposition Plan: Transfer to [facility] to telemetry  Consults called: Neurology  Admission status: Observation    Further management decisions will depend on results of further testing and patient's response to treatment.   [facility]  [facility] Pager [phone]  If 7PM-7AM, please contact night-coverage [email or web address] Password [facility]  10/21/2015, 5:57 PM

## 2015-10-21 NOTE — ED Triage Notes (Signed)
Pt  Given update about admission, also soup and juice provided. Ambulated to BR with mim assistance

## 2015-10-21 NOTE — ED Notes (Signed)
Pt offered sandwich, pt refused, requested "soup or something else".  Pt offered Mac & cheese or Tomato soup.  Pt chose & was provided the Mac & cheese, Sprite also provided.

## 2015-10-21 NOTE — ED Triage Notes (Signed)
Pt in CT.

## 2015-10-21 NOTE — ED Triage Notes (Signed)
Pt c/o weakness and dizziness since Saturday. Per family, pt was unable to talk on Saturday. Pt sts "I had a stroke on Saturday." Pt has hx of strokes. Denies any deficits from previous events. Pt is able to talk in triage and sts that she has been able to since Saturday night. Pt called doctor this morning and was told to come here for CT. A&Ox4. Denies worsening weakness on one side, sensation equal bilaterally, no obvious facial droop or slurred speech.

## 2015-10-21 NOTE — Progress Notes (Signed)
Arrived from [facility] at 2220. Alert and oriented. Denies any pain. Safety measure in place.

## 2015-10-21 NOTE — ED Notes (Signed)
Pt assisted to BR with use of [place].

## 2015-10-21 NOTE — ED Provider Notes (Signed)
[facility] DEPT Provider Note   CSN: [id number] Arrival date & time: 10/21/15  1144     History   Chief Complaint Chief Complaint  Patient presents with  . Weakness  . Dizziness    HPI Allison [name] is a 82 y.o. female.  HPI 82 year old female with past medical history of hypertension, hyperlipidemia, history of recurrent TIAs as well as CVA who presents with transient aphasia. The patient states that she was in her usual state of health on Saturday when she experienced acute onset of difficulty expressing herself. She states that she was fully aware that she was speaking nonsense, but was unable to save the right words. Her words were legible and she denies any dysarthria. She was able to understand her husband during this episode. This episode lasted about an hour and then resolved completely. Since then she has had generalized fatigue as well as increased confusion according to her caretaker. No fevers or chills. No recent falls, no dysuria, hematuria or flank pain. No chest pain, cough or shortness of breath.    Past Medical History:  Diagnosis Date  . Allergy   . Anxiety   . Aortic aneurysm ([place])   . Appendicitis   . Arthritis   . Carotid artery plaque    hx of per left side   . Cataract   . Cholelithiasis   . Chronic kidney disease    hx of kidney stones  . Clotting disorder ([place])   . CVA (cerebral infarction)    Right retinal branch occlusion  . Depression   . Eustachian tube dysfunction    pt denies  . GERD (gastroesophageal reflux disease)   . HTN (hypertension)   . Internal hemorrhoids   . Irritable bowel syndrome   . Legally blind in right eye, as defined in [place]    related to past CVA  . Murmur, cardiac   . Osteoporosis   . Stroke ([place])   . Transient ischemic attack     Patient Active Problem List   Diagnosis Date Noted  . History of stroke 10/21/2015  . Fall 01/30/2015  . TBI (traumatic brain injury) ([place]) 01/30/2015  . Multiple facial  fractures ([place]) 01/30/2015  . Wrist fracture, bilateral 01/30/2015  . Facial laceration 01/30/2015  . Frontal lobe contusion ([place]) 01/25/2015  . Palpitations   . Cerebral infarction due to unspecified mechanism   . TIA (transient ischemic attack) 04/15/2014  . Weakness 04/14/2014  . CVA (cerebral infarction) 04/14/2014  . Dizziness and giddiness   . Dysuria 10/08/2011  . HTN (hypertension) 10/07/2011  . Embolic stroke ([place]) [id number]  . acute Blindness  right eye 10/07/2011  . Hyperlipemia 10/07/2011  . Depression 10/07/2011  . PUD (peptic ulcer disease) 10/07/2011  . Vasculopathy 10/07/2011  . Tobacco abuse 10/07/2011  . Retinal artery branch occlusion of right eye 10/07/2011  . Internal hemorrhoids   . Cholelithiasis     Past Surgical History:  Procedure Laterality Date  . ABDOMINAL AORTIC ANEURYSM REPAIR  2005  . ABDOMINAL HYSTERECTOMY    . APPENDECTOMY    . CARPAL TUNNEL RELEASE Left 01/30/2015   Procedure: LEFT CARPAL TUNNEL RELEASE;  Surgeon: Charlotte Crumb, MD;  Location: [facility];  Service: Orthopedics;  Laterality: Left;  . CHOLECYSTECTOMY    . CLOSED REDUCTION RADIAL SHAFT Right 01/30/2015   Procedure: CLOSED REDUCTION RADIAL FRACTURE;  Surgeon: Charlotte Crumb, MD;  Location: [facility];  Service: Orthopedics;  Laterality: Right;  . EYE SURGERY     hx of  lazy eye   . Kidney stones removed    . OPEN REDUCTION INTERNAL FIXATION (ORIF) DISTAL RADIAL FRACTURE Left 01/30/2015   Procedure: OPEN REDUCTION INTERNAL FIXATION (ORIF) LEFT DISTAL RADIUS FRACTURE;  Surgeon: Charlotte Crumb, MD;  Location: [facility];  Service: Orthopedics;  Laterality: Left;  . SHOULDER SURGERY     Left  . TEE WITHOUT CARDIOVERSION  12/18/2011   Procedure: TRANSESOPHAGEAL ECHOCARDIOGRAM (TEE);  Surgeon: Larey Dresser, MD;  Location: [facility];  Service: Cardiovascular;  Laterality: N/A;  . WRIST SURGERY     Right    OB History    No data available       Home Medications    Prior to  Admission medications   Medication Sig Start Date End Date Taking? Authorizing Provider  amLODipine (NORVASC) 5 MG tablet Take 5 mg by mouth every morning.    Yes Historical Provider, MD  Cholecalciferol (VITAMIN D PO) Take 1 tablet by mouth every morning.    Yes Historical Provider, MD  clopidogrel (PLAVIX) 75 MG tablet Take 75 mg by mouth daily. 09/10/15  Yes Historical Provider, MD  Coenzyme Q10 (CO Q-10) 50 MG CAPS Take 1 capsule by mouth every morning. Pt states takes 30 mgs   Yes Historical Provider, MD  cycloSPORINE (RESTASIS) 0.05 % ophthalmic emulsion Place 1 drop into the left eye 2 (two) times daily.    Yes Historical Provider, MD  escitalopram (LEXAPRO) 20 MG tablet Take 20 mg by mouth daily.  07/06/14  Yes Historical Provider, MD  ezetimibe (ZETIA) 10 MG tablet Take 5 mg by mouth at bedtime.    Yes Historical Provider, MD  OLANZapine (ZYPREXA) 10 MG tablet Take 10 mg by mouth at bedtime.  07/07/14  Yes Historical Provider, MD  pantoprazole (PROTONIX) 40 MG tablet Take 40 mg by mouth every morning.    Yes Historical Provider, MD  Polyethyl Glycol-Propyl Glycol (SYSTANE FREE OP) Place 1 drop into the right eye daily as needed (dry eyes).    Yes Historical Provider, MD  rosuvastatin (CRESTOR) 10 MG tablet Take 10 mg by mouth daily.    Yes Historical Provider, MD  levETIRAcetam (KEPPRA) 500 MG tablet Take 1 tablet (500 mg total) by mouth 2 (two) times daily. Patient not taking: Reported on 10/21/2015 01/30/15   Lisette Abu, PA-C    Family History Family History  Problem Relation Age of Onset  . Diabetes Mother   . Coronary artery disease Mother     died age 62  . Coronary artery disease Father     died age 31  . Asthma Brother     died age 68  . Coronary artery disease Brother     died age 12  . Coronary artery disease Sister     died age 36    Social History Social History  Substance Use Topics  . Smoking status: Former Smoker    Packs/day: 0.50    Years: 20.00     Types: Cigarettes    Quit date: 04/14/2014  . Smokeless tobacco: Never Used  . Alcohol use No     Allergies   Lidocaine hcl; Shellfish allergy; Xylocaine [lidocaine]; Fentanyl; Alprazolam; Biaxin [clarithromycin]; Celebrex [celecoxib]; Cozaar [losartan]; Demerol [meperidine]; Diazepam; Elavil [amitriptyline]; Erythromycin; Fosamax [alendronate]; Gabapentin; Hydromorphone; Inderal [propranolol]; Levaquin [levofloxacin]; Macrodantin [nitrofurantoin]; Meclizine hcl; Mycinette [phenol]; Neggram [nalidixic acid]; Novocain [procaine]; Pamelor [nortriptyline]; Robaxin [methocarbamol]; Tamiflu [oseltamivir]; Tenormin [atenolol]; Triavil [perphenazine-amitriptyline]; Ampicillin; Iodine; Ioxaglate; Ivp dye [iodinated diagnostic agents]; Paxil [paroxetine]; Penicillins; Wellbutrin [bupropion]; and Zoloft [sertraline]  Review of Systems Review of Systems  Constitutional: Positive for fatigue. Negative for chills and fever.  HENT: Negative for congestion, rhinorrhea and sore throat.   Eyes: Negative for visual disturbance.  Respiratory: Negative for cough, shortness of breath and wheezing.   Cardiovascular: Negative for chest pain and leg swelling.  Gastrointestinal: Negative for abdominal pain, diarrhea, nausea and vomiting.  Genitourinary: Negative for dysuria, flank pain, vaginal bleeding and vaginal discharge.  Musculoskeletal: Negative for neck pain.  Skin: Negative for rash.  Allergic/Immunologic: Negative for immunocompromised state.  Neurological: Positive for speech difficulty. Negative for syncope and headaches.  Hematological: Does not bruise/bleed easily.  All other systems reviewed and are negative.    Physical Exam Updated Vital Signs BP 108/72   Pulse 62   Temp 97.9 F (36.6 C)   Resp 19   SpO2 96%   Physical Exam  Constitutional: She is oriented to person, place, and time. She appears well-developed and well-nourished. No distress.  HENT:  Head: Normocephalic and  atraumatic.  Eyes: Conjunctivae are normal.  Neck: Neck supple.  Cardiovascular: Normal rate, regular rhythm and normal heart sounds.  Exam reveals no friction rub.   No murmur heard. Pulmonary/Chest: Effort normal and breath sounds normal. No respiratory distress. She has no wheezes. She has no rales.  Abdominal: She exhibits no distension.  Musculoskeletal: She exhibits no edema.  Neurological: She is alert and oriented to person, place, and time. She exhibits normal muscle tone.  Skin: Skin is warm. Capillary refill takes less than 2 seconds.  Psychiatric: She has a normal mood and affect.  Nursing note and vitals reviewed.   Neurological Exam:  Mental Status: Alert and oriented to person, place, and time. Attention and concentration normal. Speech clear. Recent memory is intact. Cranial Nerves: Visual fields intact to confrontation in all quadrants bilaterally. EOMI and PERRLA. No nystagmus noted. Facial sensation intact at forehead, maxillary cheek, and chin/mandible bilaterally. No weakness of masticatory muscles. No facial asymmetry or weakness. Hearing grossly normal to finer rub. Uvula is midline, and palate elevates symmetrically. Normal SCM and trapezius strength. Tongue midline without fasciculations Motor: Muscle strength 5/5 in proximal and distal UE and LE bilaterally. No pronator drift. Muscle tone normal. Reflexes: 2+ and symmetrical in all four extremities.  Sensation: Intact to light touch in upper and lower extremities distally bilaterally.  Gait: Deferred Coordination: Normal FTN bilaterally.  ED Treatments / Results  Labs (all labs ordered are listed, but only abnormal results are displayed) Labs Reviewed  COMPREHENSIVE METABOLIC PANEL - Abnormal; Notable for the following:       Result Value   ALT 11 (*)    GFR calc non Af Amer 54 (*)    All other components within normal limits  URINALYSIS, ROUTINE W REFLEX MICROSCOPIC (NOT AT [facility]) - Abnormal; Notable for the  following:    Protein, ur 30 (*)    Leukocytes, UA TRACE (*)    All other components within normal limits  URINE MICROSCOPIC-ADD ON - Abnormal; Notable for the following:    Squamous Epithelial / LPF 0-5 (*)    Bacteria, UA RARE (*)    All other components within normal limits  CBC  DIFFERENTIAL  I-STAT TROPOININ, ED  CBG MONITORING, ED    EKG  EKG Interpretation None       Radiology Ct Head Wo Contrast  Result Date: 10/21/2015 CLINICAL DATA:  Weakness and dizziness for 2 days. Unable to talk for a period of time on Saturday. Stroke. EXAM:  CT HEAD WITHOUT CONTRAST TECHNIQUE: Contiguous axial images were obtained from the base of the skull through the vertex without intravenous contrast. COMPARISON:  CT head without contrast 01/28/2015. FINDINGS: Brain: Previously noted anterior left frontal lobe hemorrhage has evolved to encephalomalacia. There is persistent asymmetric sulcal effacement over the high a left parietal and frontal lobes at the vertex. No other mass effect is evident. Moderate generalized atrophy and white matter disease is noted. Ventricles are proportionate to the degree of atrophy. A remote lacunar infarct is present in the left thalamus. The basal ganglia are otherwise intact. The insular ribbon is normal. Remote lacunar infarcts are present within the cerebellum. The brainstem and cerebellum are otherwise normal. Vascular: Dense atherosclerotic calcifications are present within the cavernous internal carotid arteries and at the left vertebral artery at C1. No focal hyperdense vessel is present. Skull: The calvarium is intact. Sinuses/Orbits: The visualized paranasal sinuses and mastoid air cells are clear. The globes and orbits are within normal limits. Other: IMPRESSION: 1. Expected evolution of encephalomalacia within the anterior left frontal lobe. 2. Otherwise stable atrophy and diffuse white matter disease. 3. No acute intracranial abnormality. 4. Stable chronic  asymmetric prominence of the sulci in the high right frontal and parietal lobes. 5. Atherosclerosis Electronically Signed   By: San Morelle M.D.   On: 10/21/2015 12:39    Procedures Procedures (including critical care time)  Medications Ordered in ED Medications  sodium chloride 0.9 % bolus 1,000 mL (0 mLs Intravenous Stopped 10/21/15 1458)     Initial Impression / Assessment and Plan / ED Course  I have reviewed the triage vital signs and the nursing notes.  Pertinent labs & imaging results that were available during my care of the patient were reviewed by me and considered in my medical decision making (see chart for details).  Clinical Course    82 year old female with past medical history of hypertension, hyperlipidemia, and recurrent TIAs who presents with transient episode of expressive aphasia on Saturday. Symptoms are now resolved. CT head is negative. Screening lab work shows no evidence of etiology. No UTI or pneumonia. EKG is nonischemic. No apparent electrolytes abnormality. Discussed case with neurology. Will admit for MRI/MRA and further workup. I also discussed with the patient's son, who is in agreement with this plan. He is a Engineer, drilling in [place], [place]. Will transfer to [facility] per hospitalist for admission.   Final Clinical Impressions(s) / ED Diagnoses   Final diagnoses:  Transient cerebral ischemia, unspecified transient cerebral ischemia type      Duffy Bruce, MD 10/21/15 2006

## 2015-10-21 NOTE — ED Notes (Signed)
Pt transported to [facility] via [facility] 

## 2015-10-21 NOTE — ED Notes (Signed)
Pt is A&Ox4 

## 2015-10-21 NOTE — ED Notes (Signed)
Informed receiving RN, Hin, @ [facility], pt has passed swallow screen.  Hin requested pt be given sandwich prior to transporting.

## 2015-10-22 ENCOUNTER — Observation Stay (HOSPITAL_BASED_OUTPATIENT_CLINIC_OR_DEPARTMENT_OTHER): Payer: Medicare Other

## 2015-10-22 ENCOUNTER — Observation Stay (HOSPITAL_COMMUNITY): Payer: Medicare Other

## 2015-10-22 ENCOUNTER — Ambulatory Visit (HOSPITAL_BASED_OUTPATIENT_CLINIC_OR_DEPARTMENT_OTHER): Payer: Medicare Other

## 2015-10-22 DIAGNOSIS — Z8673 Personal history of transient ischemic attack (TIA), and cerebral infarction without residual deficits: Secondary | ICD-10-CM | POA: Diagnosis not present

## 2015-10-22 DIAGNOSIS — G459 Transient cerebral ischemic attack, unspecified: Secondary | ICD-10-CM

## 2015-10-22 DIAGNOSIS — R4789 Other speech disturbances: Secondary | ICD-10-CM | POA: Diagnosis not present

## 2015-10-22 DIAGNOSIS — R4701 Aphasia: Secondary | ICD-10-CM | POA: Diagnosis not present

## 2015-10-22 LAB — URINE MICROSCOPIC-ADD ON: Bacteria, UA: NONE SEEN

## 2015-10-22 LAB — VAS US CAROTID
LCCADDIAS: -9 cm/s
LCCAPDIAS: -6 cm/s
LCCAPSYS: -72 cm/s
LEFT ECA DIAS: -1 cm/s
LEFT VERTEBRAL DIAS: 0 cm/s
LICADDIAS: -15 cm/s
LICADSYS: -72 cm/s
LICAPSYS: -91 cm/s
Left CCA dist sys: -85 cm/s
Left ICA prox dias: -13 cm/s
RIGHT ECA DIAS: 0 cm/s
RIGHT VERTEBRAL DIAS: 12 cm/s
Right CCA prox dias: -6 cm/s
Right CCA prox sys: -93 cm/s
Right cca dist sys: 76 cm/s

## 2015-10-22 LAB — URINALYSIS, ROUTINE W REFLEX MICROSCOPIC
BILIRUBIN URINE: NEGATIVE
Glucose, UA: NEGATIVE mg/dL
HGB URINE DIPSTICK: NEGATIVE
Ketones, ur: NEGATIVE mg/dL
Leukocytes, UA: NEGATIVE
Nitrite: NEGATIVE
Protein, ur: 100 mg/dL — AB
SPECIFIC GRAVITY, URINE: 1.012 (ref 1.005–1.030)
pH: 7 (ref 5.0–8.0)

## 2015-10-22 LAB — ECHOCARDIOGRAM COMPLETE
Height: 61.5 in
Weight: 2091.2 oz

## 2015-10-22 NOTE — Evaluation (Signed)
Speech Language Pathology Evaluation Patient Details Name: Allison [name] MRN: [id number] DOB: 05/27/1926 Today's Date: 10/22/2015 Time: 1020-1040 SLP Time Calculation (min) (ACUTE ONLY): 20 min  Problem List:  Patient Active Problem List   Diagnosis Date Noted  . History of stroke 10/21/2015  . Fall 01/30/2015  . TBI (traumatic brain injury) ([place]) 01/30/2015  . Multiple facial fractures ([place]) 01/30/2015  . Wrist fracture, bilateral 01/30/2015  . Facial laceration 01/30/2015  . Frontal lobe contusion ([place]) 01/25/2015  . Palpitations   . Cerebral infarction due to unspecified mechanism   . TIA (transient ischemic attack) 04/15/2014  . Weakness 04/14/2014  . CVA (cerebral infarction) 04/14/2014  . Dizziness and giddiness   . Dysuria 10/08/2011  . HTN (hypertension) 10/07/2011  . Embolic stroke ([place]) [id number]  . acute Blindness  right eye 10/07/2011  . Hyperlipemia 10/07/2011  . Depression 10/07/2011  . PUD (peptic ulcer disease) 10/07/2011  . Vasculopathy 10/07/2011  . Tobacco abuse 10/07/2011  . Retinal artery branch occlusion of right eye 10/07/2011  . Internal hemorrhoids   . Cholelithiasis    Past Medical History:  Past Medical History:  Diagnosis Date  . Allergy   . Anxiety   . Aortic aneurysm ([place])   . Appendicitis   . Arthritis   . Carotid artery plaque    hx of per left side   . Cataract   . Cholelithiasis   . Chronic kidney disease    hx of kidney stones  . Clotting disorder ([place])   . CVA (cerebral infarction)    Right retinal branch occlusion  . Depression   . Eustachian tube dysfunction    pt denies  . GERD (gastroesophageal reflux disease)   . HTN (hypertension)   . Internal hemorrhoids   . Irritable bowel syndrome   . Legally blind in right eye, as defined in [place]    related to past CVA  . Murmur, cardiac   . Osteoporosis   . Stroke ([place])   . Transient ischemic attack    Past Surgical History:  Past Surgical History:  Procedure  Laterality Date  . ABDOMINAL AORTIC ANEURYSM REPAIR  2005  . ABDOMINAL HYSTERECTOMY    . APPENDECTOMY    . CARPAL TUNNEL RELEASE Left 01/30/2015   Procedure: LEFT CARPAL TUNNEL RELEASE;  Surgeon: Charlotte Crumb, MD;  Location: [facility];  Service: Orthopedics;  Laterality: Left;  . CHOLECYSTECTOMY    . CLOSED REDUCTION RADIAL SHAFT Right 01/30/2015   Procedure: CLOSED REDUCTION RADIAL FRACTURE;  Surgeon: Charlotte Crumb, MD;  Location: [facility];  Service: Orthopedics;  Laterality: Right;  . EYE SURGERY     hx of lazy eye   . Kidney stones removed    . OPEN REDUCTION INTERNAL FIXATION (ORIF) DISTAL RADIAL FRACTURE Left 01/30/2015   Procedure: OPEN REDUCTION INTERNAL FIXATION (ORIF) LEFT DISTAL RADIUS FRACTURE;  Surgeon: Charlotte Crumb, MD;  Location: [facility];  Service: Orthopedics;  Laterality: Left;  . SHOULDER SURGERY     Left  . TEE WITHOUT CARDIOVERSION  12/18/2011   Procedure: TRANSESOPHAGEAL ECHOCARDIOGRAM (TEE);  Surgeon: Larey Dresser, MD;  Location: [facility];  Service: Cardiovascular;  Laterality: N/A;  . WRIST SURGERY     Right   HPI:  Allison [name] a 82 y.o.femalewith a past medical history of embolic stroke in the past with right retinal artery occlusion, history of previous TIAs, subclavian steal syndrome, hypertension, who was hospitalized in December 2016 after trauma to multiple areas of the body, including a cerebral hemorrhage.  Pt admitted 9/19 for difficulty speaking on Saturday. MRI negative for any acute infarct. Dx with TIA with transient aphasia.    Assessment / Plan / Recommendation Clinical Impression  Patient presents with mild short term memory impairment which patient reports as new this admission. Otherwise, expressive and receptive language [facility] and patient without other notable deficits. Given independent nature prior to admission, recommend short term SLP (either HH or SNF depending on availability fo brief 24 hour supervision) after d/c. Discussed with  patient who is in agreement.     SLP Assessment  All further Speech Lanaguage Pathology  needs can be addressed in the next venue of care    Follow Up Recommendations  Home health SLP (if 24 hour supervision, SNF otherwise)    Frequency and Duration           SLP Evaluation Cognition  Overall Cognitive Status: No family/caregiver present to determine baseline cognitive functioning Arousal/Alertness: Awake/alert Orientation Level: Oriented X4 Attention: Selective Selective Attention: Appears intact Memory: Impaired Memory Impairment: Storage deficit;Decreased recall of new information (mild) Awareness: Appears intact Problem Solving: Appears intact Safety/Judgment: Appears intact       Comprehension  Auditory Comprehension Overall Auditory Comprehension: Appears within functional limits for tasks assessed Visual Recognition/Discrimination Discrimination: Within Function Limits Reading Comprehension Reading Status: Not tested    Expression Expression Primary Mode of Expression: Verbal Verbal Expression Overall Verbal Expression: Appears within functional limits for tasks assessed Written Expression Dominant Hand: Right   Oral / Motor  Oral Motor/Sensory Function Overall Oral Motor/Sensory Function: Within functional limits Motor Speech Overall Motor Speech: Appears within functional limits for tasks assessed   GO          Functional Assessment Tool Used: skilled clinical judgement Functional Limitations: Memory Memory Current Status [id number]): At least 1 percent but less than 20 percent impaired, limited or restricted Memory Goal Status [id number]): At least 1 percent but less than 20 percent impaired, limited or restricted Memory Discharge Status [phone]): At least 1 percent but less than 20 percent impaired, limited or restricted        [place], [place] [phone]  Gabriel Rainwater Meryl 10/22/2015, 12:14 PM

## 2015-10-22 NOTE — Progress Notes (Signed)
Echocardiogram 2D Echocardiogram has been performed.  Allison [name] 10/22/2015, 1:22 PM

## 2015-10-22 NOTE — Consult Note (Signed)
NEURO HOSPITALIST CONSULT NOTE   Requestig physician: Dr. Darrick Meigs   Reason for Consult: TIA   History obtained from:  Patient     HPI:                                                                                                                                          Allison [name] is an 82 y.o. 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 Comes to the hospital because of a brief episode of expressive aphasia as described by herself and family. She could think of the words, but she cannot articulate them. The symptoms lasted 5 minutes and then resolved.  She rested at home on Sunday and on Monday decided to call her primary care physician's office who recommended that she come in to the emergency department. She hasn't had any further episodes.  Past Medical History:  Diagnosis Date  . Allergy   . Anxiety   . Aortic aneurysm ([place])   . Appendicitis   . Arthritis   . Carotid artery plaque    hx of per left side   . Cataract   . Cholelithiasis   . Chronic kidney disease    hx of kidney stones  . Clotting disorder ([place])   . CVA (cerebral infarction)    Right retinal branch occlusion  . Depression   . Eustachian tube dysfunction    pt denies  . GERD (gastroesophageal reflux disease)   . HTN (hypertension)   . Internal hemorrhoids   . Irritable bowel syndrome   . Legally blind in right eye, as defined in [place]    related to past CVA  . Murmur, cardiac   . Osteoporosis   . Stroke ([place])   . Transient ischemic attack     Past Surgical History:  Procedure Laterality Date  . ABDOMINAL AORTIC ANEURYSM REPAIR  2005  . ABDOMINAL HYSTERECTOMY    . APPENDECTOMY    . CARPAL TUNNEL RELEASE Left 01/30/2015   Procedure: LEFT CARPAL  TUNNEL RELEASE;  Surgeon: Charlotte Crumb, MD;  Location: [facility];  Service: Orthopedics;  Laterality: Left;  . CHOLECYSTECTOMY    . CLOSED REDUCTION RADIAL SHAFT Right 01/30/2015   Procedure: CLOSED REDUCTION RADIAL FRACTURE;  Surgeon: Charlotte Crumb, MD;  Location: [facility];  Service: Orthopedics;  Laterality: Right;  . EYE SURGERY     hx of lazy eye   . Kidney stones removed    . OPEN REDUCTION INTERNAL FIXATION (ORIF) DISTAL RADIAL FRACTURE Left 01/30/2015   Procedure: OPEN REDUCTION INTERNAL FIXATION (ORIF) LEFT DISTAL RADIUS FRACTURE;  Surgeon: Charlotte Crumb, MD;  Location: [facility];  Service: Orthopedics;  Laterality: Left;  . SHOULDER SURGERY     Left  . TEE WITHOUT CARDIOVERSION  12/18/2011   Procedure: TRANSESOPHAGEAL ECHOCARDIOGRAM (TEE);  Surgeon: Larey Dresser, MD;  Location: [facility];  Service: Cardiovascular;  Laterality: N/A;  . WRIST SURGERY     Right    Family History  Problem Relation Age of Onset  . Diabetes Mother   . Coronary artery disease Mother     died age 57  . Coronary artery disease Father     died age 30  . Asthma Brother     died age 12  . Coronary artery disease Brother     died age 46  . Coronary artery disease Sister     died age 97      Social History:  reports that she quit smoking about 18 months ago. Her smoking use included Cigarettes. She has a 10.00 pack-year smoking history. She has never used smokeless tobacco. She reports that she does not drink alcohol or use drugs.  Allergies  Allergen Reactions  . Lidocaine Hcl Shortness Of Breath  . Shellfish Allergy Shortness Of Breath and Rash    All seafood  . Xylocaine [Lidocaine] Shortness Of Breath  . Fentanyl Nausea And Vomiting  . Alprazolam Other (See Comments)  . Biaxin [Clarithromycin]     unknown  . Celebrex [Celecoxib]     Unknown   . Cozaar [Losartan]     High blood pressure  . Demerol [Meperidine]     unknown  . Diazepam Other (See Comments)  . Elavil  [Amitriptyline]     unknown  . Erythromycin Other (See Comments)  . Fosamax [Alendronate]     unknown  . Gabapentin Other (See Comments)  . Hydromorphone     Hives   . Inderal [Propranolol]     unknown  . Levaquin [Levofloxacin]     unknown  . Macrodantin [Nitrofurantoin]     unknown  . Meclizine Hcl Other (See Comments)  . Mycinette [Phenol]     Unknown   . Neggram [Nalidixic Acid]     unknown  . Novocain [Procaine]     unknown  . Pamelor [Nortriptyline]     unknown  . Robaxin [Methocarbamol]     unknown  . Tamiflu [Oseltamivir]     Unknown   . Tenormin [Atenolol]     unknown  . Triavil [Perphenazine-Amitriptyline]     Unknown   . Ampicillin Rash  . Iodine Rash  . Ioxaglate Rash    rash  . Ivp Dye [Iodinated Diagnostic Agents] Rash    rash  . Paxil [Paroxetine] Rash  . Penicillins Swelling and Rash    Rash Has patient had a PCN reaction causing immediate rash, facial/tongue/throat swelling, SOB or lightheadedness with hypotension: yes Has patient had a PCN reaction causing severe rash involving mucus membranes or skin necrosis: no Has patient had a PCN reaction that required hospitalization : yes Has patient had a PCN reaction occurring within the last 10 years: no If all of the above answers are "NO", then may proceed with Cephalosporin use.   . Wellbutrin [Bupropion] Rash  . Zoloft [Sertraline] Rash    MEDICATIONS:  I have reviewed the patient's current medications.   ROS:                                                                                                                                       History obtained from chart review and the patient  General ROS: negative for - chills, fatigue, fever, night sweats, weight gain or weight loss Psychological ROS: negative for - behavioral disorder, hallucinations, memory difficulties, mood  swings or suicidal ideation Ophthalmic ROS: negative for - blurry vision, double vision, eye pain or loss of vision ENT ROS: negative for - epistaxis, nasal discharge, oral lesions, sore throat, tinnitus or vertigo Allergy and Immunology ROS: negative for - hives or itchy/watery eyes Hematological and Lymphatic ROS: negative for - bleeding problems, bruising or swollen lymph nodes Endocrine ROS: negative for - galactorrhea, hair pattern changes, polydipsia/polyuria or temperature intolerance Respiratory ROS: negative for - cough, hemoptysis, shortness of breath or wheezing Cardiovascular ROS: negative for - chest pain, dyspnea on exertion, edema or irregular heartbeat Gastrointestinal ROS: negative for - abdominal pain, diarrhea, hematemesis, nausea/vomiting or stool incontinence Genito-Urinary ROS: negative for - dysuria, hematuria, incontinence or urinary frequency/urgency Musculoskeletal ROS: negative for - joint swelling or muscular weakness Neurological ROS: as noted in HPI Dermatological ROS: negative for rash and skin lesion changes   Blood pressure 116/67, pulse 64, temperature 98.5 F (36.9 C), temperature source Oral, resp. rate 18, height 5' 1.5" (1.562 m), weight 59.3 kg (130 lb 11.2 oz), SpO2 96 %.   Neurologic Examination:                                                                                                      HEENT-  Normocephalic, no lesions, without obvious abnormality.  Normal external eye and conjunctiva.  Normal TM's bilaterally.  Normal auditory canals and external ears. Normal external nose, mucus membranes and septum.  Normal pharynx. Cardiovascular- regular rate and rhythm, S1, S2 normal, no murmur, click, rub or gallop, pulses palpable throughout   Lungs- chest clear, no wheezing, rales, normal symmetric air entry, Heart exam - S1, S2 normal, no murmur, no gallop, rate regular Abdomen- soft, non-tender; bowel sounds normal; no masses,  no  organomegaly  Neurological Examination Mental Status: Alert, oriented, thought content appropriate.  Speech fluent without evidence of aphasia.  Able to follow 3 step commands without difficulty. Cranial Nerves: II:  Visual fields grossly normal, pupils equal, round, reactive to light and accommodation III,IV, VI: ptosis not present,  extra-ocular motions intact bilaterally V,VII: smile symmetric, facial light touch sensation normal bilaterally VIII: hearing normal bilaterally IX,X: uvula rises symmetrically XI: bilateral shoulder shrug XII: midline tongue extension Motor: Right : Upper extremity   5/5    Left:     Upper extremity   5/5  Lower extremity   5/5     Lower extremity   5/5 Tone and bulk:normal tone throughout; no atrophy noted Sensory: Pinprick and light touch intact throughout, bilaterally Cerebellar: normal finger-to-nose,      Lab Results: Basic Metabolic Panel:  Recent Labs Lab 10/21/15 1212  NA 143  K 3.7  CL 109  CO2 27  GLUCOSE 97  BUN 13  CREATININE 0.93  CALCIUM 9.3    Liver Function Tests:  Recent Labs Lab 10/21/15 1212  AST 19  ALT 11*  ALKPHOS 51  BILITOT 0.4  PROT 7.3  ALBUMIN 4.3   No results for input(s): LIPASE, AMYLASE in the last 168 hours. No results for input(s): AMMONIA in the last 168 hours.  CBC:  Recent Labs Lab 10/21/15 1212  WBC 7.9  NEUTROABS 5.6  HGB 13.7  HCT 41.0  MCV 86.0  PLT 212    Cardiac Enzymes: No results for input(s): CKTOTAL, CKMB, CKMBINDEX, TROPONINI in the last 168 hours.  Lipid Panel: No results for input(s): CHOL, TRIG, HDL, CHOLHDL, VLDL, LDLCALC in the last 168 hours.  CBG:  Recent Labs Lab 10/21/15 1214  GLUCAP 93    Microbiology: Results for orders placed or performed during the hospital encounter of 01/25/15  MRSA PCR Screening     Status: None   Collection Time: 01/26/15  2:02 AM  Result Value Ref Range Status   MRSA by PCR NEGATIVE NEGATIVE Final    Comment:         The GeneXpert MRSA Assay (FDA approved for NASAL specimens only), is one component of a comprehensive MRSA colonization surveillance program. It is not intended to diagnose MRSA infection nor to guide or monitor treatment for MRSA infections.     Coagulation Studies: No results for input(s): LABPROT, INR in the last 72 hours.  Imaging: Ct Head Wo Contrast  Result Date: 10/21/2015 CLINICAL DATA:  Weakness and dizziness for 2 days. Unable to talk for a period of time on Saturday. Stroke. EXAM: CT HEAD WITHOUT CONTRAST TECHNIQUE: Contiguous axial images were obtained from the base of the skull through the vertex without intravenous contrast. COMPARISON:  CT head without contrast 01/28/2015. FINDINGS: Brain: Previously noted anterior left frontal lobe hemorrhage has evolved to encephalomalacia. There is persistent asymmetric sulcal effacement over the high a left parietal and frontal lobes at the vertex. No other mass effect is evident. Moderate generalized atrophy and white matter disease is noted. Ventricles are proportionate to the degree of atrophy. A remote lacunar infarct is present in the left thalamus. The basal ganglia are otherwise intact. The insular ribbon is normal. Remote lacunar infarcts are present within the cerebellum. The brainstem and cerebellum are otherwise normal. Vascular: Dense atherosclerotic calcifications are present within the cavernous internal carotid arteries and at the left vertebral artery at C1. No focal hyperdense vessel is present. Skull: The calvarium is intact. Sinuses/Orbits: The visualized paranasal sinuses and mastoid air cells are clear. The globes and orbits are within normal limits. Other: IMPRESSION: 1. Expected evolution of encephalomalacia within the anterior left frontal lobe. 2. Otherwise stable atrophy and diffuse white matter disease. 3. No acute intracranial abnormality. 4. Stable chronic asymmetric prominence of the sulci in  the high right frontal  and parietal lobes. 5. Atherosclerosis Electronically Signed   By: San Morelle M.D.   On: 10/21/2015 12:39   Mr Brain Wo Contrast  Result Date: 10/22/2015 CLINICAL DATA:  Initial evaluation for difficulty speaking since Saturday. EXAM: MRI HEAD WITHOUT CONTRAST MRA HEAD WITHOUT CONTRAST TECHNIQUE: Multiplanar, multiecho pulse sequences of the brain and surrounding structures were obtained without intravenous contrast. Angiographic images of the head were obtained using MRA technique without contrast. COMPARISON:  Prior CT from 10/21/2015. FINDINGS: MRI HEAD FINDINGS Brain: Diffuse prominence of the CSF containing spaces compatible with generalized cerebral atrophy. Patchy and confluent T2/FLAIR hyperintensity within the periventricular and deep white matter both cerebral hemispheres most compatible with chronic microvascular ischemic changes, moderate in nature. Encephalomalacia with chronic hemosiderin staining and superficial siderosis present at the anterior left frontal lobe at site of previously seen intraparenchymal hematoma. Additional small remote cortical infarct with associated chronic hemosiderin staining present at the right parietal lobe (series 9, image 17). Small remote lacunar infarcts within the right basal ganglia and left thalamus. Additional scattered small remote cerebellar infarcts noted bilaterally as well, right greater than left. No abnormal foci of restricted diffusion to suggest acute or subacute ischemia. Gray-white matter differentiation maintained. No evidence for acute intracranial hemorrhage. No mass lesion, midline shift, or mass effect. Ventricular prominence related to global parenchymal volume loss without hydrocephalus. No extra-axial fluid collection. Major dural sinuses are grossly patent. Pituitary gland within normal limits. Vascular: Major intracranial vascular flow voids are maintained. Skull and upper cervical spine: Craniocervical junction normal. Mild  degenerative thickening of the tectorial membrane noted without significant stenosis. Remainder the visualized upper cervical spine unremarkable. Bone marrow signal intensity within normal limits. No scalp soft tissue abnormality. Sinuses/Orbits: The globes and orbits within normal limits. Mild scattered mucosal thickening within the ethmoidal air cells and maxillary sinuses. Paranasal sinuses are otherwise clear. Mild opacity within the inferior left mastoid air cells, of doubtful clinical significance. Right mastoid air cells clear. Inner ear structures grossly normal. Other: No other significant finding. MRA HEAD FINDINGS ANTERIOR CIRCULATION: Visualized distal cervical segments of the internal carotid arteries are widely patent with antegrade flow. Petrous, cavernous, and supraclinoid segments patent without flow-limiting stenosis. A1 segments patent. Anterior communicating artery normal. Anterior cerebral arteries well opacified to their distal aspects. M1 segments patent without stenosis or occlusion. MCA bifurcations normal. Distal right MCA branches mildly attenuated as compared to the left, similar to previous. POSTERIOR CIRCULATION: Right vertebral artery dominant and patent to the vertebrobasilar junction. Left vertebral artery hypoplastic and attenuated, stable. Posterior inferior cerebral arteries patent proximally. Basilar artery widely patent to its distal aspect. Superior cerebral arteries opacified bilaterally. Predominant fetal type right PCA supplied via the right posterior communicating artery. Left PCA arises from the basilar artery. PCAs well supplied to their distal aspects. No aneurysm or vascular malformation. Mild focal ectasia of within the para ophthalmic regions bilaterally stable. IMPRESSION: MRI HEAD IMPRESSION: 1. No acute intracranial infarct or other process identified. 2. Encephalomalacia with chronic hemosiderin staining related to prior left frontal lobe intraparenchymal  hematoma. Multiple additional remote predominantly lacunar type infarcts as detailed above. 3. Moderate chronic microvascular ischemic disease. MRA HEAD IMPRESSION: 1. Stable intracranial MRA. No large or proximal arterial branch occlusion. No high-grade or correctable stenosis. 2. Stable asymmetry of left vertebral artery, which may reflect slow flow and/or proximal stenosis. 3. Mildly decreased opacification of the distal right MCA branches as compared to the left, likely related to atherosclerosis. Electronically Signed  By: Jeannine Boga M.D.   On: 10/22/2015 02:48   Mr Jodene Nam Head/brain [id number] Cm  Result Date: 10/22/2015 CLINICAL DATA:  Initial evaluation for difficulty speaking since Saturday. EXAM: MRI HEAD WITHOUT CONTRAST MRA HEAD WITHOUT CONTRAST TECHNIQUE: Multiplanar, multiecho pulse sequences of the brain and surrounding structures were obtained without intravenous contrast. Angiographic images of the head were obtained using MRA technique without contrast. COMPARISON:  Prior CT from 10/21/2015. FINDINGS: MRI HEAD FINDINGS Brain: Diffuse prominence of the CSF containing spaces compatible with generalized cerebral atrophy. Patchy and confluent T2/FLAIR hyperintensity within the periventricular and deep white matter both cerebral hemispheres most compatible with chronic microvascular ischemic changes, moderate in nature. Encephalomalacia with chronic hemosiderin staining and superficial siderosis present at the anterior left frontal lobe at site of previously seen intraparenchymal hematoma. Additional small remote cortical infarct with associated chronic hemosiderin staining present at the right parietal lobe (series 9, image 17). Small remote lacunar infarcts within the right basal ganglia and left thalamus. Additional scattered small remote cerebellar infarcts noted bilaterally as well, right greater than left. No abnormal foci of restricted diffusion to suggest acute or subacute ischemia.  Gray-white matter differentiation maintained. No evidence for acute intracranial hemorrhage. No mass lesion, midline shift, or mass effect. Ventricular prominence related to global parenchymal volume loss without hydrocephalus. No extra-axial fluid collection. Major dural sinuses are grossly patent. Pituitary gland within normal limits. Vascular: Major intracranial vascular flow voids are maintained. Skull and upper cervical spine: Craniocervical junction normal. Mild degenerative thickening of the tectorial membrane noted without significant stenosis. Remainder the visualized upper cervical spine unremarkable. Bone marrow signal intensity within normal limits. No scalp soft tissue abnormality. Sinuses/Orbits: The globes and orbits within normal limits. Mild scattered mucosal thickening within the ethmoidal air cells and maxillary sinuses. Paranasal sinuses are otherwise clear. Mild opacity within the inferior left mastoid air cells, of doubtful clinical significance. Right mastoid air cells clear. Inner ear structures grossly normal. Other: No other significant finding. MRA HEAD FINDINGS ANTERIOR CIRCULATION: Visualized distal cervical segments of the internal carotid arteries are widely patent with antegrade flow. Petrous, cavernous, and supraclinoid segments patent without flow-limiting stenosis. A1 segments patent. Anterior communicating artery normal. Anterior cerebral arteries well opacified to their distal aspects. M1 segments patent without stenosis or occlusion. MCA bifurcations normal. Distal right MCA branches mildly attenuated as compared to the left, similar to previous. POSTERIOR CIRCULATION: Right vertebral artery dominant and patent to the vertebrobasilar junction. Left vertebral artery hypoplastic and attenuated, stable. Posterior inferior cerebral arteries patent proximally. Basilar artery widely patent to its distal aspect. Superior cerebral arteries opacified bilaterally. Predominant fetal type  right PCA supplied via the right posterior communicating artery. Left PCA arises from the basilar artery. PCAs well supplied to their distal aspects. No aneurysm or vascular malformation. Mild focal ectasia of within the para ophthalmic regions bilaterally stable. IMPRESSION: MRI HEAD IMPRESSION: 1. No acute intracranial infarct or other process identified. 2. Encephalomalacia with chronic hemosiderin staining related to prior left frontal lobe intraparenchymal hematoma. Multiple additional remote predominantly lacunar type infarcts as detailed above. 3. Moderate chronic microvascular ischemic disease. MRA HEAD IMPRESSION: 1. Stable intracranial MRA. No large or proximal arterial branch occlusion. No high-grade or correctable stenosis. 2. Stable asymmetry of left vertebral artery, which may reflect slow flow and/or proximal stenosis. 3. Mildly decreased opacification of the distal right MCA branches as compared to the left, likely related to atherosclerosis. Electronically Signed   By: Jeannine Boga M.D.   On: 10/22/2015 02:48  Assessment/Plan:   82 y.o. 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 Comes to the hospital because of a brief episode of expressive aphasia as described by herself and family. She could think of the words, but she cannot articulate them. The symptoms lasted 5 minutes and then resolved.  She rested at home on Sunday and on Monday decided to call her primary care physician's office who recommended that she come in to the emergency department. She hasn't had any further episodes.  TIA vs amensic event from prior L frontal ICH from the TBI she sustained in the setting of fatigue with minor UTI  Most likely the latter  - Would  recommend to stop ASA and only continue Plavix  -Would not recommend any further neurological studies.   -Would recommend treating minor UTI

## 2015-10-22 NOTE — Progress Notes (Signed)
Pt being discharged per orders from MD. Pt educated on discharge instructions. Pt verbalized understanding of instructions. All questions and concerns were addressed. Pt's IV was removed before discharge. Pt exited hospital via wheelchair to [facility] medical transport.

## 2015-10-22 NOTE — Progress Notes (Signed)
**  Preliminary report by tech**  Carotid artery duplex completed. Findings are consistent with a 1-39 percent stenosis involving the right internal carotid artery and the left internal carotid artery. The right vertebral artery demonstrates antegrade flow while the left vertebral artery demonstrates retrograde flow. Reversal of flow in the left vertebral artery is suggestive of subclavian or vertebral artery origin stenosis.  10/22/15 1:06 PM Allison [name] RVT

## 2015-10-22 NOTE — Progress Notes (Signed)
Pts family unable to transport her home. CM spoke to Dr Doy Mince (her son) and he was interested in having her transported home via [facility]. CM called [facility] and they are available to transport her home for $95. Information relayed to Dr Doy Mince and he was in agreement and given [employer] phone number to pay for the transport. They are to arrive between 4:30-5pm. [facility] has the number to the unit to notify the RN of their arrival. Bedside RN updated.

## 2015-10-22 NOTE — Evaluation (Signed)
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 Pt admitted 9/19 for difficulty speaking on Saturday. MRI negative for any acute infarct.  Clinical Impression  Pt presenting with generalized weakness and decreased endurance. Pt was ambulating without AD and not requires use of RW for safe ambulation at this time. Recommend 24/7 assist from someone other than spouse (he can not provide physical assist) for 2-3 days until pt is at modified indep level of function. Pt with h/o fall with severe hemorrhage last December and can not afford another fall. If 24/7 assist can not be provided SNF would be the best option for patient to achieve safe mod I level of function for safe transition home at minimal falls risk.    Follow Up Recommendations Home health PT;Supervision/Assistance - 24 hour    Equipment Recommendations  None recommended by PT    Recommendations for Other Services       Precautions / Restrictions Precautions Precautions: Fall Precaution Comments: low bp 87/60 Restrictions Weight Bearing Restrictions: No      Mobility  Bed Mobility Overal bed mobility: Needs Assistance Bed Mobility: Supine to Sit     Supine to sit: Min guard     General bed mobility comments: increased time, use of bed rail, HOB elevated  Transfers Overall transfer level: Needs assistance Equipment used: 1 person hand held assist Transfers: Sit to/from Stand Sit to Stand: Min guard;Min assist         General transfer comment: increased time, guarded, pt pushed off safely with UEs  however leaned on bed with back of legs, required RW for safe static standing at this time  Ambulation/Gait Ambulation/Gait assistance: Min guard Ambulation Distance (Feet): 100 Feet Assistive device: Rolling walker (2 wheeled) Gait Pattern/deviations: Step-through pattern;Decreased stride length;Shuffle;Narrow base of support Gait velocity: slow Gait velocity interpretation: Below normal speed for age/gender General Gait Details: pt reports "i shuffle my feet", pt slow and guarded and reports "i'm more weak in my knees than normal"  Stairs            Wheelchair Mobility    Modified Rankin (Stroke Patients Only) Modified Rankin (Stroke Patients Only) Pre-Morbid Rankin Score: Slight disability Modified Rankin: Slight disability     Balance Overall balance assessment: Needs assistance Sitting-balance support: Feet supported Sitting balance-Leahy Scale: Good     Standing balance support: Single extremity supported Standing balance-Leahy Scale: Fair Standing balance comment: observed pt standing at sink with OT, had to lean on sink                             Pertinent Vitals/Pain Pain Assessment: No/denies pain    Home Living Family/patient expects to be discharged to:: Private residence Living Arrangements: Spouse/significant other Available Help at Discharge: Family;Available 24 hours/day Type of Home: House Home Access: Ramped entrance     Home Layout: One level Home Equipment: Walker - 2 wheels;Cane - quad Additional Comments: lift chair    Prior Function Level of Independence: Independent         Comments: occasionally uses cane or walker     Hand  Dominance   Dominant Hand: Right    Extremity/Trunk Assessment   Upper Extremity Assessment: Defer to OT evaluation           Lower Extremity Assessment: Generalized weakness      Cervical / Trunk Assessment: Kyphotic  Communication   Communication: No difficulties  Cognition  Arousal/Alertness: Awake/alert Behavior During Therapy: WFL for tasks assessed/performed Overall Cognitive Status: Within Functional Limits for tasks assessed                      General Comments General comments (skin integrity, edema, etc.): Pt appears close to baseline but does have some weakness and instability on feet.  Given her PMH this last year, she cannot afford a fall.    Exercises     Assessment/Plan    PT Assessment Patient needs continued PT services  PT Problem List Decreased strength;Decreased range of motion;Decreased activity tolerance;Decreased balance;Decreased mobility          PT Treatment Interventions DME instruction;Gait training;Functional mobility training;Therapeutic activities;Therapeutic exercise;Balance training;Neuromuscular re-education    PT Goals (Current goals can be found in the Care Plan section)  Acute Rehab PT Goals Patient Stated Goal: to go home.  Pt does not want to go to SNF PT Goal Formulation: With patient Time For Goal Achievement: 10/29/15 Potential to Achieve Goals: Good    Frequency Min 4X/week   Barriers to discharge Decreased caregiver support spouse available 24/7 however unable to assist pt, pt to look into their aide to stay with them 2-3 days    Co-evaluation PT/OT/SLP Co-Evaluation/Treatment: Yes Reason for Co-Treatment: Complexity of the patient's impairments (multi-system involvement) PT goals addressed during session: Mobility/safety with mobility OT goals addressed during session: ADL's and self-care       End of Session Equipment Utilized During Treatment: Gait belt Activity Tolerance: Patient tolerated treatment well Patient left: in chair;with call bell/phone within reach;with chair alarm set Nurse Communication: Mobility status    Functional Assessment Tool Used: clinical jdugement Functional Limitation: Mobility: Walking and moving around Mobility: Walking and Moving Around Current Status  [phone]): At least 20 percent but less than 40 percent impaired, limited or restricted Mobility: Walking and Moving Around Goal Status [phone]): At least 1 percent but less than 20 percent impaired, limited or restricted    Time: [id number] PT Time Calculation (min) (ACUTE ONLY): 29 min   Charges:   PT Evaluation $PT Eval Moderate Complexity: 1 Procedure     PT G Codes:   PT G-Codes **NOT FOR INPATIENT CLASS** Functional Assessment Tool Used: clinical jdugement Functional Limitation: Mobility: Walking and moving around Mobility: Walking and Moving Around Current Status [id number]): At least 20 percent but less than 40 percent impaired, limited or restricted Mobility: Walking and Moving Around Goal Status [phone]): At least 1 percent but less than 20 percent impaired, limited or restricted    Kingsley Callander 10/22/2015, 9:55 AM   Kittie Plater, PT, DPT Pager #: [phone] Office #: [phone]

## 2015-10-22 NOTE — Discharge Summary (Addendum)
Physician Discharge Summary  Allison [name] [id number] DOB: 02-22-1926 DOA: 10/21/2015  PCP: Horatio Pel, MD  Admit date: 10/21/2015 Discharge date: 10/22/2015  Time spent:25* minutes  Recommendations for Outpatient Follow-up:  1. Patient discharged home 2. Case management discussed with patient's husband, who is his that he can provide her care at home 3. Patient with discharge with home health OT PT and social worker 4. Continue taking Plavix 75 mg daily. No aspirin   Discharge Diagnoses:  Principal Problem:   TIA (transient ischemic attack) Active Problems:   Depression   History of stroke   Discharge Condition: Stable  Diet recommendation: regular diet  Filed Weights   10/21/15 2115 10/21/15 2220  Weight: 59.9 kg (132 lb) 59.3 kg (130 lb 11.2 oz)    History of present illness:  82 y.o. 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 She did not require any neurosurgery at that time, but she was seen by neurosurgeon in consultation. Her Plavix was briefly discontinued. And now she is back on it. She was in her usual state of health till this past Saturday when she felt like she felt fatigued and then she was unable to communicate properly with her husband. She could think of the words, but she cannot articulate them. The symptoms lasted 5 minutes and then resolved. She continued to feel fatigued without any focal weakness. She rested at home on Sunday and on Monday decided to call her primary care physician's office who recommended that she come in to the emergency department. She hasn't had any further episodes of speech impairment. Denies any facial asymmetry. Denies any weakness in any one side of her body. Denies any tingling,  numbness. No fever, chills. She has noticed some frequency with urination and some burning sensation with urination  Hospital Course:   Transient aphasia/? TIA- resolved, patient was seen by neurology. No further neurologic studies ordered. As it was felt to be due to the past event of left frontal ICH from TBI she sustained in setting of fatigue. Patient will be discharged home on Plavix 75 mg by mouth daily. No aspirin recommended at this time. Will order home health PT OT and social work. MRI brain showed no acute infarct.  ? UTI- UA was normal, no antibiotics started at this time. No urine culture obtained. Patient has been afebrile with normal white count.  Procedures:  None  Consultations:  Neurology  Discharge Exam: Vitals:   10/22/15 0933 10/22/15 1100  BP: 116/67 135/67  Pulse: 64 62  Resp: 18 17  Temp: 98.5 F (36.9 C) 98.1 F (36.7 C)    General: Appears in no acute distress Cardiovascular: RRR, no murmurs rubs or gallops Respiratory: Clear to auscultation bilaterally, normal respiratory effort.  Discharge Instructions   Discharge Instructions    Diet - low sodium heart healthy    Complete by:  As directed    Increase activity slowly    Complete by:  As directed      Current Discharge Medication List    CONTINUE these medications which have NOT CHANGED   Details  amLODipine (NORVASC) 5 MG tablet Take 5 mg by mouth every morning.     Cholecalciferol (VITAMIN D PO) Take 1 tablet by mouth every morning.     clopidogrel (PLAVIX)  75 MG tablet Take 75 mg by mouth daily. Refills: 3    Coenzyme Q10 (CO Q-10) 50 MG CAPS Take 1 capsule by mouth every morning. Pt states takes 30 mgs    cycloSPORINE (RESTASIS) 0.05 % ophthalmic emulsion Place 1 drop into the left eye 2 (two) times daily.     escitalopram (LEXAPRO) 20 MG tablet Take 20 mg by mouth daily.  Refills: 1    ezetimibe (ZETIA) 10 MG tablet Take 5 mg by mouth at bedtime.     OLANZapine (ZYPREXA) 10  MG tablet Take 10 mg by mouth at bedtime.  Refills: 2    pantoprazole (PROTONIX) 40 MG tablet Take 40 mg by mouth every morning.     Polyethyl Glycol-Propyl Glycol (SYSTANE FREE OP) Place 1 drop into the right eye daily as needed (dry eyes).     rosuvastatin (CRESTOR) 10 MG tablet Take 10 mg by mouth daily.     levETIRAcetam (KEPPRA) 500 MG tablet Take 1 tablet (500 mg total) by mouth 2 (two) times daily. Qty: 60 tablet, Refills: 0       Allergies  Allergen Reactions  . Lidocaine Hcl Shortness Of Breath  . Shellfish Allergy Shortness Of Breath and Rash    All seafood  . Xylocaine [Lidocaine] Shortness Of Breath  . Fentanyl Nausea And Vomiting  . Alprazolam Other (See Comments)  . Biaxin [Clarithromycin]     unknown  . Celebrex [Celecoxib]     Unknown   . Cozaar [Losartan]     High blood pressure  . Demerol [Meperidine]     unknown  . Diazepam Other (See Comments)  . Elavil [Amitriptyline]     unknown  . Erythromycin Other (See Comments)  . Fosamax [Alendronate]     unknown  . Gabapentin Other (See Comments)  . Hydromorphone     Hives   . Inderal [Propranolol]     unknown  . Levaquin [Levofloxacin]     unknown  . Macrodantin [Nitrofurantoin]     unknown  . Meclizine Hcl Other (See Comments)  . Mycinette [Phenol]     Unknown   . Neggram [Nalidixic Acid]     unknown  . Novocain [Procaine]     unknown  . Pamelor [Nortriptyline]     unknown  . Robaxin [Methocarbamol]     unknown  . Tamiflu [Oseltamivir]     Unknown   . Tenormin [Atenolol]     unknown  . Triavil [Perphenazine-Amitriptyline]     Unknown   . Ampicillin Rash  . Iodine Rash  . Ioxaglate Rash    rash  . Ivp Dye [Iodinated Diagnostic Agents] Rash    rash  . Paxil [Paroxetine] Rash  . Penicillins Swelling and Rash    Rash Has patient had a PCN reaction causing immediate rash, facial/tongue/throat swelling, SOB or lightheadedness with hypotension: yes Has patient had a PCN reaction causing  severe rash involving mucus membranes or skin necrosis: no Has patient had a PCN reaction that required hospitalization : yes Has patient had a PCN reaction occurring within the last 10 years: no If all of the above answers are "NO", then may proceed with Cephalosporin use.   . Wellbutrin [Bupropion] Rash  . Zoloft [Sertraline] Rash      The results of significant diagnostics from this hospitalization (including imaging, microbiology, ancillary and laboratory) are listed below for reference.    Significant Diagnostic Studies: Ct Head Wo Contrast  Result Date: 10/21/2015 CLINICAL DATA:  Weakness and dizziness for 2  days. Unable to talk for a period of time on Saturday. Stroke. EXAM: CT HEAD WITHOUT CONTRAST TECHNIQUE: Contiguous axial images were obtained from the base of the skull through the vertex without intravenous contrast. COMPARISON:  CT head without contrast 01/28/2015. FINDINGS: Brain: Previously noted anterior left frontal lobe hemorrhage has evolved to encephalomalacia. There is persistent asymmetric sulcal effacement over the high a left parietal and frontal lobes at the vertex. No other mass effect is evident. Moderate generalized atrophy and white matter disease is noted. Ventricles are proportionate to the degree of atrophy. A remote lacunar infarct is present in the left thalamus. The basal ganglia are otherwise intact. The insular ribbon is normal. Remote lacunar infarcts are present within the cerebellum. The brainstem and cerebellum are otherwise normal. Vascular: Dense atherosclerotic calcifications are present within the cavernous internal carotid arteries and at the left vertebral artery at C1. No focal hyperdense vessel is present. Skull: The calvarium is intact. Sinuses/Orbits: The visualized paranasal sinuses and mastoid air cells are clear. The globes and orbits are within normal limits. Other: IMPRESSION: 1. Expected evolution of encephalomalacia within the anterior left  frontal lobe. 2. Otherwise stable atrophy and diffuse white matter disease. 3. No acute intracranial abnormality. 4. Stable chronic asymmetric prominence of the sulci in the high right frontal and parietal lobes. 5. Atherosclerosis Electronically Signed   By: San Morelle M.D.   On: 10/21/2015 12:39   Mr Brain Wo Contrast  Result Date: 10/22/2015 CLINICAL DATA:  Initial evaluation for difficulty speaking since Saturday. EXAM: MRI HEAD WITHOUT CONTRAST MRA HEAD WITHOUT CONTRAST TECHNIQUE: Multiplanar, multiecho pulse sequences of the brain and surrounding structures were obtained without intravenous contrast. Angiographic images of the head were obtained using MRA technique without contrast. COMPARISON:  Prior CT from 10/21/2015. FINDINGS: MRI HEAD FINDINGS Brain: Diffuse prominence of the CSF containing spaces compatible with generalized cerebral atrophy. Patchy and confluent T2/FLAIR hyperintensity within the periventricular and deep white matter both cerebral hemispheres most compatible with chronic microvascular ischemic changes, moderate in nature. Encephalomalacia with chronic hemosiderin staining and superficial siderosis present at the anterior left frontal lobe at site of previously seen intraparenchymal hematoma. Additional small remote cortical infarct with associated chronic hemosiderin staining present at the right parietal lobe (series 9, image 17). Small remote lacunar infarcts within the right basal ganglia and left thalamus. Additional scattered small remote cerebellar infarcts noted bilaterally as well, right greater than left. No abnormal foci of restricted diffusion to suggest acute or subacute ischemia. Gray-white matter differentiation maintained. No evidence for acute intracranial hemorrhage. No mass lesion, midline shift, or mass effect. Ventricular prominence related to global parenchymal volume loss without hydrocephalus. No extra-axial fluid collection. Major dural sinuses are  grossly patent. Pituitary gland within normal limits. Vascular: Major intracranial vascular flow voids are maintained. Skull and upper cervical spine: Craniocervical junction normal. Mild degenerative thickening of the tectorial membrane noted without significant stenosis. Remainder the visualized upper cervical spine unremarkable. Bone marrow signal intensity within normal limits. No scalp soft tissue abnormality. Sinuses/Orbits: The globes and orbits within normal limits. Mild scattered mucosal thickening within the ethmoidal air cells and maxillary sinuses. Paranasal sinuses are otherwise clear. Mild opacity within the inferior left mastoid air cells, of doubtful clinical significance. Right mastoid air cells clear. Inner ear structures grossly normal. Other: No other significant finding. MRA HEAD FINDINGS ANTERIOR CIRCULATION: Visualized distal cervical segments of the internal carotid arteries are widely patent with antegrade flow. Petrous, cavernous, and supraclinoid segments patent without flow-limiting stenosis. A1  segments patent. Anterior communicating artery normal. Anterior cerebral arteries well opacified to their distal aspects. M1 segments patent without stenosis or occlusion. MCA bifurcations normal. Distal right MCA branches mildly attenuated as compared to the left, similar to previous. POSTERIOR CIRCULATION: Right vertebral artery dominant and patent to the vertebrobasilar junction. Left vertebral artery hypoplastic and attenuated, stable. Posterior inferior cerebral arteries patent proximally. Basilar artery widely patent to its distal aspect. Superior cerebral arteries opacified bilaterally. Predominant fetal type right PCA supplied via the right posterior communicating artery. Left PCA arises from the basilar artery. PCAs well supplied to their distal aspects. No aneurysm or vascular malformation. Mild focal ectasia of within the para ophthalmic regions bilaterally stable. IMPRESSION: MRI HEAD  IMPRESSION: 1. No acute intracranial infarct or other process identified. 2. Encephalomalacia with chronic hemosiderin staining related to prior left frontal lobe intraparenchymal hematoma. Multiple additional remote predominantly lacunar type infarcts as detailed above. 3. Moderate chronic microvascular ischemic disease. MRA HEAD IMPRESSION: 1. Stable intracranial MRA. No large or proximal arterial branch occlusion. No high-grade or correctable stenosis. 2. Stable asymmetry of left vertebral artery, which may reflect slow flow and/or proximal stenosis. 3. Mildly decreased opacification of the distal right MCA branches as compared to the left, likely related to atherosclerosis. Electronically Signed   By: Jeannine Boga M.D.   On: 10/22/2015 02:48   Mr Jodene Nam Head/brain [id number] Cm  Result Date: 10/22/2015 CLINICAL DATA:  Initial evaluation for difficulty speaking since Saturday. EXAM: MRI HEAD WITHOUT CONTRAST MRA HEAD WITHOUT CONTRAST TECHNIQUE: Multiplanar, multiecho pulse sequences of the brain and surrounding structures were obtained without intravenous contrast. Angiographic images of the head were obtained using MRA technique without contrast. COMPARISON:  Prior CT from 10/21/2015. FINDINGS: MRI HEAD FINDINGS Brain: Diffuse prominence of the CSF containing spaces compatible with generalized cerebral atrophy. Patchy and confluent T2/FLAIR hyperintensity within the periventricular and deep white matter both cerebral hemispheres most compatible with chronic microvascular ischemic changes, moderate in nature. Encephalomalacia with chronic hemosiderin staining and superficial siderosis present at the anterior left frontal lobe at site of previously seen intraparenchymal hematoma. Additional small remote cortical infarct with associated chronic hemosiderin staining present at the right parietal lobe (series 9, image 17). Small remote lacunar infarcts within the right basal ganglia and left thalamus. Additional  scattered small remote cerebellar infarcts noted bilaterally as well, right greater than left. No abnormal foci of restricted diffusion to suggest acute or subacute ischemia. Gray-white matter differentiation maintained. No evidence for acute intracranial hemorrhage. No mass lesion, midline shift, or mass effect. Ventricular prominence related to global parenchymal volume loss without hydrocephalus. No extra-axial fluid collection. Major dural sinuses are grossly patent. Pituitary gland within normal limits. Vascular: Major intracranial vascular flow voids are maintained. Skull and upper cervical spine: Craniocervical junction normal. Mild degenerative thickening of the tectorial membrane noted without significant stenosis. Remainder the visualized upper cervical spine unremarkable. Bone marrow signal intensity within normal limits. No scalp soft tissue abnormality. Sinuses/Orbits: The globes and orbits within normal limits. Mild scattered mucosal thickening within the ethmoidal air cells and maxillary sinuses. Paranasal sinuses are otherwise clear. Mild opacity within the inferior left mastoid air cells, of doubtful clinical significance. Right mastoid air cells clear. Inner ear structures grossly normal. Other: No other significant finding. MRA HEAD FINDINGS ANTERIOR CIRCULATION: Visualized distal cervical segments of the internal carotid arteries are widely patent with antegrade flow. Petrous, cavernous, and supraclinoid segments patent without flow-limiting stenosis. A1 segments patent. Anterior communicating artery normal. Anterior cerebral arteries well opacified  to their distal aspects. M1 segments patent without stenosis or occlusion. MCA bifurcations normal. Distal right MCA branches mildly attenuated as compared to the left, similar to previous. POSTERIOR CIRCULATION: Right vertebral artery dominant and patent to the vertebrobasilar junction. Left vertebral artery hypoplastic and attenuated, stable.  Posterior inferior cerebral arteries patent proximally. Basilar artery widely patent to its distal aspect. Superior cerebral arteries opacified bilaterally. Predominant fetal type right PCA supplied via the right posterior communicating artery. Left PCA arises from the basilar artery. PCAs well supplied to their distal aspects. No aneurysm or vascular malformation. Mild focal ectasia of within the para ophthalmic regions bilaterally stable. IMPRESSION: MRI HEAD IMPRESSION: 1. No acute intracranial infarct or other process identified. 2. Encephalomalacia with chronic hemosiderin staining related to prior left frontal lobe intraparenchymal hematoma. Multiple additional remote predominantly lacunar type infarcts as detailed above. 3. Moderate chronic microvascular ischemic disease. MRA HEAD IMPRESSION: 1. Stable intracranial MRA. No large or proximal arterial branch occlusion. No high-grade or correctable stenosis. 2. Stable asymmetry of left vertebral artery, which may reflect slow flow and/or proximal stenosis. 3. Mildly decreased opacification of the distal right MCA branches as compared to the left, likely related to atherosclerosis. Electronically Signed   By: Jeannine Boga M.D.   On: 10/22/2015 02:48    Microbiology: No results found for this or any previous visit (from the past 240 hour(s)).   Labs: Basic Metabolic Panel:  Recent Labs Lab 10/21/15 1212  NA 143  K 3.7  CL 109  CO2 27  GLUCOSE 97  BUN 13  CREATININE 0.93  CALCIUM 9.3   Liver Function Tests:  Recent Labs Lab 10/21/15 1212  AST 19  ALT 11*  ALKPHOS 51  BILITOT 0.4  PROT 7.3  ALBUMIN 4.3   No results for input(s): LIPASE, AMYLASE in the last 168 hours. No results for input(s): AMMONIA in the last 168 hours. CBC:  Recent Labs Lab 10/21/15 1212  WBC 7.9  NEUTROABS 5.6  HGB 13.7  HCT 41.0  MCV 86.0  PLT 212   Cardiac Enzymes: No results for input(s): CKTOTAL, CKMB, CKMBINDEX, TROPONINI in the last  168 hours. BNP: BNP (last 3 results) No results for input(s): BNP in the last 8760 hours.  ProBNP (last 3 results) No results for input(s): PROBNP in the last 8760 hours.  CBG:  Recent Labs Lab 10/21/15 1214  GLUCAP 93       Signed:  Lakyla Biswas S MD.  [facility] 10/22/2015, 11:37 AM

## 2015-10-22 NOTE — Evaluation (Signed)
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 Pt admitted 9/19 for difficulty speaking on Saturday. MRI negative for any acute infarct.   Clinical Impression   Pt admitted with the above diagnosis and overall is doing well with adls but has some deficits listed below. Feel pt would benefit from cont OT to increase independence and safety with basic adls and transfers so she can return home safely with her husband. Husband is independent but cannot help pt much so feel she would benefit from someone staying with them for first 3 days or so to transition pt back home and ensure her safety. Pt feels this should be possible. If it is not, pt may need to go to SNF for a few days.  Pt has had a busy year of being in hospital with surgeries in Dec and cannot afford a fall, so if this supervision is not available, SNF would be the best option.    Follow Up Recommendations  Home health OT;Supervision/Assistance - 24 hour;Other (comment) (24 hour S for first few days.)    Equipment Recommendations  Tub/shower seat    Recommendations for Other Services       Precautions / Restrictions Precautions Precautions: Fall Precaution Comments: low bp 87/60 Restrictions Weight Bearing Restrictions: No      Mobility Bed Mobility Overal bed mobility: Needs Assistance Bed Mobility: Supine to Sit     Supine to sit: Min guard     General bed mobility comments: increased time, use of bed rail, HOB elevated  Transfers Overall transfer level: Needs assistance Equipment used: 1 person  hand held assist Transfers: Sit to/from Stand Sit to Stand: Min guard;Min assist         General transfer comment: increased time, guarded, pt pushed off safely with UEs however leaned on bed with back of legs, required RW for safe static standing at this time    Balance Overall balance assessment: Needs assistance Sitting-balance support: Feet supported Sitting balance-Leahy Scale: Good     Standing balance support: Bilateral upper extremity supported;During functional activity Standing balance-Leahy Scale: Fair Standing balance comment: Pt able to stand at sink and let go of walker without propping on sink but does need walker to be mobile.                            ADL Overall ADL's : Needs assistance/impaired Eating/Feeding: Set up;Sitting Eating/Feeding Details (indicate cue type and reason): some trouble cutting food with set up due to breaking wrist last december and having surgery Grooming: Oral care;Min guard;Standing;Wash/dry hands Grooming Details (indicate cue type and reason): min guard at sink.  No LOB but BP was low to begin with so was precautionary. Upper Body Bathing: Set up;Sitting   Lower Body Bathing: Min guard;Sit to/from stand Lower Body Bathing Details (indicate cue type and reason): min guard to stand. Pt feels weak in her kneees. Upper Body Dressing : Set up;Sitting   Lower Body Dressing: Min guard;Sit to/from stand Lower Body Dressing Details (indicate cue type and reason): min guard when on her feet. Toilet Transfer: Minimal assistance;Ambulation;Comfort  height toilet;RW Toilet Transfer Details (indicate cue type and reason): min assist to get fully into standing on first attempt up. Toileting- Water quality scientist and Hygiene: Min guard;Sit to/from stand Toileting - Clothing Manipulation Details (indicate cue type and reason): min guard for safety     Functional mobility during ADLs: Minimal assistance;Rolling walker General ADL  Comments: Pt with a shuffling gait but states this is how she has walked since her accident in Dec.  Pt can reach her feet and do most adls but is just weak and debilitated.  Will need some S with occasional assist at home.     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 Pt no longer drives because of this and has some difficulty with over shooting but feels her vision has not changed. Pt functional with adls.   Perception Perception Perception Tested?: Yes   Praxis      Pertinent Vitals/Pain Pain Assessment: No/denies pain     Hand Dominance Right   Extremity/Trunk Assessment Upper Extremity Assessment Upper Extremity Assessment: Generalized weakness   Lower Extremity Assessment Lower Extremity Assessment: Defer to PT evaluation   Cervical / Trunk Assessment Cervical / Trunk Assessment: Kyphotic   Communication Communication Communication: No difficulties   Cognition Arousal/Alertness: Awake/alert Behavior During Therapy: WFL for tasks assessed/performed Overall Cognitive Status: Within Functional Limits for tasks assessed                     General Comments       Exercises       Shoulder Instructions      Home Living Family/patient expects to be discharged to:: Private residence Living Arrangements: Spouse/significant other Available Help at Discharge: Family;Available 24 hours/day Type of Home: House Home Access: Ramped entrance     Home Layout: One level     Bathroom Shower/Tub: Occupational psychologist: Handicapped height Bathroom Accessibility: Yes How Accessible: Accessible via walker Home Equipment: Golden - 2 wheels;Cane - quad   Additional Comments: lift chair      Prior Functioning/Environment Level of Independence: Independent        Comments: occasionally uses cane or walker        OT Problem List: Decreased strength;Impaired balance (sitting and/or standing);Decreased knowledge of use of DME or  AE   OT Treatment/Interventions: Self-care/ADL training;Therapeutic activities;DME and/or AE instruction;Balance training    OT Goals(Current goals can be found in the care plan section) Acute Rehab OT Goals Patient Stated Goal: to go home.  Pt does not want to go to SNF OT Goal Formulation: With patient Time For Goal Achievement: 10/29/15 Potential to Achieve Goals: Good ADL Goals Pt Will Perform Grooming: with modified independence;standing Pt Will Perform Lower Body Dressing: with modified independence;sit to/from stand Pt Will Perform Tub/Shower Transfer: with supervision;ambulating;shower seat;Shower transfer Additional ADL Goal #1: Pt will walk to bathroom with walker and do all toileting tasks with S.  OT Frequency: Min 2X/week   Barriers to D/C: Decreased caregiver support  husband available but can't help much. Feel pt needs someone at home with her for the first few days after d/c.         Co-evaluation PT/OT/SLP Co-Evaluation/Treatment: Yes Reason for Co-Treatment: For patient/therapist safety PT goals addressed during session: Mobility/safety with mobility OT goals addressed during session: ADL's and self-care      End of Session Equipment Utilized During Treatment: Rolling walker;Gait belt Nurse Communication: Mobility status  Activity Tolerance: Patient tolerated treatment well Patient left:  in chair;with call bell/phone within reach;with chair alarm set   Time: [phone] OT Time Calculation (min): 37 min Charges:  OT General Charges $OT Visit: 1 Procedure OT Evaluation $OT Eval Moderate Complexity: 1 Procedure G-Codes: OT G-codes **NOT FOR INPATIENT CLASS** Functional Assessment Tool Used: clinical judgement Functional Limitation: Self care Self Care Current Status [id number]): At least 1 percent but less than 20 percent impaired, limited or restricted Self Care Goal Status [id number]): At least 1 percent but less than 20 percent impaired, limited or restricted   Glenford Peers 10/22/2015, 9:36 AM  [phone]

## 2015-10-22 NOTE — Care Management Obs Status (Signed)
[facility] NOTIFICATION   Patient Details  Name: SHAHED GREENHUT MRN: [id number] Date of Birth: [birth year]   Medicare Observation Status Notification Given:  Yes    Pollie Friar, RN 10/22/2015, 4:05 PM

## 2015-10-22 NOTE — Care Management Note (Signed)
Case Management Note  Patient Details  Name: Allison [name] MRN: [id number] Date of Birth: 1926-09-18  Subjective/Objective:                    Action/Plan: Patient discharging home today with orders for [facility] services. CM met with patient and provided her a list of [place] agencies in the [place] area. She states she has used [facility] in the past and would like to use them again. Butch Penny with [facility] notified and accepted the referral. CM spoke to patients husband and notified him of his wifes discharge and that [facility] will be provided. CM informed him that [name] will need increased assistance at home after discharge. He states he will notify some of his family and that it should not be a problem to get additional assistance at home. Mr Pothier is to call CM and inform them if she will need transportation or if someone in the family will be picking her up. CM also spoke to Dr Doy Mince, the patients son, over the phone. He lives in [place] and is unable to provide much physical assistance at the home. He is concerned about his mother needing increased assistance. He states his step father is not able to help his mother much d/t his immobility and use of a walker. Dr Doy Mince states he already has provided a caretaker in the home 2 days of the week for 4 hours at a time. He was interested in having a list of private duty care takers to increase the amount of care in the home. CM was able to email him a copy of the private duty care givers list. He also was informed that there were orders and arrangements for [facility] services. He was in agreement with this and felt that having the medical personnel in the home several times during the week would be sufficient for now until he is able to find more caregivers.   Expected Discharge Date:   (unknown)               Expected Discharge Plan:  [place]  In-House Referral:     Discharge planning Services  CM Consult  Post Acute Care Choice:     Choice offered to:  Patient, Spouse, Adult Children  DME Arranged:    DME Agency:     HH Arranged:  RN, PT, OT, Nurse's Aide, Social Work [employer] Agency:  [facility]  Status of Service:  Completed, signed off  If discussed at [employer], dates discussed:    Additional Comments:  Pollie Friar, RN 10/22/2015, 1:03 PM

## 2015-10-23 DIAGNOSIS — N189 Chronic kidney disease, unspecified: Secondary | ICD-10-CM | POA: Diagnosis not present

## 2015-10-23 DIAGNOSIS — K219 Gastro-esophageal reflux disease without esophagitis: Secondary | ICD-10-CM | POA: Diagnosis not present

## 2015-10-23 DIAGNOSIS — H5412 Blindness, left eye, low vision right eye: Secondary | ICD-10-CM | POA: Diagnosis not present

## 2015-10-23 DIAGNOSIS — I129 Hypertensive chronic kidney disease with stage 1 through stage 4 chronic kidney disease, or unspecified chronic kidney disease: Secondary | ICD-10-CM | POA: Diagnosis not present

## 2015-10-23 DIAGNOSIS — Z8673 Personal history of transient ischemic attack (TIA), and cerebral infarction without residual deficits: Secondary | ICD-10-CM | POA: Diagnosis not present

## 2015-10-23 DIAGNOSIS — I69398 Other sequelae of cerebral infarction: Secondary | ICD-10-CM | POA: Diagnosis not present

## 2015-10-23 DIAGNOSIS — M810 Age-related osteoporosis without current pathological fracture: Secondary | ICD-10-CM | POA: Diagnosis not present

## 2015-10-23 DIAGNOSIS — F341 Dysthymic disorder: Secondary | ICD-10-CM | POA: Diagnosis not present

## 2015-10-24 DIAGNOSIS — Z8673 Personal history of transient ischemic attack (TIA), and cerebral infarction without residual deficits: Secondary | ICD-10-CM | POA: Diagnosis not present

## 2015-10-24 DIAGNOSIS — I129 Hypertensive chronic kidney disease with stage 1 through stage 4 chronic kidney disease, or unspecified chronic kidney disease: Secondary | ICD-10-CM | POA: Diagnosis not present

## 2015-10-24 DIAGNOSIS — H5412 Blindness, left eye, low vision right eye: Secondary | ICD-10-CM | POA: Diagnosis not present

## 2015-10-24 DIAGNOSIS — I69398 Other sequelae of cerebral infarction: Secondary | ICD-10-CM | POA: Diagnosis not present

## 2015-10-24 DIAGNOSIS — N189 Chronic kidney disease, unspecified: Secondary | ICD-10-CM | POA: Diagnosis not present

## 2015-10-24 DIAGNOSIS — M810 Age-related osteoporosis without current pathological fracture: Secondary | ICD-10-CM | POA: Diagnosis not present

## 2015-10-28 DIAGNOSIS — G459 Transient cerebral ischemic attack, unspecified: Secondary | ICD-10-CM | POA: Diagnosis not present

## 2015-10-28 DIAGNOSIS — Z23 Encounter for immunization: Secondary | ICD-10-CM | POA: Diagnosis not present

## 2015-10-29 DIAGNOSIS — N189 Chronic kidney disease, unspecified: Secondary | ICD-10-CM | POA: Diagnosis not present

## 2015-10-29 DIAGNOSIS — I69398 Other sequelae of cerebral infarction: Secondary | ICD-10-CM | POA: Diagnosis not present

## 2015-10-29 DIAGNOSIS — I129 Hypertensive chronic kidney disease with stage 1 through stage 4 chronic kidney disease, or unspecified chronic kidney disease: Secondary | ICD-10-CM | POA: Diagnosis not present

## 2015-10-29 DIAGNOSIS — H5412 Blindness, left eye, low vision right eye: Secondary | ICD-10-CM | POA: Diagnosis not present

## 2015-10-29 DIAGNOSIS — Z8673 Personal history of transient ischemic attack (TIA), and cerebral infarction without residual deficits: Secondary | ICD-10-CM | POA: Diagnosis not present

## 2015-10-29 DIAGNOSIS — M810 Age-related osteoporosis without current pathological fracture: Secondary | ICD-10-CM | POA: Diagnosis not present

## 2015-10-30 DIAGNOSIS — I69398 Other sequelae of cerebral infarction: Secondary | ICD-10-CM | POA: Diagnosis not present

## 2015-10-30 DIAGNOSIS — I129 Hypertensive chronic kidney disease with stage 1 through stage 4 chronic kidney disease, or unspecified chronic kidney disease: Secondary | ICD-10-CM | POA: Diagnosis not present

## 2015-10-30 DIAGNOSIS — N189 Chronic kidney disease, unspecified: Secondary | ICD-10-CM | POA: Diagnosis not present

## 2015-10-30 DIAGNOSIS — H5412 Blindness, left eye, low vision right eye: Secondary | ICD-10-CM | POA: Diagnosis not present

## 2015-10-30 DIAGNOSIS — Z8673 Personal history of transient ischemic attack (TIA), and cerebral infarction without residual deficits: Secondary | ICD-10-CM | POA: Diagnosis not present

## 2015-10-30 DIAGNOSIS — M810 Age-related osteoporosis without current pathological fracture: Secondary | ICD-10-CM | POA: Diagnosis not present

## 2015-10-31 DIAGNOSIS — Z8673 Personal history of transient ischemic attack (TIA), and cerebral infarction without residual deficits: Secondary | ICD-10-CM | POA: Diagnosis not present

## 2015-10-31 DIAGNOSIS — I129 Hypertensive chronic kidney disease with stage 1 through stage 4 chronic kidney disease, or unspecified chronic kidney disease: Secondary | ICD-10-CM | POA: Diagnosis not present

## 2015-10-31 DIAGNOSIS — H5412 Blindness, left eye, low vision right eye: Secondary | ICD-10-CM | POA: Diagnosis not present

## 2015-10-31 DIAGNOSIS — I69398 Other sequelae of cerebral infarction: Secondary | ICD-10-CM | POA: Diagnosis not present

## 2015-10-31 DIAGNOSIS — N189 Chronic kidney disease, unspecified: Secondary | ICD-10-CM | POA: Diagnosis not present

## 2015-10-31 DIAGNOSIS — M810 Age-related osteoporosis without current pathological fracture: Secondary | ICD-10-CM | POA: Diagnosis not present

## 2015-11-05 DIAGNOSIS — I129 Hypertensive chronic kidney disease with stage 1 through stage 4 chronic kidney disease, or unspecified chronic kidney disease: Secondary | ICD-10-CM | POA: Diagnosis not present

## 2015-11-05 DIAGNOSIS — N189 Chronic kidney disease, unspecified: Secondary | ICD-10-CM | POA: Diagnosis not present

## 2015-11-05 DIAGNOSIS — I69398 Other sequelae of cerebral infarction: Secondary | ICD-10-CM | POA: Diagnosis not present

## 2015-11-05 DIAGNOSIS — Z8673 Personal history of transient ischemic attack (TIA), and cerebral infarction without residual deficits: Secondary | ICD-10-CM | POA: Diagnosis not present

## 2015-11-05 DIAGNOSIS — H5412 Blindness, left eye, low vision right eye: Secondary | ICD-10-CM | POA: Diagnosis not present

## 2015-11-05 DIAGNOSIS — M810 Age-related osteoporosis without current pathological fracture: Secondary | ICD-10-CM | POA: Diagnosis not present

## 2015-11-07 DIAGNOSIS — I129 Hypertensive chronic kidney disease with stage 1 through stage 4 chronic kidney disease, or unspecified chronic kidney disease: Secondary | ICD-10-CM | POA: Diagnosis not present

## 2015-11-07 DIAGNOSIS — Z8673 Personal history of transient ischemic attack (TIA), and cerebral infarction without residual deficits: Secondary | ICD-10-CM | POA: Diagnosis not present

## 2015-11-07 DIAGNOSIS — I69398 Other sequelae of cerebral infarction: Secondary | ICD-10-CM | POA: Diagnosis not present

## 2015-11-07 DIAGNOSIS — N189 Chronic kidney disease, unspecified: Secondary | ICD-10-CM | POA: Diagnosis not present

## 2015-11-07 DIAGNOSIS — M810 Age-related osteoporosis without current pathological fracture: Secondary | ICD-10-CM | POA: Diagnosis not present

## 2015-11-07 DIAGNOSIS — H5412 Blindness, left eye, low vision right eye: Secondary | ICD-10-CM | POA: Diagnosis not present

## 2015-11-12 DIAGNOSIS — I69398 Other sequelae of cerebral infarction: Secondary | ICD-10-CM | POA: Diagnosis not present

## 2015-11-12 DIAGNOSIS — M810 Age-related osteoporosis without current pathological fracture: Secondary | ICD-10-CM | POA: Diagnosis not present

## 2015-11-12 DIAGNOSIS — Z8673 Personal history of transient ischemic attack (TIA), and cerebral infarction without residual deficits: Secondary | ICD-10-CM | POA: Diagnosis not present

## 2015-11-12 DIAGNOSIS — I129 Hypertensive chronic kidney disease with stage 1 through stage 4 chronic kidney disease, or unspecified chronic kidney disease: Secondary | ICD-10-CM | POA: Diagnosis not present

## 2015-11-12 DIAGNOSIS — H5412 Blindness, left eye, low vision right eye: Secondary | ICD-10-CM | POA: Diagnosis not present

## 2015-11-12 DIAGNOSIS — N189 Chronic kidney disease, unspecified: Secondary | ICD-10-CM | POA: Diagnosis not present

## 2015-11-14 DIAGNOSIS — R5383 Other fatigue: Secondary | ICD-10-CM | POA: Diagnosis not present

## 2015-11-14 DIAGNOSIS — R269 Unspecified abnormalities of gait and mobility: Secondary | ICD-10-CM | POA: Diagnosis not present

## 2015-11-15 DIAGNOSIS — N189 Chronic kidney disease, unspecified: Secondary | ICD-10-CM | POA: Diagnosis not present

## 2015-11-15 DIAGNOSIS — M810 Age-related osteoporosis without current pathological fracture: Secondary | ICD-10-CM | POA: Diagnosis not present

## 2015-11-15 DIAGNOSIS — I69398 Other sequelae of cerebral infarction: Secondary | ICD-10-CM | POA: Diagnosis not present

## 2015-11-15 DIAGNOSIS — I129 Hypertensive chronic kidney disease with stage 1 through stage 4 chronic kidney disease, or unspecified chronic kidney disease: Secondary | ICD-10-CM | POA: Diagnosis not present

## 2015-11-15 DIAGNOSIS — Z8673 Personal history of transient ischemic attack (TIA), and cerebral infarction without residual deficits: Secondary | ICD-10-CM | POA: Diagnosis not present

## 2015-11-15 DIAGNOSIS — H5412 Blindness, left eye, low vision right eye: Secondary | ICD-10-CM | POA: Diagnosis not present

## 2015-11-20 DIAGNOSIS — N189 Chronic kidney disease, unspecified: Secondary | ICD-10-CM | POA: Diagnosis not present

## 2015-11-20 DIAGNOSIS — I69398 Other sequelae of cerebral infarction: Secondary | ICD-10-CM | POA: Diagnosis not present

## 2015-11-20 DIAGNOSIS — I129 Hypertensive chronic kidney disease with stage 1 through stage 4 chronic kidney disease, or unspecified chronic kidney disease: Secondary | ICD-10-CM | POA: Diagnosis not present

## 2015-11-20 DIAGNOSIS — Z8673 Personal history of transient ischemic attack (TIA), and cerebral infarction without residual deficits: Secondary | ICD-10-CM | POA: Diagnosis not present

## 2015-11-20 DIAGNOSIS — H5412 Blindness, left eye, low vision right eye: Secondary | ICD-10-CM | POA: Diagnosis not present

## 2015-11-20 DIAGNOSIS — M810 Age-related osteoporosis without current pathological fracture: Secondary | ICD-10-CM | POA: Diagnosis not present

## 2015-11-22 DIAGNOSIS — H5412 Blindness, left eye, low vision right eye: Secondary | ICD-10-CM | POA: Diagnosis not present

## 2015-11-22 DIAGNOSIS — N189 Chronic kidney disease, unspecified: Secondary | ICD-10-CM | POA: Diagnosis not present

## 2015-11-22 DIAGNOSIS — I129 Hypertensive chronic kidney disease with stage 1 through stage 4 chronic kidney disease, or unspecified chronic kidney disease: Secondary | ICD-10-CM | POA: Diagnosis not present

## 2015-11-22 DIAGNOSIS — Z8673 Personal history of transient ischemic attack (TIA), and cerebral infarction without residual deficits: Secondary | ICD-10-CM | POA: Diagnosis not present

## 2015-11-22 DIAGNOSIS — I69398 Other sequelae of cerebral infarction: Secondary | ICD-10-CM | POA: Diagnosis not present

## 2015-11-22 DIAGNOSIS — M810 Age-related osteoporosis without current pathological fracture: Secondary | ICD-10-CM | POA: Diagnosis not present

## 2015-11-25 DIAGNOSIS — N189 Chronic kidney disease, unspecified: Secondary | ICD-10-CM | POA: Diagnosis not present

## 2015-11-25 DIAGNOSIS — Z8673 Personal history of transient ischemic attack (TIA), and cerebral infarction without residual deficits: Secondary | ICD-10-CM | POA: Diagnosis not present

## 2015-11-25 DIAGNOSIS — I129 Hypertensive chronic kidney disease with stage 1 through stage 4 chronic kidney disease, or unspecified chronic kidney disease: Secondary | ICD-10-CM | POA: Diagnosis not present

## 2015-11-25 DIAGNOSIS — H5412 Blindness, left eye, low vision right eye: Secondary | ICD-10-CM | POA: Diagnosis not present

## 2015-11-25 DIAGNOSIS — I69398 Other sequelae of cerebral infarction: Secondary | ICD-10-CM | POA: Diagnosis not present

## 2015-11-25 DIAGNOSIS — M810 Age-related osteoporosis without current pathological fracture: Secondary | ICD-10-CM | POA: Diagnosis not present

## 2015-11-26 DIAGNOSIS — I69398 Other sequelae of cerebral infarction: Secondary | ICD-10-CM | POA: Diagnosis not present

## 2015-11-26 DIAGNOSIS — N189 Chronic kidney disease, unspecified: Secondary | ICD-10-CM | POA: Diagnosis not present

## 2015-11-26 DIAGNOSIS — M810 Age-related osteoporosis without current pathological fracture: Secondary | ICD-10-CM | POA: Diagnosis not present

## 2015-11-26 DIAGNOSIS — I129 Hypertensive chronic kidney disease with stage 1 through stage 4 chronic kidney disease, or unspecified chronic kidney disease: Secondary | ICD-10-CM | POA: Diagnosis not present

## 2015-11-26 DIAGNOSIS — Z8673 Personal history of transient ischemic attack (TIA), and cerebral infarction without residual deficits: Secondary | ICD-10-CM | POA: Diagnosis not present

## 2015-11-26 DIAGNOSIS — H5412 Blindness, left eye, low vision right eye: Secondary | ICD-10-CM | POA: Diagnosis not present

## 2015-11-27 DIAGNOSIS — M810 Age-related osteoporosis without current pathological fracture: Secondary | ICD-10-CM | POA: Diagnosis not present

## 2015-11-27 DIAGNOSIS — I69398 Other sequelae of cerebral infarction: Secondary | ICD-10-CM | POA: Diagnosis not present

## 2015-11-27 DIAGNOSIS — H5412 Blindness, left eye, low vision right eye: Secondary | ICD-10-CM | POA: Diagnosis not present

## 2015-11-27 DIAGNOSIS — I129 Hypertensive chronic kidney disease with stage 1 through stage 4 chronic kidney disease, or unspecified chronic kidney disease: Secondary | ICD-10-CM | POA: Diagnosis not present

## 2015-11-27 DIAGNOSIS — Z8673 Personal history of transient ischemic attack (TIA), and cerebral infarction without residual deficits: Secondary | ICD-10-CM | POA: Diagnosis not present

## 2015-11-27 DIAGNOSIS — N189 Chronic kidney disease, unspecified: Secondary | ICD-10-CM | POA: Diagnosis not present

## 2015-12-10 DIAGNOSIS — Z8673 Personal history of transient ischemic attack (TIA), and cerebral infarction without residual deficits: Secondary | ICD-10-CM | POA: Diagnosis not present

## 2015-12-10 DIAGNOSIS — H547 Unspecified visual loss: Secondary | ICD-10-CM | POA: Diagnosis not present

## 2015-12-10 DIAGNOSIS — I69398 Other sequelae of cerebral infarction: Secondary | ICD-10-CM | POA: Diagnosis not present

## 2015-12-10 DIAGNOSIS — I129 Hypertensive chronic kidney disease with stage 1 through stage 4 chronic kidney disease, or unspecified chronic kidney disease: Secondary | ICD-10-CM | POA: Diagnosis not present

## 2016-02-04 DIAGNOSIS — L304 Erythema intertrigo: Secondary | ICD-10-CM | POA: Diagnosis not present

## 2016-02-04 DIAGNOSIS — R8290 Unspecified abnormal findings in urine: Secondary | ICD-10-CM | POA: Diagnosis not present

## 2016-02-04 DIAGNOSIS — R5383 Other fatigue: Secondary | ICD-10-CM | POA: Diagnosis not present

## 2016-02-04 DIAGNOSIS — R269 Unspecified abnormalities of gait and mobility: Secondary | ICD-10-CM | POA: Diagnosis not present

## 2016-02-04 DIAGNOSIS — N390 Urinary tract infection, site not specified: Secondary | ICD-10-CM | POA: Diagnosis not present

## 2016-02-10 DIAGNOSIS — J439 Emphysema, unspecified: Secondary | ICD-10-CM | POA: Diagnosis not present

## 2016-02-10 DIAGNOSIS — F329 Major depressive disorder, single episode, unspecified: Secondary | ICD-10-CM | POA: Diagnosis not present

## 2016-02-10 DIAGNOSIS — I10 Essential (primary) hypertension: Secondary | ICD-10-CM | POA: Diagnosis not present

## 2016-02-10 DIAGNOSIS — R2689 Other abnormalities of gait and mobility: Secondary | ICD-10-CM | POA: Diagnosis not present

## 2016-02-10 DIAGNOSIS — E1142 Type 2 diabetes mellitus with diabetic polyneuropathy: Secondary | ICD-10-CM | POA: Diagnosis not present

## 2016-02-10 DIAGNOSIS — Z8673 Personal history of transient ischemic attack (TIA), and cerebral infarction without residual deficits: Secondary | ICD-10-CM | POA: Diagnosis not present

## 2016-02-10 DIAGNOSIS — K589 Irritable bowel syndrome without diarrhea: Secondary | ICD-10-CM | POA: Diagnosis not present

## 2016-02-10 DIAGNOSIS — Z7902 Long term (current) use of antithrombotics/antiplatelets: Secondary | ICD-10-CM | POA: Diagnosis not present

## 2016-02-10 DIAGNOSIS — Z9181 History of falling: Secondary | ICD-10-CM | POA: Diagnosis not present

## 2016-02-10 DIAGNOSIS — M5417 Radiculopathy, lumbosacral region: Secondary | ICD-10-CM | POA: Diagnosis not present

## 2016-02-10 DIAGNOSIS — E559 Vitamin D deficiency, unspecified: Secondary | ICD-10-CM | POA: Diagnosis not present

## 2016-02-10 DIAGNOSIS — I714 Abdominal aortic aneurysm, without rupture: Secondary | ICD-10-CM | POA: Diagnosis not present

## 2016-02-10 DIAGNOSIS — E1151 Type 2 diabetes mellitus with diabetic peripheral angiopathy without gangrene: Secondary | ICD-10-CM | POA: Diagnosis not present

## 2016-02-10 DIAGNOSIS — I059 Rheumatic mitral valve disease, unspecified: Secondary | ICD-10-CM | POA: Diagnosis not present

## 2016-02-10 DIAGNOSIS — M419 Scoliosis, unspecified: Secondary | ICD-10-CM | POA: Diagnosis not present

## 2016-02-10 DIAGNOSIS — I2510 Atherosclerotic heart disease of native coronary artery without angina pectoris: Secondary | ICD-10-CM | POA: Diagnosis not present

## 2016-02-12 DIAGNOSIS — M419 Scoliosis, unspecified: Secondary | ICD-10-CM | POA: Diagnosis not present

## 2016-02-12 DIAGNOSIS — E1151 Type 2 diabetes mellitus with diabetic peripheral angiopathy without gangrene: Secondary | ICD-10-CM | POA: Diagnosis not present

## 2016-02-12 DIAGNOSIS — E1142 Type 2 diabetes mellitus with diabetic polyneuropathy: Secondary | ICD-10-CM | POA: Diagnosis not present

## 2016-02-12 DIAGNOSIS — I10 Essential (primary) hypertension: Secondary | ICD-10-CM | POA: Diagnosis not present

## 2016-02-12 DIAGNOSIS — R2689 Other abnormalities of gait and mobility: Secondary | ICD-10-CM | POA: Diagnosis not present

## 2016-02-12 DIAGNOSIS — M5417 Radiculopathy, lumbosacral region: Secondary | ICD-10-CM | POA: Diagnosis not present

## 2016-02-17 DIAGNOSIS — R2689 Other abnormalities of gait and mobility: Secondary | ICD-10-CM | POA: Diagnosis not present

## 2016-02-17 DIAGNOSIS — M5417 Radiculopathy, lumbosacral region: Secondary | ICD-10-CM | POA: Diagnosis not present

## 2016-02-17 DIAGNOSIS — E1151 Type 2 diabetes mellitus with diabetic peripheral angiopathy without gangrene: Secondary | ICD-10-CM | POA: Diagnosis not present

## 2016-02-17 DIAGNOSIS — I10 Essential (primary) hypertension: Secondary | ICD-10-CM | POA: Diagnosis not present

## 2016-02-17 DIAGNOSIS — E1142 Type 2 diabetes mellitus with diabetic polyneuropathy: Secondary | ICD-10-CM | POA: Diagnosis not present

## 2016-02-17 DIAGNOSIS — M419 Scoliosis, unspecified: Secondary | ICD-10-CM | POA: Diagnosis not present

## 2016-02-19 DIAGNOSIS — I10 Essential (primary) hypertension: Secondary | ICD-10-CM | POA: Diagnosis not present

## 2016-02-19 DIAGNOSIS — R2689 Other abnormalities of gait and mobility: Secondary | ICD-10-CM | POA: Diagnosis not present

## 2016-02-19 DIAGNOSIS — M5417 Radiculopathy, lumbosacral region: Secondary | ICD-10-CM | POA: Diagnosis not present

## 2016-02-19 DIAGNOSIS — E1142 Type 2 diabetes mellitus with diabetic polyneuropathy: Secondary | ICD-10-CM | POA: Diagnosis not present

## 2016-02-19 DIAGNOSIS — M419 Scoliosis, unspecified: Secondary | ICD-10-CM | POA: Diagnosis not present

## 2016-02-19 DIAGNOSIS — E1151 Type 2 diabetes mellitus with diabetic peripheral angiopathy without gangrene: Secondary | ICD-10-CM | POA: Diagnosis not present

## 2016-02-28 DIAGNOSIS — M5417 Radiculopathy, lumbosacral region: Secondary | ICD-10-CM | POA: Diagnosis not present

## 2016-02-28 DIAGNOSIS — E1151 Type 2 diabetes mellitus with diabetic peripheral angiopathy without gangrene: Secondary | ICD-10-CM | POA: Diagnosis not present

## 2016-02-28 DIAGNOSIS — E1142 Type 2 diabetes mellitus with diabetic polyneuropathy: Secondary | ICD-10-CM | POA: Diagnosis not present

## 2016-02-28 DIAGNOSIS — I10 Essential (primary) hypertension: Secondary | ICD-10-CM | POA: Diagnosis not present

## 2016-02-28 DIAGNOSIS — R2689 Other abnormalities of gait and mobility: Secondary | ICD-10-CM | POA: Diagnosis not present

## 2016-02-28 DIAGNOSIS — M419 Scoliosis, unspecified: Secondary | ICD-10-CM | POA: Diagnosis not present

## 2016-03-04 DIAGNOSIS — R2689 Other abnormalities of gait and mobility: Secondary | ICD-10-CM | POA: Diagnosis not present

## 2016-03-04 DIAGNOSIS — M419 Scoliosis, unspecified: Secondary | ICD-10-CM | POA: Diagnosis not present

## 2016-03-04 DIAGNOSIS — I10 Essential (primary) hypertension: Secondary | ICD-10-CM | POA: Diagnosis not present

## 2016-03-04 DIAGNOSIS — M5417 Radiculopathy, lumbosacral region: Secondary | ICD-10-CM | POA: Diagnosis not present

## 2016-03-04 DIAGNOSIS — R5383 Other fatigue: Secondary | ICD-10-CM | POA: Diagnosis not present

## 2016-03-04 DIAGNOSIS — E1142 Type 2 diabetes mellitus with diabetic polyneuropathy: Secondary | ICD-10-CM | POA: Diagnosis not present

## 2016-03-04 DIAGNOSIS — R399 Unspecified symptoms and signs involving the genitourinary system: Secondary | ICD-10-CM | POA: Diagnosis not present

## 2016-03-04 DIAGNOSIS — E1151 Type 2 diabetes mellitus with diabetic peripheral angiopathy without gangrene: Secondary | ICD-10-CM | POA: Diagnosis not present

## 2016-03-06 DIAGNOSIS — I10 Essential (primary) hypertension: Secondary | ICD-10-CM | POA: Diagnosis not present

## 2016-03-06 DIAGNOSIS — M419 Scoliosis, unspecified: Secondary | ICD-10-CM | POA: Diagnosis not present

## 2016-03-06 DIAGNOSIS — E1151 Type 2 diabetes mellitus with diabetic peripheral angiopathy without gangrene: Secondary | ICD-10-CM | POA: Diagnosis not present

## 2016-03-06 DIAGNOSIS — M5417 Radiculopathy, lumbosacral region: Secondary | ICD-10-CM | POA: Diagnosis not present

## 2016-03-06 DIAGNOSIS — R2689 Other abnormalities of gait and mobility: Secondary | ICD-10-CM | POA: Diagnosis not present

## 2016-03-06 DIAGNOSIS — E1142 Type 2 diabetes mellitus with diabetic polyneuropathy: Secondary | ICD-10-CM | POA: Diagnosis not present

## 2016-03-13 DIAGNOSIS — J209 Acute bronchitis, unspecified: Secondary | ICD-10-CM | POA: Diagnosis not present

## 2016-03-19 DIAGNOSIS — E1142 Type 2 diabetes mellitus with diabetic polyneuropathy: Secondary | ICD-10-CM | POA: Diagnosis not present

## 2016-03-19 DIAGNOSIS — M5417 Radiculopathy, lumbosacral region: Secondary | ICD-10-CM | POA: Diagnosis not present

## 2016-03-19 DIAGNOSIS — I10 Essential (primary) hypertension: Secondary | ICD-10-CM | POA: Diagnosis not present

## 2016-03-26 DIAGNOSIS — M810 Age-related osteoporosis without current pathological fracture: Secondary | ICD-10-CM | POA: Diagnosis not present

## 2016-04-14 DIAGNOSIS — H538 Other visual disturbances: Secondary | ICD-10-CM | POA: Diagnosis not present

## 2016-04-15 DIAGNOSIS — H04123 Dry eye syndrome of bilateral lacrimal glands: Secondary | ICD-10-CM | POA: Diagnosis not present

## 2016-04-15 DIAGNOSIS — H25813 Combined forms of age-related cataract, bilateral: Secondary | ICD-10-CM | POA: Diagnosis not present

## 2016-04-15 DIAGNOSIS — H5310 Unspecified subjective visual disturbances: Secondary | ICD-10-CM | POA: Diagnosis not present

## 2016-04-15 DIAGNOSIS — H35373 Puckering of macula, bilateral: Secondary | ICD-10-CM | POA: Diagnosis not present

## 2016-06-08 DIAGNOSIS — F322 Major depressive disorder, single episode, severe without psychotic features: Secondary | ICD-10-CM | POA: Diagnosis not present

## 2016-06-08 DIAGNOSIS — J438 Other emphysema: Secondary | ICD-10-CM | POA: Diagnosis not present

## 2016-08-10 DIAGNOSIS — I10 Essential (primary) hypertension: Secondary | ICD-10-CM | POA: Diagnosis not present

## 2016-08-10 DIAGNOSIS — E7800 Pure hypercholesterolemia, unspecified: Secondary | ICD-10-CM | POA: Diagnosis not present

## 2016-08-10 DIAGNOSIS — E559 Vitamin D deficiency, unspecified: Secondary | ICD-10-CM | POA: Diagnosis not present

## 2016-08-10 DIAGNOSIS — N390 Urinary tract infection, site not specified: Secondary | ICD-10-CM | POA: Diagnosis not present

## 2016-08-10 DIAGNOSIS — Z0000 Encounter for general adult medical examination without abnormal findings: Secondary | ICD-10-CM | POA: Diagnosis not present

## 2016-08-10 DIAGNOSIS — M8580 Other specified disorders of bone density and structure, unspecified site: Secondary | ICD-10-CM | POA: Diagnosis not present

## 2016-08-20 DIAGNOSIS — I340 Nonrheumatic mitral (valve) insufficiency: Secondary | ICD-10-CM | POA: Diagnosis not present

## 2016-08-20 DIAGNOSIS — F322 Major depressive disorder, single episode, severe without psychotic features: Secondary | ICD-10-CM | POA: Diagnosis not present

## 2016-08-20 DIAGNOSIS — G609 Hereditary and idiopathic neuropathy, unspecified: Secondary | ICD-10-CM | POA: Diagnosis not present

## 2016-08-20 DIAGNOSIS — E7800 Pure hypercholesterolemia, unspecified: Secondary | ICD-10-CM | POA: Diagnosis not present

## 2016-09-02 DIAGNOSIS — G5761 Lesion of plantar nerve, right lower limb: Secondary | ICD-10-CM | POA: Diagnosis not present

## 2016-09-02 DIAGNOSIS — M2011 Hallux valgus (acquired), right foot: Secondary | ICD-10-CM | POA: Diagnosis not present

## 2016-09-02 DIAGNOSIS — M7751 Other enthesopathy of right foot: Secondary | ICD-10-CM | POA: Diagnosis not present

## 2016-09-02 DIAGNOSIS — M79671 Pain in right foot: Secondary | ICD-10-CM | POA: Diagnosis not present

## 2016-09-10 DIAGNOSIS — L570 Actinic keratosis: Secondary | ICD-10-CM | POA: Diagnosis not present

## 2016-09-10 DIAGNOSIS — X32XXXA Exposure to sunlight, initial encounter: Secondary | ICD-10-CM | POA: Diagnosis not present

## 2016-09-17 DIAGNOSIS — G5761 Lesion of plantar nerve, right lower limb: Secondary | ICD-10-CM | POA: Diagnosis not present

## 2016-09-17 DIAGNOSIS — M7751 Other enthesopathy of right foot: Secondary | ICD-10-CM | POA: Diagnosis not present

## 2016-10-01 DIAGNOSIS — M810 Age-related osteoporosis without current pathological fracture: Secondary | ICD-10-CM | POA: Diagnosis not present

## 2016-10-29 DIAGNOSIS — M7751 Other enthesopathy of right foot: Secondary | ICD-10-CM | POA: Diagnosis not present

## 2016-10-29 DIAGNOSIS — G5761 Lesion of plantar nerve, right lower limb: Secondary | ICD-10-CM | POA: Diagnosis not present

## 2016-11-06 DIAGNOSIS — Z23 Encounter for immunization: Secondary | ICD-10-CM | POA: Diagnosis not present

## 2017-03-18 DIAGNOSIS — I6529 Occlusion and stenosis of unspecified carotid artery: Secondary | ICD-10-CM | POA: Diagnosis not present

## 2017-03-18 DIAGNOSIS — R011 Cardiac murmur, unspecified: Secondary | ICD-10-CM | POA: Diagnosis not present

## 2017-03-18 DIAGNOSIS — I10 Essential (primary) hypertension: Secondary | ICD-10-CM | POA: Diagnosis not present

## 2017-03-18 DIAGNOSIS — F329 Major depressive disorder, single episode, unspecified: Secondary | ICD-10-CM | POA: Diagnosis not present

## 2017-04-20 DIAGNOSIS — E785 Hyperlipidemia, unspecified: Secondary | ICD-10-CM | POA: Diagnosis not present

## 2017-04-20 DIAGNOSIS — Z79899 Other long term (current) drug therapy: Secondary | ICD-10-CM | POA: Diagnosis not present

## 2017-04-20 DIAGNOSIS — I10 Essential (primary) hypertension: Secondary | ICD-10-CM | POA: Diagnosis not present

## 2017-04-20 DIAGNOSIS — M810 Age-related osteoporosis without current pathological fracture: Secondary | ICD-10-CM | POA: Diagnosis not present

## 2017-04-20 DIAGNOSIS — K219 Gastro-esophageal reflux disease without esophagitis: Secondary | ICD-10-CM | POA: Diagnosis not present

## 2017-04-20 DIAGNOSIS — F329 Major depressive disorder, single episode, unspecified: Secondary | ICD-10-CM | POA: Diagnosis not present

## 2017-04-22 DIAGNOSIS — E785 Hyperlipidemia, unspecified: Secondary | ICD-10-CM | POA: Diagnosis not present

## 2017-04-22 DIAGNOSIS — F329 Major depressive disorder, single episode, unspecified: Secondary | ICD-10-CM | POA: Diagnosis not present

## 2017-04-22 DIAGNOSIS — I10 Essential (primary) hypertension: Secondary | ICD-10-CM | POA: Diagnosis not present

## 2017-04-22 DIAGNOSIS — I6529 Occlusion and stenosis of unspecified carotid artery: Secondary | ICD-10-CM | POA: Diagnosis not present

## 2017-04-23 DIAGNOSIS — I6523 Occlusion and stenosis of bilateral carotid arteries: Secondary | ICD-10-CM | POA: Diagnosis not present

## 2017-05-03 DIAGNOSIS — H1031 Unspecified acute conjunctivitis, right eye: Secondary | ICD-10-CM | POA: Diagnosis not present

## 2017-05-03 DIAGNOSIS — M810 Age-related osteoporosis without current pathological fracture: Secondary | ICD-10-CM | POA: Diagnosis not present

## 2017-05-03 DIAGNOSIS — Z8673 Personal history of transient ischemic attack (TIA), and cerebral infarction without residual deficits: Secondary | ICD-10-CM | POA: Diagnosis not present

## 2017-05-03 DIAGNOSIS — H04123 Dry eye syndrome of bilateral lacrimal glands: Secondary | ICD-10-CM | POA: Diagnosis not present

## 2017-05-03 DIAGNOSIS — H5440 Blindness, one eye, unspecified eye: Secondary | ICD-10-CM | POA: Diagnosis not present

## 2017-05-03 DIAGNOSIS — I10 Essential (primary) hypertension: Secondary | ICD-10-CM | POA: Diagnosis not present

## 2017-06-02 DIAGNOSIS — B351 Tinea unguium: Secondary | ICD-10-CM | POA: Diagnosis not present

## 2017-08-27 DIAGNOSIS — K219 Gastro-esophageal reflux disease without esophagitis: Secondary | ICD-10-CM | POA: Diagnosis not present

## 2017-08-27 DIAGNOSIS — M810 Age-related osteoporosis without current pathological fracture: Secondary | ICD-10-CM | POA: Diagnosis not present

## 2017-08-27 DIAGNOSIS — E785 Hyperlipidemia, unspecified: Secondary | ICD-10-CM | POA: Diagnosis not present

## 2017-08-27 DIAGNOSIS — Z79899 Other long term (current) drug therapy: Secondary | ICD-10-CM | POA: Diagnosis not present

## 2017-08-27 DIAGNOSIS — I10 Essential (primary) hypertension: Secondary | ICD-10-CM | POA: Diagnosis not present

## 2017-08-27 DIAGNOSIS — R7301 Impaired fasting glucose: Secondary | ICD-10-CM | POA: Diagnosis not present

## 2017-08-27 DIAGNOSIS — I6529 Occlusion and stenosis of unspecified carotid artery: Secondary | ICD-10-CM | POA: Diagnosis not present

## 2017-09-01 DIAGNOSIS — E785 Hyperlipidemia, unspecified: Secondary | ICD-10-CM | POA: Diagnosis not present

## 2017-09-01 DIAGNOSIS — R8290 Unspecified abnormal findings in urine: Secondary | ICD-10-CM | POA: Diagnosis not present

## 2017-09-01 DIAGNOSIS — I10 Essential (primary) hypertension: Secondary | ICD-10-CM | POA: Diagnosis not present

## 2017-09-01 DIAGNOSIS — Z1389 Encounter for screening for other disorder: Secondary | ICD-10-CM | POA: Diagnosis not present

## 2017-09-01 DIAGNOSIS — F329 Major depressive disorder, single episode, unspecified: Secondary | ICD-10-CM | POA: Diagnosis not present

## 2017-09-03 DIAGNOSIS — Z23 Encounter for immunization: Secondary | ICD-10-CM | POA: Diagnosis not present

## 2017-09-15 DIAGNOSIS — R41844 Frontal lobe and executive function deficit: Secondary | ICD-10-CM | POA: Diagnosis not present

## 2017-09-15 DIAGNOSIS — M6281 Muscle weakness (generalized): Secondary | ICD-10-CM | POA: Diagnosis not present

## 2017-09-15 DIAGNOSIS — R278 Other lack of coordination: Secondary | ICD-10-CM | POA: Diagnosis not present

## 2017-09-15 DIAGNOSIS — R2681 Unsteadiness on feet: Secondary | ICD-10-CM | POA: Diagnosis not present

## 2017-09-16 DIAGNOSIS — R278 Other lack of coordination: Secondary | ICD-10-CM | POA: Diagnosis not present

## 2017-09-16 DIAGNOSIS — R41844 Frontal lobe and executive function deficit: Secondary | ICD-10-CM | POA: Diagnosis not present

## 2017-09-16 DIAGNOSIS — R2681 Unsteadiness on feet: Secondary | ICD-10-CM | POA: Diagnosis not present

## 2017-09-16 DIAGNOSIS — M6281 Muscle weakness (generalized): Secondary | ICD-10-CM | POA: Diagnosis not present

## 2017-09-17 DIAGNOSIS — R41844 Frontal lobe and executive function deficit: Secondary | ICD-10-CM | POA: Diagnosis not present

## 2017-09-17 DIAGNOSIS — R2681 Unsteadiness on feet: Secondary | ICD-10-CM | POA: Diagnosis not present

## 2017-09-17 DIAGNOSIS — R278 Other lack of coordination: Secondary | ICD-10-CM | POA: Diagnosis not present

## 2017-09-17 DIAGNOSIS — M6281 Muscle weakness (generalized): Secondary | ICD-10-CM | POA: Diagnosis not present

## 2017-09-20 DIAGNOSIS — M6281 Muscle weakness (generalized): Secondary | ICD-10-CM | POA: Diagnosis not present

## 2017-09-20 DIAGNOSIS — R41844 Frontal lobe and executive function deficit: Secondary | ICD-10-CM | POA: Diagnosis not present

## 2017-09-20 DIAGNOSIS — R278 Other lack of coordination: Secondary | ICD-10-CM | POA: Diagnosis not present

## 2017-09-20 DIAGNOSIS — R2681 Unsteadiness on feet: Secondary | ICD-10-CM | POA: Diagnosis not present

## 2017-09-21 DIAGNOSIS — R41844 Frontal lobe and executive function deficit: Secondary | ICD-10-CM | POA: Diagnosis not present

## 2017-09-21 DIAGNOSIS — R278 Other lack of coordination: Secondary | ICD-10-CM | POA: Diagnosis not present

## 2017-09-21 DIAGNOSIS — M6281 Muscle weakness (generalized): Secondary | ICD-10-CM | POA: Diagnosis not present

## 2017-09-21 DIAGNOSIS — R2681 Unsteadiness on feet: Secondary | ICD-10-CM | POA: Diagnosis not present

## 2017-09-22 DIAGNOSIS — R41844 Frontal lobe and executive function deficit: Secondary | ICD-10-CM | POA: Diagnosis not present

## 2017-09-22 DIAGNOSIS — M6281 Muscle weakness (generalized): Secondary | ICD-10-CM | POA: Diagnosis not present

## 2017-09-22 DIAGNOSIS — R2681 Unsteadiness on feet: Secondary | ICD-10-CM | POA: Diagnosis not present

## 2017-09-22 DIAGNOSIS — R278 Other lack of coordination: Secondary | ICD-10-CM | POA: Diagnosis not present

## 2017-09-23 DIAGNOSIS — R2681 Unsteadiness on feet: Secondary | ICD-10-CM | POA: Diagnosis not present

## 2017-09-23 DIAGNOSIS — R41844 Frontal lobe and executive function deficit: Secondary | ICD-10-CM | POA: Diagnosis not present

## 2017-09-23 DIAGNOSIS — R278 Other lack of coordination: Secondary | ICD-10-CM | POA: Diagnosis not present

## 2017-09-23 DIAGNOSIS — M6281 Muscle weakness (generalized): Secondary | ICD-10-CM | POA: Diagnosis not present

## 2017-09-27 DIAGNOSIS — R278 Other lack of coordination: Secondary | ICD-10-CM | POA: Diagnosis not present

## 2017-09-27 DIAGNOSIS — R41844 Frontal lobe and executive function deficit: Secondary | ICD-10-CM | POA: Diagnosis not present

## 2017-09-27 DIAGNOSIS — M6281 Muscle weakness (generalized): Secondary | ICD-10-CM | POA: Diagnosis not present

## 2017-09-27 DIAGNOSIS — R2681 Unsteadiness on feet: Secondary | ICD-10-CM | POA: Diagnosis not present

## 2017-09-30 DIAGNOSIS — R2681 Unsteadiness on feet: Secondary | ICD-10-CM | POA: Diagnosis not present

## 2017-09-30 DIAGNOSIS — M6281 Muscle weakness (generalized): Secondary | ICD-10-CM | POA: Diagnosis not present

## 2017-09-30 DIAGNOSIS — R278 Other lack of coordination: Secondary | ICD-10-CM | POA: Diagnosis not present

## 2017-09-30 DIAGNOSIS — R41844 Frontal lobe and executive function deficit: Secondary | ICD-10-CM | POA: Diagnosis not present

## 2017-10-01 DIAGNOSIS — M6281 Muscle weakness (generalized): Secondary | ICD-10-CM | POA: Diagnosis not present

## 2017-10-01 DIAGNOSIS — R2681 Unsteadiness on feet: Secondary | ICD-10-CM | POA: Diagnosis not present

## 2017-10-01 DIAGNOSIS — R278 Other lack of coordination: Secondary | ICD-10-CM | POA: Diagnosis not present

## 2017-10-01 DIAGNOSIS — R41844 Frontal lobe and executive function deficit: Secondary | ICD-10-CM | POA: Diagnosis not present

## 2017-10-05 DIAGNOSIS — R2681 Unsteadiness on feet: Secondary | ICD-10-CM | POA: Diagnosis not present

## 2017-10-05 DIAGNOSIS — R41844 Frontal lobe and executive function deficit: Secondary | ICD-10-CM | POA: Diagnosis not present

## 2017-10-05 DIAGNOSIS — R278 Other lack of coordination: Secondary | ICD-10-CM | POA: Diagnosis not present

## 2017-10-05 DIAGNOSIS — M6281 Muscle weakness (generalized): Secondary | ICD-10-CM | POA: Diagnosis not present

## 2017-10-07 DIAGNOSIS — R41844 Frontal lobe and executive function deficit: Secondary | ICD-10-CM | POA: Diagnosis not present

## 2017-10-07 DIAGNOSIS — R278 Other lack of coordination: Secondary | ICD-10-CM | POA: Diagnosis not present

## 2017-10-07 DIAGNOSIS — R2681 Unsteadiness on feet: Secondary | ICD-10-CM | POA: Diagnosis not present

## 2017-10-07 DIAGNOSIS — M6281 Muscle weakness (generalized): Secondary | ICD-10-CM | POA: Diagnosis not present

## 2017-10-11 DIAGNOSIS — R278 Other lack of coordination: Secondary | ICD-10-CM | POA: Diagnosis not present

## 2017-10-11 DIAGNOSIS — R41844 Frontal lobe and executive function deficit: Secondary | ICD-10-CM | POA: Diagnosis not present

## 2017-10-11 DIAGNOSIS — M6281 Muscle weakness (generalized): Secondary | ICD-10-CM | POA: Diagnosis not present

## 2017-10-11 DIAGNOSIS — R2681 Unsteadiness on feet: Secondary | ICD-10-CM | POA: Diagnosis not present

## 2017-10-12 DIAGNOSIS — R2681 Unsteadiness on feet: Secondary | ICD-10-CM | POA: Diagnosis not present

## 2017-10-12 DIAGNOSIS — R278 Other lack of coordination: Secondary | ICD-10-CM | POA: Diagnosis not present

## 2017-10-12 DIAGNOSIS — M6281 Muscle weakness (generalized): Secondary | ICD-10-CM | POA: Diagnosis not present

## 2017-10-12 DIAGNOSIS — I10 Essential (primary) hypertension: Secondary | ICD-10-CM | POA: Diagnosis not present

## 2017-10-12 DIAGNOSIS — R011 Cardiac murmur, unspecified: Secondary | ICD-10-CM | POA: Diagnosis not present

## 2017-10-12 DIAGNOSIS — R41844 Frontal lobe and executive function deficit: Secondary | ICD-10-CM | POA: Diagnosis not present

## 2017-10-13 DIAGNOSIS — M6281 Muscle weakness (generalized): Secondary | ICD-10-CM | POA: Diagnosis not present

## 2017-10-13 DIAGNOSIS — R41844 Frontal lobe and executive function deficit: Secondary | ICD-10-CM | POA: Diagnosis not present

## 2017-10-13 DIAGNOSIS — R2681 Unsteadiness on feet: Secondary | ICD-10-CM | POA: Diagnosis not present

## 2017-10-13 DIAGNOSIS — R278 Other lack of coordination: Secondary | ICD-10-CM | POA: Diagnosis not present

## 2017-10-14 DIAGNOSIS — R41844 Frontal lobe and executive function deficit: Secondary | ICD-10-CM | POA: Diagnosis not present

## 2017-10-14 DIAGNOSIS — R278 Other lack of coordination: Secondary | ICD-10-CM | POA: Diagnosis not present

## 2017-10-14 DIAGNOSIS — M6281 Muscle weakness (generalized): Secondary | ICD-10-CM | POA: Diagnosis not present

## 2017-10-14 DIAGNOSIS — R2681 Unsteadiness on feet: Secondary | ICD-10-CM | POA: Diagnosis not present

## 2017-10-18 DIAGNOSIS — R278 Other lack of coordination: Secondary | ICD-10-CM | POA: Diagnosis not present

## 2017-10-18 DIAGNOSIS — R41844 Frontal lobe and executive function deficit: Secondary | ICD-10-CM | POA: Diagnosis not present

## 2017-10-18 DIAGNOSIS — R2681 Unsteadiness on feet: Secondary | ICD-10-CM | POA: Diagnosis not present

## 2017-10-18 DIAGNOSIS — M6281 Muscle weakness (generalized): Secondary | ICD-10-CM | POA: Diagnosis not present

## 2017-10-20 DIAGNOSIS — R278 Other lack of coordination: Secondary | ICD-10-CM | POA: Diagnosis not present

## 2017-10-20 DIAGNOSIS — M6281 Muscle weakness (generalized): Secondary | ICD-10-CM | POA: Diagnosis not present

## 2017-10-20 DIAGNOSIS — R41844 Frontal lobe and executive function deficit: Secondary | ICD-10-CM | POA: Diagnosis not present

## 2017-10-20 DIAGNOSIS — R2681 Unsteadiness on feet: Secondary | ICD-10-CM | POA: Diagnosis not present

## 2017-10-22 DIAGNOSIS — R2681 Unsteadiness on feet: Secondary | ICD-10-CM | POA: Diagnosis not present

## 2017-10-22 DIAGNOSIS — R41844 Frontal lobe and executive function deficit: Secondary | ICD-10-CM | POA: Diagnosis not present

## 2017-10-22 DIAGNOSIS — R278 Other lack of coordination: Secondary | ICD-10-CM | POA: Diagnosis not present

## 2017-10-22 DIAGNOSIS — M6281 Muscle weakness (generalized): Secondary | ICD-10-CM | POA: Diagnosis not present

## 2017-10-26 DIAGNOSIS — R41844 Frontal lobe and executive function deficit: Secondary | ICD-10-CM | POA: Diagnosis not present

## 2017-10-26 DIAGNOSIS — R278 Other lack of coordination: Secondary | ICD-10-CM | POA: Diagnosis not present

## 2017-10-26 DIAGNOSIS — R2681 Unsteadiness on feet: Secondary | ICD-10-CM | POA: Diagnosis not present

## 2017-10-26 DIAGNOSIS — M6281 Muscle weakness (generalized): Secondary | ICD-10-CM | POA: Diagnosis not present

## 2017-10-27 DIAGNOSIS — R41844 Frontal lobe and executive function deficit: Secondary | ICD-10-CM | POA: Diagnosis not present

## 2017-10-27 DIAGNOSIS — R2681 Unsteadiness on feet: Secondary | ICD-10-CM | POA: Diagnosis not present

## 2017-10-27 DIAGNOSIS — M6281 Muscle weakness (generalized): Secondary | ICD-10-CM | POA: Diagnosis not present

## 2017-10-27 DIAGNOSIS — R278 Other lack of coordination: Secondary | ICD-10-CM | POA: Diagnosis not present

## 2017-10-29 DIAGNOSIS — M6281 Muscle weakness (generalized): Secondary | ICD-10-CM | POA: Diagnosis not present

## 2017-10-29 DIAGNOSIS — R2681 Unsteadiness on feet: Secondary | ICD-10-CM | POA: Diagnosis not present

## 2017-10-29 DIAGNOSIS — R41844 Frontal lobe and executive function deficit: Secondary | ICD-10-CM | POA: Diagnosis not present

## 2017-10-29 DIAGNOSIS — R278 Other lack of coordination: Secondary | ICD-10-CM | POA: Diagnosis not present

## 2017-11-01 DIAGNOSIS — R278 Other lack of coordination: Secondary | ICD-10-CM | POA: Diagnosis not present

## 2017-11-01 DIAGNOSIS — M6281 Muscle weakness (generalized): Secondary | ICD-10-CM | POA: Diagnosis not present

## 2017-11-01 DIAGNOSIS — R2681 Unsteadiness on feet: Secondary | ICD-10-CM | POA: Diagnosis not present

## 2017-11-01 DIAGNOSIS — R41844 Frontal lobe and executive function deficit: Secondary | ICD-10-CM | POA: Diagnosis not present

## 2017-11-03 DIAGNOSIS — R278 Other lack of coordination: Secondary | ICD-10-CM | POA: Diagnosis not present

## 2017-11-03 DIAGNOSIS — M6281 Muscle weakness (generalized): Secondary | ICD-10-CM | POA: Diagnosis not present

## 2017-11-03 DIAGNOSIS — R2681 Unsteadiness on feet: Secondary | ICD-10-CM | POA: Diagnosis not present

## 2017-11-03 DIAGNOSIS — R41844 Frontal lobe and executive function deficit: Secondary | ICD-10-CM | POA: Diagnosis not present

## 2017-11-05 DIAGNOSIS — R41844 Frontal lobe and executive function deficit: Secondary | ICD-10-CM | POA: Diagnosis not present

## 2017-11-05 DIAGNOSIS — R278 Other lack of coordination: Secondary | ICD-10-CM | POA: Diagnosis not present

## 2017-11-05 DIAGNOSIS — R2681 Unsteadiness on feet: Secondary | ICD-10-CM | POA: Diagnosis not present

## 2017-11-05 DIAGNOSIS — M6281 Muscle weakness (generalized): Secondary | ICD-10-CM | POA: Diagnosis not present

## 2017-11-06 DIAGNOSIS — Z23 Encounter for immunization: Secondary | ICD-10-CM | POA: Diagnosis not present

## 2017-11-08 DIAGNOSIS — M6281 Muscle weakness (generalized): Secondary | ICD-10-CM | POA: Diagnosis not present

## 2017-11-08 DIAGNOSIS — R278 Other lack of coordination: Secondary | ICD-10-CM | POA: Diagnosis not present

## 2017-11-08 DIAGNOSIS — R2681 Unsteadiness on feet: Secondary | ICD-10-CM | POA: Diagnosis not present

## 2017-11-08 DIAGNOSIS — R41844 Frontal lobe and executive function deficit: Secondary | ICD-10-CM | POA: Diagnosis not present

## 2017-11-09 DIAGNOSIS — M810 Age-related osteoporosis without current pathological fracture: Secondary | ICD-10-CM | POA: Diagnosis not present

## 2017-11-11 DIAGNOSIS — R278 Other lack of coordination: Secondary | ICD-10-CM | POA: Diagnosis not present

## 2017-11-11 DIAGNOSIS — M6281 Muscle weakness (generalized): Secondary | ICD-10-CM | POA: Diagnosis not present

## 2017-11-11 DIAGNOSIS — R2681 Unsteadiness on feet: Secondary | ICD-10-CM | POA: Diagnosis not present

## 2017-11-11 DIAGNOSIS — R41844 Frontal lobe and executive function deficit: Secondary | ICD-10-CM | POA: Diagnosis not present

## 2017-11-12 DIAGNOSIS — R278 Other lack of coordination: Secondary | ICD-10-CM | POA: Diagnosis not present

## 2017-11-12 DIAGNOSIS — R2681 Unsteadiness on feet: Secondary | ICD-10-CM | POA: Diagnosis not present

## 2017-11-12 DIAGNOSIS — M6281 Muscle weakness (generalized): Secondary | ICD-10-CM | POA: Diagnosis not present

## 2017-11-12 DIAGNOSIS — R41844 Frontal lobe and executive function deficit: Secondary | ICD-10-CM | POA: Diagnosis not present

## 2017-11-15 DIAGNOSIS — R2681 Unsteadiness on feet: Secondary | ICD-10-CM | POA: Diagnosis not present

## 2017-11-15 DIAGNOSIS — R278 Other lack of coordination: Secondary | ICD-10-CM | POA: Diagnosis not present

## 2017-11-15 DIAGNOSIS — R41844 Frontal lobe and executive function deficit: Secondary | ICD-10-CM | POA: Diagnosis not present

## 2017-11-15 DIAGNOSIS — M6281 Muscle weakness (generalized): Secondary | ICD-10-CM | POA: Diagnosis not present

## 2017-11-17 DIAGNOSIS — M6281 Muscle weakness (generalized): Secondary | ICD-10-CM | POA: Diagnosis not present

## 2017-11-17 DIAGNOSIS — R41844 Frontal lobe and executive function deficit: Secondary | ICD-10-CM | POA: Diagnosis not present

## 2017-11-17 DIAGNOSIS — R2681 Unsteadiness on feet: Secondary | ICD-10-CM | POA: Diagnosis not present

## 2017-11-17 DIAGNOSIS — R278 Other lack of coordination: Secondary | ICD-10-CM | POA: Diagnosis not present

## 2017-11-18 DIAGNOSIS — R278 Other lack of coordination: Secondary | ICD-10-CM | POA: Diagnosis not present

## 2017-11-18 DIAGNOSIS — R41844 Frontal lobe and executive function deficit: Secondary | ICD-10-CM | POA: Diagnosis not present

## 2017-11-18 DIAGNOSIS — M6281 Muscle weakness (generalized): Secondary | ICD-10-CM | POA: Diagnosis not present

## 2017-11-18 DIAGNOSIS — R2681 Unsteadiness on feet: Secondary | ICD-10-CM | POA: Diagnosis not present

## 2017-11-19 DIAGNOSIS — R2681 Unsteadiness on feet: Secondary | ICD-10-CM | POA: Diagnosis not present

## 2017-11-19 DIAGNOSIS — R41844 Frontal lobe and executive function deficit: Secondary | ICD-10-CM | POA: Diagnosis not present

## 2017-11-19 DIAGNOSIS — M6281 Muscle weakness (generalized): Secondary | ICD-10-CM | POA: Diagnosis not present

## 2017-11-19 DIAGNOSIS — R278 Other lack of coordination: Secondary | ICD-10-CM | POA: Diagnosis not present

## 2017-11-22 DIAGNOSIS — R278 Other lack of coordination: Secondary | ICD-10-CM | POA: Diagnosis not present

## 2017-11-22 DIAGNOSIS — M6281 Muscle weakness (generalized): Secondary | ICD-10-CM | POA: Diagnosis not present

## 2017-11-22 DIAGNOSIS — R41844 Frontal lobe and executive function deficit: Secondary | ICD-10-CM | POA: Diagnosis not present

## 2017-11-22 DIAGNOSIS — R2681 Unsteadiness on feet: Secondary | ICD-10-CM | POA: Diagnosis not present

## 2017-11-23 DIAGNOSIS — R278 Other lack of coordination: Secondary | ICD-10-CM | POA: Diagnosis not present

## 2017-11-23 DIAGNOSIS — R41844 Frontal lobe and executive function deficit: Secondary | ICD-10-CM | POA: Diagnosis not present

## 2017-11-23 DIAGNOSIS — M6281 Muscle weakness (generalized): Secondary | ICD-10-CM | POA: Diagnosis not present

## 2017-11-23 DIAGNOSIS — R2681 Unsteadiness on feet: Secondary | ICD-10-CM | POA: Diagnosis not present

## 2017-11-25 DIAGNOSIS — R278 Other lack of coordination: Secondary | ICD-10-CM | POA: Diagnosis not present

## 2017-11-25 DIAGNOSIS — R2681 Unsteadiness on feet: Secondary | ICD-10-CM | POA: Diagnosis not present

## 2017-11-25 DIAGNOSIS — R41844 Frontal lobe and executive function deficit: Secondary | ICD-10-CM | POA: Diagnosis not present

## 2017-11-25 DIAGNOSIS — M6281 Muscle weakness (generalized): Secondary | ICD-10-CM | POA: Diagnosis not present

## 2017-11-29 DIAGNOSIS — R278 Other lack of coordination: Secondary | ICD-10-CM | POA: Diagnosis not present

## 2017-11-29 DIAGNOSIS — R2681 Unsteadiness on feet: Secondary | ICD-10-CM | POA: Diagnosis not present

## 2017-11-29 DIAGNOSIS — M6281 Muscle weakness (generalized): Secondary | ICD-10-CM | POA: Diagnosis not present

## 2017-11-29 DIAGNOSIS — R41844 Frontal lobe and executive function deficit: Secondary | ICD-10-CM | POA: Diagnosis not present

## 2017-12-01 DIAGNOSIS — R2681 Unsteadiness on feet: Secondary | ICD-10-CM | POA: Diagnosis not present

## 2017-12-01 DIAGNOSIS — M6281 Muscle weakness (generalized): Secondary | ICD-10-CM | POA: Diagnosis not present

## 2017-12-01 DIAGNOSIS — R278 Other lack of coordination: Secondary | ICD-10-CM | POA: Diagnosis not present

## 2017-12-01 DIAGNOSIS — R41844 Frontal lobe and executive function deficit: Secondary | ICD-10-CM | POA: Diagnosis not present

## 2017-12-03 DIAGNOSIS — R2681 Unsteadiness on feet: Secondary | ICD-10-CM | POA: Diagnosis not present

## 2017-12-03 DIAGNOSIS — R278 Other lack of coordination: Secondary | ICD-10-CM | POA: Diagnosis not present

## 2017-12-03 DIAGNOSIS — M6281 Muscle weakness (generalized): Secondary | ICD-10-CM | POA: Diagnosis not present

## 2017-12-03 DIAGNOSIS — R41844 Frontal lobe and executive function deficit: Secondary | ICD-10-CM | POA: Diagnosis not present

## 2017-12-06 DIAGNOSIS — M6281 Muscle weakness (generalized): Secondary | ICD-10-CM | POA: Diagnosis not present

## 2017-12-06 DIAGNOSIS — R278 Other lack of coordination: Secondary | ICD-10-CM | POA: Diagnosis not present

## 2017-12-06 DIAGNOSIS — R41844 Frontal lobe and executive function deficit: Secondary | ICD-10-CM | POA: Diagnosis not present

## 2017-12-06 DIAGNOSIS — R2681 Unsteadiness on feet: Secondary | ICD-10-CM | POA: Diagnosis not present

## 2017-12-08 DIAGNOSIS — R278 Other lack of coordination: Secondary | ICD-10-CM | POA: Diagnosis not present

## 2017-12-08 DIAGNOSIS — M6281 Muscle weakness (generalized): Secondary | ICD-10-CM | POA: Diagnosis not present

## 2017-12-08 DIAGNOSIS — R2681 Unsteadiness on feet: Secondary | ICD-10-CM | POA: Diagnosis not present

## 2017-12-08 DIAGNOSIS — R41844 Frontal lobe and executive function deficit: Secondary | ICD-10-CM | POA: Diagnosis not present

## 2017-12-09 DIAGNOSIS — R2681 Unsteadiness on feet: Secondary | ICD-10-CM | POA: Diagnosis not present

## 2017-12-09 DIAGNOSIS — M6281 Muscle weakness (generalized): Secondary | ICD-10-CM | POA: Diagnosis not present

## 2017-12-09 DIAGNOSIS — R41844 Frontal lobe and executive function deficit: Secondary | ICD-10-CM | POA: Diagnosis not present

## 2017-12-09 DIAGNOSIS — R278 Other lack of coordination: Secondary | ICD-10-CM | POA: Diagnosis not present

## 2017-12-10 DIAGNOSIS — R278 Other lack of coordination: Secondary | ICD-10-CM | POA: Diagnosis not present

## 2017-12-10 DIAGNOSIS — R41844 Frontal lobe and executive function deficit: Secondary | ICD-10-CM | POA: Diagnosis not present

## 2017-12-10 DIAGNOSIS — R2681 Unsteadiness on feet: Secondary | ICD-10-CM | POA: Diagnosis not present

## 2017-12-10 DIAGNOSIS — M6281 Muscle weakness (generalized): Secondary | ICD-10-CM | POA: Diagnosis not present

## 2017-12-13 DIAGNOSIS — R41844 Frontal lobe and executive function deficit: Secondary | ICD-10-CM | POA: Diagnosis not present

## 2017-12-13 DIAGNOSIS — R2681 Unsteadiness on feet: Secondary | ICD-10-CM | POA: Diagnosis not present

## 2017-12-13 DIAGNOSIS — M6281 Muscle weakness (generalized): Secondary | ICD-10-CM | POA: Diagnosis not present

## 2017-12-13 DIAGNOSIS — R278 Other lack of coordination: Secondary | ICD-10-CM | POA: Diagnosis not present

## 2017-12-15 DIAGNOSIS — M6281 Muscle weakness (generalized): Secondary | ICD-10-CM | POA: Diagnosis not present

## 2017-12-15 DIAGNOSIS — R2681 Unsteadiness on feet: Secondary | ICD-10-CM | POA: Diagnosis not present

## 2017-12-15 DIAGNOSIS — R278 Other lack of coordination: Secondary | ICD-10-CM | POA: Diagnosis not present

## 2017-12-15 DIAGNOSIS — R41844 Frontal lobe and executive function deficit: Secondary | ICD-10-CM | POA: Diagnosis not present

## 2017-12-17 DIAGNOSIS — R2681 Unsteadiness on feet: Secondary | ICD-10-CM | POA: Diagnosis not present

## 2017-12-17 DIAGNOSIS — M6281 Muscle weakness (generalized): Secondary | ICD-10-CM | POA: Diagnosis not present

## 2017-12-17 DIAGNOSIS — R278 Other lack of coordination: Secondary | ICD-10-CM | POA: Diagnosis not present

## 2017-12-17 DIAGNOSIS — R41844 Frontal lobe and executive function deficit: Secondary | ICD-10-CM | POA: Diagnosis not present

## 2017-12-20 DIAGNOSIS — R2681 Unsteadiness on feet: Secondary | ICD-10-CM | POA: Diagnosis not present

## 2017-12-20 DIAGNOSIS — R278 Other lack of coordination: Secondary | ICD-10-CM | POA: Diagnosis not present

## 2017-12-20 DIAGNOSIS — R41844 Frontal lobe and executive function deficit: Secondary | ICD-10-CM | POA: Diagnosis not present

## 2017-12-20 DIAGNOSIS — M6281 Muscle weakness (generalized): Secondary | ICD-10-CM | POA: Diagnosis not present

## 2017-12-21 DIAGNOSIS — R278 Other lack of coordination: Secondary | ICD-10-CM | POA: Diagnosis not present

## 2017-12-21 DIAGNOSIS — R2681 Unsteadiness on feet: Secondary | ICD-10-CM | POA: Diagnosis not present

## 2017-12-21 DIAGNOSIS — R41844 Frontal lobe and executive function deficit: Secondary | ICD-10-CM | POA: Diagnosis not present

## 2017-12-21 DIAGNOSIS — M6281 Muscle weakness (generalized): Secondary | ICD-10-CM | POA: Diagnosis not present

## 2017-12-22 DIAGNOSIS — R278 Other lack of coordination: Secondary | ICD-10-CM | POA: Diagnosis not present

## 2017-12-22 DIAGNOSIS — R41844 Frontal lobe and executive function deficit: Secondary | ICD-10-CM | POA: Diagnosis not present

## 2017-12-22 DIAGNOSIS — M6281 Muscle weakness (generalized): Secondary | ICD-10-CM | POA: Diagnosis not present

## 2017-12-22 DIAGNOSIS — R2681 Unsteadiness on feet: Secondary | ICD-10-CM | POA: Diagnosis not present

## 2017-12-26 DIAGNOSIS — R278 Other lack of coordination: Secondary | ICD-10-CM | POA: Diagnosis not present

## 2017-12-26 DIAGNOSIS — R2681 Unsteadiness on feet: Secondary | ICD-10-CM | POA: Diagnosis not present

## 2017-12-26 DIAGNOSIS — M6281 Muscle weakness (generalized): Secondary | ICD-10-CM | POA: Diagnosis not present

## 2017-12-26 DIAGNOSIS — R41844 Frontal lobe and executive function deficit: Secondary | ICD-10-CM | POA: Diagnosis not present

## 2017-12-27 DIAGNOSIS — R278 Other lack of coordination: Secondary | ICD-10-CM | POA: Diagnosis not present

## 2017-12-27 DIAGNOSIS — M6281 Muscle weakness (generalized): Secondary | ICD-10-CM | POA: Diagnosis not present

## 2017-12-27 DIAGNOSIS — R41844 Frontal lobe and executive function deficit: Secondary | ICD-10-CM | POA: Diagnosis not present

## 2017-12-27 DIAGNOSIS — R2681 Unsteadiness on feet: Secondary | ICD-10-CM | POA: Diagnosis not present

## 2017-12-28 DIAGNOSIS — R41844 Frontal lobe and executive function deficit: Secondary | ICD-10-CM | POA: Diagnosis not present

## 2017-12-28 DIAGNOSIS — M6281 Muscle weakness (generalized): Secondary | ICD-10-CM | POA: Diagnosis not present

## 2017-12-28 DIAGNOSIS — R2681 Unsteadiness on feet: Secondary | ICD-10-CM | POA: Diagnosis not present

## 2017-12-28 DIAGNOSIS — R278 Other lack of coordination: Secondary | ICD-10-CM | POA: Diagnosis not present

## 2017-12-29 DIAGNOSIS — R278 Other lack of coordination: Secondary | ICD-10-CM | POA: Diagnosis not present

## 2017-12-29 DIAGNOSIS — R2681 Unsteadiness on feet: Secondary | ICD-10-CM | POA: Diagnosis not present

## 2017-12-29 DIAGNOSIS — R41844 Frontal lobe and executive function deficit: Secondary | ICD-10-CM | POA: Diagnosis not present

## 2017-12-29 DIAGNOSIS — M6281 Muscle weakness (generalized): Secondary | ICD-10-CM | POA: Diagnosis not present

## 2018-01-03 DIAGNOSIS — R278 Other lack of coordination: Secondary | ICD-10-CM | POA: Diagnosis not present

## 2018-01-03 DIAGNOSIS — R2681 Unsteadiness on feet: Secondary | ICD-10-CM | POA: Diagnosis not present

## 2018-01-03 DIAGNOSIS — R41844 Frontal lobe and executive function deficit: Secondary | ICD-10-CM | POA: Diagnosis not present

## 2018-01-03 DIAGNOSIS — M6281 Muscle weakness (generalized): Secondary | ICD-10-CM | POA: Diagnosis not present

## 2018-01-04 DIAGNOSIS — R278 Other lack of coordination: Secondary | ICD-10-CM | POA: Diagnosis not present

## 2018-01-04 DIAGNOSIS — R41844 Frontal lobe and executive function deficit: Secondary | ICD-10-CM | POA: Diagnosis not present

## 2018-01-04 DIAGNOSIS — M6281 Muscle weakness (generalized): Secondary | ICD-10-CM | POA: Diagnosis not present

## 2018-01-04 DIAGNOSIS — R2681 Unsteadiness on feet: Secondary | ICD-10-CM | POA: Diagnosis not present

## 2018-01-06 DIAGNOSIS — R278 Other lack of coordination: Secondary | ICD-10-CM | POA: Diagnosis not present

## 2018-01-06 DIAGNOSIS — M6281 Muscle weakness (generalized): Secondary | ICD-10-CM | POA: Diagnosis not present

## 2018-01-06 DIAGNOSIS — R2681 Unsteadiness on feet: Secondary | ICD-10-CM | POA: Diagnosis not present

## 2018-01-06 DIAGNOSIS — R41844 Frontal lobe and executive function deficit: Secondary | ICD-10-CM | POA: Diagnosis not present

## 2018-01-07 DIAGNOSIS — R2681 Unsteadiness on feet: Secondary | ICD-10-CM | POA: Diagnosis not present

## 2018-01-07 DIAGNOSIS — R41844 Frontal lobe and executive function deficit: Secondary | ICD-10-CM | POA: Diagnosis not present

## 2018-01-07 DIAGNOSIS — M6281 Muscle weakness (generalized): Secondary | ICD-10-CM | POA: Diagnosis not present

## 2018-01-07 DIAGNOSIS — R278 Other lack of coordination: Secondary | ICD-10-CM | POA: Diagnosis not present

## 2018-01-10 DIAGNOSIS — R41844 Frontal lobe and executive function deficit: Secondary | ICD-10-CM | POA: Diagnosis not present

## 2018-01-10 DIAGNOSIS — R2681 Unsteadiness on feet: Secondary | ICD-10-CM | POA: Diagnosis not present

## 2018-01-10 DIAGNOSIS — R278 Other lack of coordination: Secondary | ICD-10-CM | POA: Diagnosis not present

## 2018-01-10 DIAGNOSIS — M6281 Muscle weakness (generalized): Secondary | ICD-10-CM | POA: Diagnosis not present

## 2018-01-12 DIAGNOSIS — R2681 Unsteadiness on feet: Secondary | ICD-10-CM | POA: Diagnosis not present

## 2018-01-12 DIAGNOSIS — R278 Other lack of coordination: Secondary | ICD-10-CM | POA: Diagnosis not present

## 2018-01-12 DIAGNOSIS — M6281 Muscle weakness (generalized): Secondary | ICD-10-CM | POA: Diagnosis not present

## 2018-01-12 DIAGNOSIS — R41844 Frontal lobe and executive function deficit: Secondary | ICD-10-CM | POA: Diagnosis not present

## 2018-01-13 DIAGNOSIS — M6281 Muscle weakness (generalized): Secondary | ICD-10-CM | POA: Diagnosis not present

## 2018-01-13 DIAGNOSIS — R41844 Frontal lobe and executive function deficit: Secondary | ICD-10-CM | POA: Diagnosis not present

## 2018-01-13 DIAGNOSIS — R278 Other lack of coordination: Secondary | ICD-10-CM | POA: Diagnosis not present

## 2018-01-13 DIAGNOSIS — R2681 Unsteadiness on feet: Secondary | ICD-10-CM | POA: Diagnosis not present

## 2018-01-17 DIAGNOSIS — M6281 Muscle weakness (generalized): Secondary | ICD-10-CM | POA: Diagnosis not present

## 2018-01-17 DIAGNOSIS — R278 Other lack of coordination: Secondary | ICD-10-CM | POA: Diagnosis not present

## 2018-01-17 DIAGNOSIS — R2681 Unsteadiness on feet: Secondary | ICD-10-CM | POA: Diagnosis not present

## 2018-01-17 DIAGNOSIS — R41844 Frontal lobe and executive function deficit: Secondary | ICD-10-CM | POA: Diagnosis not present

## 2018-01-18 DIAGNOSIS — R278 Other lack of coordination: Secondary | ICD-10-CM | POA: Diagnosis not present

## 2018-01-18 DIAGNOSIS — R41844 Frontal lobe and executive function deficit: Secondary | ICD-10-CM | POA: Diagnosis not present

## 2018-01-18 DIAGNOSIS — M6281 Muscle weakness (generalized): Secondary | ICD-10-CM | POA: Diagnosis not present

## 2018-01-18 DIAGNOSIS — R2681 Unsteadiness on feet: Secondary | ICD-10-CM | POA: Diagnosis not present

## 2018-01-20 DIAGNOSIS — R41844 Frontal lobe and executive function deficit: Secondary | ICD-10-CM | POA: Diagnosis not present

## 2018-01-20 DIAGNOSIS — M25551 Pain in right hip: Secondary | ICD-10-CM | POA: Diagnosis not present

## 2018-01-20 DIAGNOSIS — R2681 Unsteadiness on feet: Secondary | ICD-10-CM | POA: Diagnosis not present

## 2018-01-20 DIAGNOSIS — M6281 Muscle weakness (generalized): Secondary | ICD-10-CM | POA: Diagnosis not present

## 2018-01-20 DIAGNOSIS — M5489 Other dorsalgia: Secondary | ICD-10-CM | POA: Diagnosis not present

## 2018-01-20 DIAGNOSIS — R278 Other lack of coordination: Secondary | ICD-10-CM | POA: Diagnosis not present

## 2018-01-21 DIAGNOSIS — R278 Other lack of coordination: Secondary | ICD-10-CM | POA: Diagnosis not present

## 2018-01-21 DIAGNOSIS — M6281 Muscle weakness (generalized): Secondary | ICD-10-CM | POA: Diagnosis not present

## 2018-01-21 DIAGNOSIS — R2681 Unsteadiness on feet: Secondary | ICD-10-CM | POA: Diagnosis not present

## 2018-01-21 DIAGNOSIS — R41844 Frontal lobe and executive function deficit: Secondary | ICD-10-CM | POA: Diagnosis not present

## 2018-01-24 DIAGNOSIS — M5410 Radiculopathy, site unspecified: Secondary | ICD-10-CM | POA: Diagnosis not present

## 2018-01-24 DIAGNOSIS — M6281 Muscle weakness (generalized): Secondary | ICD-10-CM | POA: Diagnosis not present

## 2018-01-24 DIAGNOSIS — R278 Other lack of coordination: Secondary | ICD-10-CM | POA: Diagnosis not present

## 2018-01-24 DIAGNOSIS — R2681 Unsteadiness on feet: Secondary | ICD-10-CM | POA: Diagnosis not present

## 2018-01-24 DIAGNOSIS — R41844 Frontal lobe and executive function deficit: Secondary | ICD-10-CM | POA: Diagnosis not present

## 2018-01-25 DIAGNOSIS — M6281 Muscle weakness (generalized): Secondary | ICD-10-CM | POA: Diagnosis not present

## 2018-01-25 DIAGNOSIS — R278 Other lack of coordination: Secondary | ICD-10-CM | POA: Diagnosis not present

## 2018-01-25 DIAGNOSIS — R2681 Unsteadiness on feet: Secondary | ICD-10-CM | POA: Diagnosis not present

## 2018-01-25 DIAGNOSIS — R41844 Frontal lobe and executive function deficit: Secondary | ICD-10-CM | POA: Diagnosis not present

## 2018-01-27 DIAGNOSIS — M6281 Muscle weakness (generalized): Secondary | ICD-10-CM | POA: Diagnosis not present

## 2018-01-27 DIAGNOSIS — R41844 Frontal lobe and executive function deficit: Secondary | ICD-10-CM | POA: Diagnosis not present

## 2018-01-27 DIAGNOSIS — R2681 Unsteadiness on feet: Secondary | ICD-10-CM | POA: Diagnosis not present

## 2018-01-27 DIAGNOSIS — R278 Other lack of coordination: Secondary | ICD-10-CM | POA: Diagnosis not present

## 2018-01-28 DIAGNOSIS — R2681 Unsteadiness on feet: Secondary | ICD-10-CM | POA: Diagnosis not present

## 2018-01-28 DIAGNOSIS — R41844 Frontal lobe and executive function deficit: Secondary | ICD-10-CM | POA: Diagnosis not present

## 2018-01-28 DIAGNOSIS — M6281 Muscle weakness (generalized): Secondary | ICD-10-CM | POA: Diagnosis not present

## 2018-01-28 DIAGNOSIS — R278 Other lack of coordination: Secondary | ICD-10-CM | POA: Diagnosis not present

## 2018-01-31 DIAGNOSIS — M6281 Muscle weakness (generalized): Secondary | ICD-10-CM | POA: Diagnosis not present

## 2018-01-31 DIAGNOSIS — R278 Other lack of coordination: Secondary | ICD-10-CM | POA: Diagnosis not present

## 2018-01-31 DIAGNOSIS — R41844 Frontal lobe and executive function deficit: Secondary | ICD-10-CM | POA: Diagnosis not present

## 2018-01-31 DIAGNOSIS — R2681 Unsteadiness on feet: Secondary | ICD-10-CM | POA: Diagnosis not present

## 2018-02-01 DIAGNOSIS — R2681 Unsteadiness on feet: Secondary | ICD-10-CM | POA: Diagnosis not present

## 2018-02-01 DIAGNOSIS — M6281 Muscle weakness (generalized): Secondary | ICD-10-CM | POA: Diagnosis not present

## 2018-02-01 DIAGNOSIS — R278 Other lack of coordination: Secondary | ICD-10-CM | POA: Diagnosis not present

## 2018-02-01 DIAGNOSIS — R41844 Frontal lobe and executive function deficit: Secondary | ICD-10-CM | POA: Diagnosis not present

## 2018-04-03 DEATH — deceased
